# Patient Record
Sex: Female | Born: 1935 | Race: White | Hispanic: No | Smoking: Former smoker
Health system: Southern US, Community
[De-identification: ages and names within clinical notes are randomized; demographics above are authoritative.]

## PROBLEM LIST (undated history)

## (undated) DIAGNOSIS — K5790 Diverticulosis of intestine, part unspecified, without perforation or abscess without bleeding: Secondary | ICD-10-CM

## (undated) DIAGNOSIS — N952 Postmenopausal atrophic vaginitis: Secondary | ICD-10-CM

## (undated) DIAGNOSIS — K219 Gastro-esophageal reflux disease without esophagitis: Secondary | ICD-10-CM

## (undated) DIAGNOSIS — Z780 Asymptomatic menopausal state: Secondary | ICD-10-CM

## (undated) DIAGNOSIS — M549 Dorsalgia, unspecified: Secondary | ICD-10-CM

## (undated) DIAGNOSIS — M810 Age-related osteoporosis without current pathological fracture: Secondary | ICD-10-CM

## (undated) DIAGNOSIS — E079 Disorder of thyroid, unspecified: Secondary | ICD-10-CM

## (undated) DIAGNOSIS — Z9289 Personal history of other medical treatment: Secondary | ICD-10-CM

## (undated) DIAGNOSIS — I6529 Occlusion and stenosis of unspecified carotid artery: Secondary | ICD-10-CM

## (undated) DIAGNOSIS — J189 Pneumonia, unspecified organism: Secondary | ICD-10-CM

## (undated) DIAGNOSIS — R42 Dizziness and giddiness: Secondary | ICD-10-CM

## (undated) DIAGNOSIS — E039 Hypothyroidism, unspecified: Secondary | ICD-10-CM

## (undated) DIAGNOSIS — D649 Anemia, unspecified: Secondary | ICD-10-CM

## (undated) DIAGNOSIS — N390 Urinary tract infection, site not specified: Secondary | ICD-10-CM

## (undated) DIAGNOSIS — R32 Unspecified urinary incontinence: Secondary | ICD-10-CM

## (undated) HISTORY — DX: Age-related osteoporosis without current pathological fracture: M81.0

## (undated) HISTORY — DX: Unspecified urinary incontinence: R32

## (undated) HISTORY — PX: TONSILLECTOMY: SUR1361

## (undated) HISTORY — PX: EYE SURGERY: SHX253

## (undated) HISTORY — PX: CATARACT EXTRACTION W/ INTRAOCULAR LENS  IMPLANT, BILATERAL: SHX1307

## (undated) HISTORY — DX: Postmenopausal atrophic vaginitis: N95.2

## (undated) HISTORY — DX: Asymptomatic menopausal state: Z78.0

---
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
## 2001-02-05 ENCOUNTER — Other Ambulatory Visit: Admission: RE | Admit: 2001-02-05 | Discharge: 2001-02-05 | Payer: Self-pay | Admitting: Obstetrics and Gynecology

## 2001-09-30 ENCOUNTER — Ambulatory Visit (HOSPITAL_COMMUNITY): Admission: RE | Admit: 2001-09-30 | Discharge: 2001-09-30 | Payer: Self-pay | Admitting: Gastroenterology

## 2001-09-30 ENCOUNTER — Encounter (INDEPENDENT_AMBULATORY_CARE_PROVIDER_SITE_OTHER): Payer: Self-pay | Admitting: *Deleted

## 2001-09-30 HISTORY — PX: COLONOSCOPY: SHX174

## 2005-08-19 ENCOUNTER — Encounter: Payer: Self-pay | Admitting: Internal Medicine

## 2006-02-02 ENCOUNTER — Ambulatory Visit (HOSPITAL_COMMUNITY): Admission: RE | Admit: 2006-02-02 | Discharge: 2006-02-02 | Payer: Self-pay | Admitting: Ophthalmology

## 2008-02-03 ENCOUNTER — Ambulatory Visit: Payer: Self-pay | Admitting: Surgery

## 2008-02-29 ENCOUNTER — Ambulatory Visit: Payer: Self-pay | Admitting: Vascular Surgery

## 2009-02-27 ENCOUNTER — Ambulatory Visit: Payer: Self-pay | Admitting: Vascular Surgery

## 2009-08-28 ENCOUNTER — Encounter (HOSPITAL_BASED_OUTPATIENT_CLINIC_OR_DEPARTMENT_OTHER): Admission: RE | Admit: 2009-08-28 | Discharge: 2009-10-03 | Payer: Self-pay | Admitting: Internal Medicine

## 2010-03-12 ENCOUNTER — Ambulatory Visit: Payer: Self-pay | Admitting: Vascular Surgery

## 2010-05-07 ENCOUNTER — Ambulatory Visit: Payer: Self-pay | Admitting: Internal Medicine

## 2010-05-07 DIAGNOSIS — R195 Other fecal abnormalities: Secondary | ICD-10-CM | POA: Insufficient documentation

## 2010-05-07 DIAGNOSIS — D509 Iron deficiency anemia, unspecified: Secondary | ICD-10-CM | POA: Insufficient documentation

## 2010-05-07 LAB — CONVERTED CEMR LAB
Basophils Absolute: 0 10*3/uL (ref 0.0–0.1)
Basophils Relative: 0.1 % (ref 0.0–3.0)
Eosinophils Absolute: 0.2 10*3/uL (ref 0.0–0.7)
Eosinophils Relative: 3.4 % (ref 0.0–5.0)
HCT: 39.1 % (ref 36.0–46.0)
Lymphocytes Relative: 22.1 % (ref 12.0–46.0)
Lymphs Abs: 1.5 10*3/uL (ref 0.7–4.0)
Monocytes Absolute: 0.8 10*3/uL (ref 0.1–1.0)
Monocytes Relative: 11.8 % (ref 3.0–12.0)
Neutro Abs: 4.2 10*3/uL (ref 1.4–7.7)
Neutrophils Relative %: 62.6 % (ref 43.0–77.0)
Platelets: 279 10*3/uL (ref 150.0–400.0)
RBC: 4.12 M/uL (ref 3.87–5.11)

## 2010-06-25 ENCOUNTER — Ambulatory Visit: Payer: Self-pay | Admitting: Internal Medicine

## 2010-06-25 DIAGNOSIS — K5520 Angiodysplasia of colon without hemorrhage: Secondary | ICD-10-CM | POA: Insufficient documentation

## 2010-10-22 NOTE — Procedures (Signed)
Summary: Colonoscopy-Dr. Sherin Quarry   Colonoscopy  Procedure date:  09/30/2001  Findings:      Location:  [facility].  Angiodysplasia Results: Diverticulosis.         Procedures Next Due Date:    Colonoscopy: 09/2006  Colonoscopy  Procedure date:  09/30/2001  Findings:      Location:  [facility].  Angiodysplasia Results: Diverticulosis.         Procedures Next Due Date:    Colonoscopy: 09/2006 Patient Name: Teresa, Callahan MRN: [id number] Procedure Procedures: Colonoscopy CPT: 14782.  Personnel: Endoscopist: Genene Churn. Sherin Quarry, MD.  Referred By: Teena Irani. Arlyce Dice, MD.  Exam Location: Exam performed in Endoscopy Suite. Outpatient  Patient Consent: Procedure, Alternatives, Risks and Benefits discussed, consent obtained,  Indications  Increased Risk Screening: For family history of colorectal neoplasia, in  parent age at onset: 50.  History Allergies: Allergic to codeine, Premarin.  Comments: Other diagnoses: hypothyroidism, postmenopausal, asthma Current medications: Synthroid, HRT, Advair Pre-Exam Physical: Performed Sep 30, 2001. Cardio-pulmonary exam, Abdominal exam, Mental status exam WNL.  Exam Exam: Extent of exam reached: Cecum, extent intended: Cecum.  Colon retroflexion performed. Images taken. ASA Classification: II.  Monitoring: Pulse and BP monitoring, Oximetry used. Supplemental O2 given.  Colon Prep Used Golytely for colon prep. Prep results: excellent.  Fluoroscopy: Fluoroscopy was not used.  Sedation Meds: Demerol 50 mg. given IV. Versed 5 mg. given IV.  Findings ANGIODYSPLASIA (AVMs): 2 AVMs, maximum size 5 mm, non-bleeding in Cecum. ICD9: Angiodysplasia without Hemorrhage: 569.84. Comments: Image taken.  - DIVERTICULOSIS: Descending Colon to Sigmoid Colon. Not bleeding. ICD9: Diverticulosis, Colon: 562.10. Comments: No evidence of stricture, diverticulitis or active bleeding.    Comments: No tumors, polyps or  neoplastic lesions were seen. Assessment Abnormal examination, see findings above.  Diagnoses: 569.84: Angiodysplasia without Hemorrhage.  562.10: Diverticulosis, Colon.   Events  Unplanned Interventions: No intervention was required.  Unplanned Events: There were no complications. Plans Comments: Based on her family history and the fact that she has several AVMs in her cecum, I would recommend a repeat colonoscopy in 3-5 years, depending upon her overall health. Disposition: After procedure patient sent to recovery.   This report was created from the original endoscopy report, which was reviewed and signed by the above listed endoscopist.   cc:  Dara Hoyer, MD

## 2010-10-22 NOTE — Procedures (Signed)
Summary: Colonoscopy/[facility]  Colonoscopy/[facility]   Imported By: Sherian Rein 05/16/2010 07:54:41  _____________________________________________________________________  External Attachment:    Type:   Image     Comment:   External Document

## 2010-10-22 NOTE — Assessment & Plan Note (Signed)
Summary: Anemia iron def, hem pos stools (new patient)   History of Present Illness Visit Type: Initial Consult Primary GI MD: Yancey Flemings MD Primary Noam Karaffa: Buren Kos, MD Requesting Brayton Baumgartner: Buren Kos, MD Chief Complaint: Iron def anemia & hem positive stools History of Present Illness:   Soon to be 77 year old white female with asthma, hypothyroidism, carotid stenosis, chronic vertigo, GERD, and osteopenia. She is sent today regarding iron deficiency anemia and Hemoccult-positive stool. The patient was previously under the care of Dr. Tasia Catchings and tells me that she has had previous colonoscopies. She denies a history of polyps. Records to be requested. Recent evaluation with Dr. Clelia Croft for complaints of fatigue revealed anemia with a hemoglobin of 10.4. MCV 91. Iron saturation and ferritin low at 9.2% and 11.4 respectively. These were obtained may 2011. Hemoccult testing return positive. The patient has been on iron. Her stools have been dark on iron. She denies obvious bleeding. She does report decreased appetite and weight loss of about 15-17 pounds over the past year. She also reports change in bowel habits with more pasty appearing stools about 3 times per week. Occasional diarrhea for which she takes Imodium. These bowel habit changes have occurred over the past year. She does take baby aspirin 3 times per week and Aleve several times per week. She denies upper GI symptoms such as dysphagia. No abnormal pain.   GI Review of Systems    Reports acid reflux and  weight loss.      Denies abdominal pain, belching, bloating, chest pain, dysphagia with liquids, dysphagia with solids, heartburn, loss of appetite, nausea, vomiting, vomiting blood, and  weight gain.      Reports diarrhea and  heme positive stool.     Denies anal fissure, black tarry stools, change in bowel habit, constipation, diverticulosis, fecal incontinence, hemorrhoids, irritable bowel syndrome, jaundice, light color  stool, liver problems, rectal bleeding, and  rectal pain. Preventive Screening-Counseling & Management  Alcohol-Tobacco     Smoking Status: quit      Drug Use:  no.      Current Medications (verified): 1)  Synthroid 75 Mcg Tabs (Levothyroxine Sodium) .Marland Kitchen.. 1 By Mouth Once Daily 2)  Align  Caps (Probiotic Product) .Marland Kitchen.. 1 By Mouth Once Daily 3)  Qvar 40 Mcg/act Aers (Beclomethasone Dipropionate) .... Use As Directed Two Times A Day 4)  Ventolin Hfa 108 (90 Base) Mcg/act Aers (Albuterol Sulfate) .... Use As Directed Every 6 Hours As Needed 5)  Aspirin 81 Mg Tbec (Aspirin) .Marland Kitchen.. 1 Tablet By Mouth Three Times Week 6)  Caltrate 600+d Plus 600-400 Mg-Unit Tabs (Calcium Carbonate-Vit D-Min) .Marland Kitchen.. 1 By Mouth Once Daily 7)  Centrum  Tabs (Multiple Vitamins-Minerals) .Marland Kitchen.. 1 By Mouth Once Daily 8)  Prevacid 24hr 15 Mg Cpdr (Lansoprazole) .... Take 1 Capsule By Mouth Once Daily 9)  Viactiv Multi-Vitamin  Chew (Multiple Vitamins-Calcium) .Marland Kitchen.. 1 By Mouth Once Daily 10)  Vitamin C 500 Mg Tabs (Ascorbic Acid) .... Once Daily 11)  Feosol 45 Mg Tabs (Iron) .... Once Daily 12)  Aleve 220 Mg Tabs (Naproxen Sodium) .... As Needed  Allergies (verified): 1)  ! * Horse Serum  Past History:  Past Medical History: Asthma Hypothyroidism Ostropenia Carotid Stenosis Chronic Vertigo Anemia GERD  Past Surgical History: Hysterectomy  Family History: Family History of Stomach Cancer:Mother Melenoma: Father  Social History: Occupation: Retired Patient is a former smoker.  Alcohol Use - yes 2 Daily Caffeine Use 2 Illicit Drug Use - no Smoking Status:  quit  Drug Use:  no  Review of Systems       The patient complains of arthritis/joint pain, muscle pains/cramps, urination - excessive, and urine leakage.  The patient denies allergy/sinus, anemia, anxiety-new, back pain, blood in urine, breast changes/lumps, change in vision, confusion, cough, coughing up blood, depression-new, fainting, fatigue, fever,  headaches-new, hearing problems, heart murmur, heart rhythm changes, itching, menstrual pain, night sweats, nosebleeds, pregnancy symptoms, shortness of breath, skin rash, sleeping problems, sore throat, swelling of feet/legs, swollen lymph glands, thirst - excessive , urination - excessive , urination changes/pain, vision changes, and voice change.    Vital Signs:  Patient profile:   77 year old female Height:      61.5 inches Weight:      118.13 pounds BMI:     22.04 Pulse rate:   56 / minute Pulse rhythm:   regular BP sitting:   96 / 54  (left arm) Cuff size:   regular  Vitals Entered By: June McMurray CMA Duncan Dull) (May 07, 2010 9:36 AM)  Physical Exam  General:  Well developed, well nourished elderly female, no acute distress. Head:  Normocephalic and atraumatic. Eyes:  anicteric Nose:  No deformity, discharge,  or lesions. Mouth:  No deformity or lesions, Neck:  Supple; no masses or thyromegaly. Lungs:  Clear throughout to auscultation. Heart:  Regular rate and rhythm; no murmurs, rubs,  or bruits. Abdomen:  Soft, nontender and nondistended. No masses, hepatosplenomegaly or hernias noted. Normal bowel sounds. Rectal:  deferred Msk:  Symmetrical with no gross deformities. kyphotic posture Pulses:  Normal pulses noted. Extremities:  No edema or deformities noted. Neurologic:  Alert and  oriented x4;  grossly normal neurologically.   Impression & Recommendations:  Problem # 1:  ANEMIA, IRON DEFICIENCY (ICD-280.9) iron deficiency anemia with Hemoccult-positive stool. Rule out occult GI lesion such as erosions, ulcer, AVMs, or neoplasia. Weight loss somewhat concerning, though they be nonspecific in a 77 year old. We had a long discussion today regarding the possible causes for iron deficiency anemia and Hemoccult-positive stool. We also discussed the workup, including colonoscopy and upper endoscopy. The nature of the procedures as well as the risks, benefits, and alternatives  were reviewed. The discussion regarding these areas took greater than 30 minutes. She was quite apprehensive to have anything done, stating her age. I was sympathetic to this, but did emphasize that we could find easily treatable problems. At the end of the day, we agreed on the following:  Plan: #1. Followup CBC today #2. Obtain outside records from Dr. Sherin Quarry for review #3. GI followup with me in 2-4 weeks. She will consider repeat endoscopic evaluations in the interim #4. Continue iron  Problem # 2:  FECAL OCCULT BLOOD (ICD-792.1) see above discussion. Continue empirically started PPI daily  Other Orders: TLB-CBC Platelet - w/Differential (85025-[id number])  Patient Instructions: 1)  CBC ordered for you to go to basement floor and have drawn today. 2)  We will send ROI to Dr. Sherin Quarry to get old records. 3)  Please schedule a follow-up appointment in 2-4 weeks.  4)  The medication list was reviewed and reconciled.  All changed / newly prescribed medications were explained.  A complete medication list was provided to the patient / caregiver.  Appended Document: Anemia iron def, hem pos stools (new patient) COPY: Dr. Sandra Cockayne

## 2010-10-22 NOTE — Assessment & Plan Note (Signed)
Summary: Followup-iron deficiency anemia and Hemoccult-positive stool   History of Present Illness Visit Type: Follow-up Visit Primary GI MD: Yancey Flemings MD Primary Provider: Buren Kos, MD Requesting Provider: Buren Kos, MD Chief Complaint: anemia- 4 week F/U History of Present Illness:   Pleasant 77 year old white female with asthma, hypothyroidism, carotid stenosis, chronic vertigo, GERD, and osteopenia. She was evaluated in this office May 07, 2010 regarding iron deficiency anemia and Hemoccult positive stool. See that dictation for details. She had been initiated on iron therapy prior to the visit. Followup CBC that day revealed a normal hemoglobin of 13.3 with a normal MCV of 94.9. We were able to obtain outside records from Dr. Barnett Abu for review. The patient presents for followup at this time. She has continued on iron therapy. Absolutely no GI or other complaints, save urinary difficulties. Review of prior colonoscopy from 2003 and again in 2006 revealed no evidence of neoplasia. However, both reports clearly documented multiple vascular malformations of the colon in the cecum. The patient remains steadfast that she is not interested in procedural work, if at all possible.   GI Review of Systems      Denies abdominal pain, acid reflux, belching, bloating, chest pain, dysphagia with liquids, dysphagia with solids, heartburn, loss of appetite, nausea, vomiting, vomiting blood, weight loss, and  weight gain.      Reports heme positive stool.     Denies anal fissure, black tarry stools, change in bowel habit, constipation, diarrhea, diverticulosis, fecal incontinence, hemorrhoids, irritable bowel syndrome, jaundice, light color stool, liver problems, rectal bleeding, and  rectal pain.    Current Medications (verified): 1)  Synthroid 75 Mcg Tabs (Levothyroxine Sodium) .Marland Kitchen.. 1 By Mouth Once Daily 2)  Align  Caps (Probiotic Product) .Marland Kitchen.. 1 By Mouth Once Daily 3)  Qvar 40 Mcg/act Aers  (Beclomethasone Dipropionate) .... Use As Directed Two Times A Day 4)  Ventolin Hfa 108 (90 Base) Mcg/act Aers (Albuterol Sulfate) .... Use As Directed Every 6 Hours As Needed 5)  Aspirin 81 Mg Tbec (Aspirin) .Marland Kitchen.. 1 Tablet By Mouth Three Times Week Hold 6)  Caltrate 600+d Plus 600-400 Mg-Unit Tabs (Calcium Carbonate-Vit D-Min) .Marland Kitchen.. 1 By Mouth Once Daily 7)  Centrum  Tabs (Multiple Vitamins-Minerals) .Marland Kitchen.. 1 By Mouth Once Daily 8)  Prevacid 24hr 15 Mg Cpdr (Lansoprazole) .... Take 1 Capsule By Mouth Once Daily 9)  Viactiv Multi-Vitamin  Chew (Multiple Vitamins-Calcium) .Marland Kitchen.. 1 By Mouth Once Daily 10)  Vitamin C 500 Mg Tabs (Ascorbic Acid) .... Once Daily 11)  Feosol 45 Mg Tabs (Iron) .... Once Daily 12)  Aleve 220 Mg Tabs (Naproxen Sodium) .... As Needed  Allergies (verified): 1)  ! * Horse Serum  Past History:  Past Medical History: Reviewed history from 05/07/2010 and no changes required. Asthma Hypothyroidism Ostropenia Carotid Stenosis Chronic Vertigo Anemia GERD  Past Surgical History: Reviewed history from 05/07/2010 and no changes required. Hysterectomy  Family History: Reviewed history from 05/07/2010 and no changes required. Family History of Stomach Cancer:Mother Melenoma: Father  Social History: Reviewed history from 05/07/2010 and no changes required. Occupation: Retired Patient is a former smoker.  Alcohol Use - yes 2 Daily Caffeine Use 2 Illicit Drug Use - no  Review of Systems       The patient complains of anemia, arthritis/joint pain, muscle pains/cramps, urination - excessive, urination changes/pain, and urine leakage.  The patient denies allergy/sinus, anxiety-new, back pain, blood in urine, breast changes/lumps, change in vision, confusion, cough, coughing up blood, depression-new, fainting, fatigue, fever,  headaches-new, hearing problems, heart murmur, heart rhythm changes, itching, menstrual pain, night sweats, nosebleeds, pregnancy symptoms, shortness  of breath, skin rash, sleeping problems, sore throat, swelling of feet/legs, swollen lymph glands, thirst - excessive , urination - excessive , vision changes, and voice change.    Vital Signs:  Patient profile:   77 year old female Height:      61.5 inches Weight:      119.13 pounds BMI:     22.22 Pulse rate:   60 / minute Pulse rhythm:   regular BP sitting:   108 / 60  (left arm) Cuff size:   regular  Vitals Entered By: June McMurray CMA Duncan Dull) (June 25, 2010 11:23 AM)  Physical Exam  General:  Well developed, well nourished, no acute distress. Head:  Normocephalic and atraumatic. Eyes:  PERRLA, no icterus. Abdomen:  not reexamined Pulses:  Normal pulses noted. Neurologic:  Alert and  oriented x4. Skin:  Intact without significant lesions or rashes. Psych:  Alert and cooperative. Normal mood and affect.   Impression & Recommendations:  Problem # 1:  ANEMIA, IRON DEFICIENCY (ICD-51.46) 77 year old female who was seen recently for asymptomatic mild iron deficiency anemia and Hemoccult-positive stool. Good response to supplementation. Review of outside records reveals colonic AVMs. No neoplasia. AVMs the most likely reason for her iron deficiency anemia and Hemoccult-positive stool. At this point, we mutually agreed not to pursue the issue further given her good response time, lack of symptoms, age, and prior evaluations. She is pleased.  Plan: #1. Continue iron supplementation indefinitely #2. Return to the care of Dr. Clelia Croft. He can check blood counts periodically to assure sustained response to chronic iron #3. GI followup p.r.n.  Problem # 2:  FECAL OCCULT BLOOD (ICD-792.1) please see above discussion  Problem # 3:  ANGIODYSPLASIA-INTESTINE (TKZ-601.09) documented colonic AVMs on 2 prior colonoscopies, most recent 2006. Relevance to anemia and Hemoccult-positive stool as described to #1 above. Would only need to consider endoscopic ablative therapy if she fails to respond  and requiring significant transfusions or developed overt bleeding issues.  Patient Instructions: 1)  Please schedule a follow-up appointment as needed.  2)  Copy sent to : Buren Kos, MD 3)  The medication list was reviewed and reconciled.  All changed / newly prescribed medications were explained.  A complete medication list was provided to the patient / caregiver.

## 2011-02-04 NOTE — Assessment & Plan Note (Signed)
OFFICE VISIT   Teresa Callahan, Teresa Callahan  DOB:  1918/08/06                                       02/27/2009  NFAOZ#:[id number]   The patient returns for her annual check of her carotid arteries.  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  She is  essentially asymptomatic with the exception of very rare episodes of  vertigo which only have happened 2 or 3 times.  She has no hemiparesis,  aphasia, amaurosis fugax or diplopia and no syncopal episodes.  She also  has had no headaches.  She denies any chest pain, dyspnea on exertion or  claudication.  She is taking one aspirin about three times per week.   PHYSICAL EXAM:  Blood pressure 157/75, heart rate 80, respirations 12.  Her carotid pulses are 3+ bilaterally.  No bruits are heard.  Neurologic  exam is normal.  Chest clear to auscultation.  Lower extremity pulses  are 3+ at the femoral, popliteal and dorsalis pedis level.   Carotid duplex exam is unchanged with very mild left internal carotid  flow reduction.  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   We will follow her on an annual basis on the carotid protocol, check the  progression of disease on the left side and if she has any symptoms she  will notify me to see [place] at an earlier time.   Quita Skye Hart [place], M.D.  Electronically Signed   JDL/MEDQ  D:  02/27/2009  T:  02/28/2009  Job:  [id number]   cc:   Kari Baars, M.D.

## 2011-02-04 NOTE — Procedures (Signed)
CAROTID DUPLEX EXAM   INDICATION:  Carotid bruit.   HISTORY:  Diabetes:  No.  Cardiac:  No.  Hypertension:  Yes.  Smoking:  Quit over 25 years ago.  Previous Surgery:  No.  CV History:  Patient complains of left ear tinnitus.  According to the  patient, she had a previous duplex five years ago which showed a  significant amount of blockage on the right side.  Amaurosis Fugax No, Paresthesias No, Hemiparesis No                                       RIGHT             LEFT  Brachial systolic pressure:         128               126  Brachial Doppler waveforms:         Triphasic         Triphasic  Vertebral direction of flow:        Antegrade         Antegrade  DUPLEX VELOCITIES (cm/sec)  CCA peak systolic                   56                76  ECA peak systolic                   67                190  ICA peak systolic                   Occluded          97  ICA end diastolic                   Occluded          22  PLAQUE MORPHOLOGY:                  Mixed             Mixed  PLAQUE AMOUNT:                      Severe            Mild  PLAQUE LOCATION:                    Throughout ICA    Proximal ICA   IMPRESSION:  1. Cannot rule out right internal carotid artery occlusion versus      trickle flow; however, no flow was identified within the right      internal carotid artery.  2. 20-39% left internal carotid artery stenosis.   ___________________________________________  V. Charlena Cross, MD   MC/MEDQ  D:  02/03/2008  T:  02/03/2008  Job:  [id number]

## 2011-02-04 NOTE — Procedures (Signed)
CAROTID DUPLEX EXAM   INDICATION:  Carotid disease.   HISTORY:  Diabetes:  no  Cardiac:  no  Hypertension:  yes  Smoking:  previous  Previous Surgery:  no  CV History:  The patient states they hear their heartbeat in their ears  at night  Amaurosis Fugax No, Paresthesias No, Hemiparesis No                                       RIGHT             LEFT  Brachial systolic pressure:         140               128  Brachial Doppler waveforms:         normal            normal  Vertebral direction of flow:        Antegrade         Antegrade  DUPLEX VELOCITIES (cm/sec)  CCA peak systolic                   64 (resistive)    75  ECA peak systolic                   164               335  ICA peak systolic                   No flow detected  95  ICA end diastolic                                     23  PLAQUE MORPHOLOGY:                  mixed             mixed  PLAQUE AMOUNT:                      occlusive         mild/ moderate  PLAQUE LOCATION:                    ICA / ECA / CCA   ICA / ECA / CCA   IMPRESSION:  1. No significant flow patterns were detected in the right internal      carotid artery which is suggestive of a total occlusion.  2. No hemodynamically significant stenosis of the left internal      carotid artery.  3. Left external carotid artery stenosis noted.  4. No significant change in Doppler velocities when compared to the      previous exam on 02/27/2009.         ___________________________________________  Quita Skye. Hart [place], M.D.   CH/MEDQ  D:  03/13/2010  T:  03/13/2010  Job:  [id number]

## 2011-02-04 NOTE — Procedures (Signed)
CAROTID DUPLEX EXAM   INDICATION:  Follow up carotid artery disease.   HISTORY:  Diabetes:  No.  Cardiac:  No.  Hypertension:  Yes.  Smoking:  Previous.  Previous Surgery:  No.  CV History:  No, intermittent mild vertigo hears heartbeat in ears at  night.  Amaurosis Fugax No, Paresthesias No, Hemiparesis No.                                       RIGHT             LEFT  Brachial systolic pressure:         132               130  Brachial Doppler waveforms:         WNL               WNL  Vertebral direction of flow:        Antegrade         Antegrade  DUPLEX VELOCITIES (cm/sec)  CCA peak systolic                   68 (high resistance)                91  ECA peak systolic                   110               236  ICA peak systolic                   365, then 22      110  ICA end diastolic                   55, then 7        22  PLAQUE MORPHOLOGY:                  Mixed             Mixed  PLAQUE AMOUNT:                      Severe            Mild  PLAQUE LOCATION:                    ICA               ICA/ECA   IMPRESSION:  1. Right internal carotid artery shows evidence of string sign in      proximal/mid segment.  Unable to visualize flow in origin/proximal      and distal segments.  Cannot rule out occlusion.  2. Left internal carotid artery shows evidence of 20-39% stenosis.  3. Left external carotid artery stenosis.   ___________________________________________  Quita Skye Hart [place], M.D.   AS/MEDQ  D:  02/27/2009  T:  02/27/2009  Job:  [id number]

## 2011-02-04 NOTE — Consult Note (Signed)
VASCULAR SURGERY CONSULTATION   PEIGHTON, MEHRA  DOB:  Oct 26, 1917                                       02/29/2008  ZOXWR#:[id number]   The patient is a pleasant 77 year old female who is referred for  vascular consultation by Dr. Clelia Croft for carotid occlusive disease.  She  denies any history of stroke, hemispheric or non-hemispheric TIAs,  amaurosis fugax, blurred vision or syncope.  She does occasionally have  dizziness at night when arising to go to the bathroom, but this  resolves.  She was found to have some right carotid occlusive disease in  the past on ultrasound study several years ago.  She had a repeat study  May 14th of 2099 in our vascular lab ordered by Dr. Clelia Croft following the  dizziness, and this revealed total occlusion of her right internal  carotid artery and mild left internal carotid flow reduction.  She does  complain of some occasional left ear tinnitus.   PAST MEDICAL HISTORY:  Negative for diabetes, hypertension, coronary  artery disease or stroke.  She does have a history hyperlipidemia and  asthma.   PAST SURGICAL HISTORY:  1. Hysterectomy.  2. Bilateral cataract surgery.   FAMILY HISTORY:  Negative for coronary artery disease, diabetes and  stroke with a strong family history of cancer.   SOCIAL HISTORY:  She is widowed and has five children, is retired, and  lives alone.  She is independent, drives an automobile, does not use  tobacco.  Uses alcohol occasionally.   REVIEW OF SYSTEMS:  She does have urinary incontinence.  Denies any  chest pain, dyspnea on exertion, PND, orthopnea, weight loss, anorexia,  severe pulmonary problems, and negative for GI and ortho with the  exception of joint pain.   ALLERGIES:  None known.   MEDICATIONS:  She does take Aleve as well as other medications, but none  no aspirin.   PHYSICAL EXAM:  Blood pressure 120/76, heart rate is 87, respirations  are 14.  Generally, she is alert and oriented  female who appears younger  than her stated age.  She is alert and oriented x3.  Neck:  Supple.  3+  carotid pulse palpable on the left and 2+ on the right.  There is soft  bruit on the left.  Neurologic exam is normal.  No palpable adenopathy  in the neck.  Chest:  Clear to auscultation.  Cardiovascular exam  reveals a regular rhythm with no murmurs.  Abdomen:  Soft, nontender  with no palpable masses.  She has 3+ femoral, popliteal, and 2+ dorsalis  pedis pulses palpable bilaterally.   I reviewed the carotid duplex exam and agree that she does have total  occlusion of her right internal carotid artery with mild contralateral  internal carotid disease.  This will need to be followed closely on the  left since her right side is totally occluded.  No treatment is  necessary for the right side.  I have encouraged her take one aspirin  per day and we will see her in 1 year with followup carotid duplex exam  unless she develops any neurologic symptoms in the interim.   Quita Skye Hart [place], M.D.  Electronically Signed  JDL/MEDQ  D:  02/29/2008  T:  03/01/2008  Job:  [id number]   cc:   Kari Baars, M.D.

## 2011-02-07 NOTE — Op Note (Signed)
NAMEAMYRIA, KOMAR                ACCOUNT NO.:  [id number]   MEDICAL RECORD NO.:  [id number]          PATIENT TYPE:  AMB   LOCATION:  SDS                          FACILITY:  [facility]   PHYSICIAN:  Alford Highland. Rankin, M.D.   DATE OF BIRTH:  1918-02-25   DATE OF PROCEDURE:  02/02/2006  DATE OF DISCHARGE:                                 OPERATIVE REPORT   PREOPERATIVE DIAGNOSIS:  1.  Retained lens fragments - nucleus - nonmagnetic foreign body dislocated      into the posterior chamber - vitreous cavity left eye.  2.  Vitritis secondary to #1.  3.  Aphakia, left eye.   PROCEDURE:  1.  Posterior vitrectomy left eye.  2.  Removal of lens fragments - nonmagnetic foreign body via phaco      fragmentation of the vitreous cavity.  3.  Insertion of posterior chamber intraocular lens into the sulcus.   SURGEON:  Alford Highland. Rankin, M.D.   ANESTHESIA:  Local __________.   INDICATION FOR PROCEDURE:  The patient is an 77 year old woman with a dense  nuclear sclerotic cataract who developed rupture spontaneous of posterior  capsule, wound dislocation of intraocular lens fragments, rather large, in  the vitreous cavity.  Patient understands this is an attempt to remove those  vitreous cavity nonmagnetic foreign bodies as well as to clean out the  cortical material in the anterior chamber as well as the posterior chamber  and the remnants of a lens in the capsular bag.  The patient understands  this is an attempt to place an intraocular lens and understands the nature  of using a large incision, multiple sutures as well as the possibility of  using an anterior chamber lens implant in the left eye.  The patient  understands the risks of anesthesia including reoccurrence, death, loss of  vision to the eye including, but not limited, to hemorrhage, infection,  scarring, need for further surgery, no change in vision, loss of vision,  progression of disease despite intervention.  Informed signed consent was  obtained.  The patient taken to the operating room.  In the operating room,  appropriate monitoring was followed by mild sedation.  __________.   Dictation ended at this point.      Alford Highland Rankin, M.D.  Electronically Signed    GAR/MEDQ  D:  02/02/2006  T:  02/03/2006  Job:  [id number]   cc:   Allen Norris, M.D.  Fax: [phone]

## 2011-03-13 ENCOUNTER — Other Ambulatory Visit: Payer: Self-pay

## 2011-11-24 ENCOUNTER — Other Ambulatory Visit: Payer: Self-pay

## 2011-11-24 ENCOUNTER — Inpatient Hospital Stay (HOSPITAL_COMMUNITY)
Admission: EM | Admit: 2011-11-24 | Discharge: 2011-12-04 | DRG: 871 | Disposition: A | Discharge status: Skilled Nursing Facility | Payer: Medicare Other | Attending: Critical Care Medicine | Admitting: Critical Care Medicine

## 2011-11-24 ENCOUNTER — Inpatient Hospital Stay (HOSPITAL_COMMUNITY): Payer: Medicare Other

## 2011-11-24 ENCOUNTER — Encounter (HOSPITAL_COMMUNITY): Payer: Self-pay | Admitting: Emergency Medicine

## 2011-11-24 ENCOUNTER — Emergency Department (HOSPITAL_COMMUNITY): Payer: Medicare Other

## 2011-11-24 DIAGNOSIS — K219 Gastro-esophageal reflux disease without esophagitis: Secondary | ICD-10-CM | POA: Diagnosis present

## 2011-11-24 DIAGNOSIS — E039 Hypothyroidism, unspecified: Secondary | ICD-10-CM | POA: Diagnosis present

## 2011-11-24 DIAGNOSIS — I5033 Acute on chronic diastolic (congestive) heart failure: Secondary | ICD-10-CM

## 2011-11-24 DIAGNOSIS — E872 Acidosis, unspecified: Secondary | ICD-10-CM

## 2011-11-24 DIAGNOSIS — I2489 Other forms of acute ischemic heart disease: Secondary | ICD-10-CM

## 2011-11-24 DIAGNOSIS — I509 Heart failure, unspecified: Secondary | ICD-10-CM

## 2011-11-24 DIAGNOSIS — A419 Sepsis, unspecified organism: Secondary | ICD-10-CM

## 2011-11-24 DIAGNOSIS — N390 Urinary tract infection, site not specified: Secondary | ICD-10-CM

## 2011-11-24 DIAGNOSIS — R748 Abnormal levels of other serum enzymes: Secondary | ICD-10-CM

## 2011-11-24 DIAGNOSIS — R6520 Severe sepsis without septic shock: Secondary | ICD-10-CM | POA: Diagnosis present

## 2011-11-24 DIAGNOSIS — Z792 Long term (current) use of antibiotics: Secondary | ICD-10-CM

## 2011-11-24 DIAGNOSIS — R579 Shock, unspecified: Secondary | ICD-10-CM

## 2011-11-24 DIAGNOSIS — R195 Other fecal abnormalities: Secondary | ICD-10-CM

## 2011-11-24 DIAGNOSIS — R799 Abnormal finding of blood chemistry, unspecified: Secondary | ICD-10-CM | POA: Diagnosis present

## 2011-11-24 DIAGNOSIS — Z66 Do not resuscitate: Secondary | ICD-10-CM | POA: Diagnosis not present

## 2011-11-24 DIAGNOSIS — N179 Acute kidney failure, unspecified: Secondary | ICD-10-CM

## 2011-11-24 DIAGNOSIS — E860 Dehydration: Secondary | ICD-10-CM

## 2011-11-24 DIAGNOSIS — R918 Other nonspecific abnormal finding of lung field: Secondary | ICD-10-CM

## 2011-11-24 DIAGNOSIS — J9600 Acute respiratory failure, unspecified whether with hypoxia or hypercapnia: Secondary | ICD-10-CM

## 2011-11-24 DIAGNOSIS — K5520 Angiodysplasia of colon without hemorrhage: Secondary | ICD-10-CM

## 2011-11-24 DIAGNOSIS — J45909 Unspecified asthma, uncomplicated: Secondary | ICD-10-CM | POA: Diagnosis present

## 2011-11-24 DIAGNOSIS — I248 Other forms of acute ischemic heart disease: Secondary | ICD-10-CM

## 2011-11-24 DIAGNOSIS — R0902 Hypoxemia: Secondary | ICD-10-CM

## 2011-11-24 DIAGNOSIS — E871 Hypo-osmolality and hyponatremia: Secondary | ICD-10-CM

## 2011-11-24 DIAGNOSIS — D72829 Elevated white blood cell count, unspecified: Secondary | ICD-10-CM

## 2011-11-24 DIAGNOSIS — J189 Pneumonia, unspecified organism: Secondary | ICD-10-CM

## 2011-11-24 DIAGNOSIS — E46 Unspecified protein-calorie malnutrition: Secondary | ICD-10-CM | POA: Diagnosis present

## 2011-11-24 DIAGNOSIS — D509 Iron deficiency anemia, unspecified: Secondary | ICD-10-CM

## 2011-11-24 HISTORY — DX: Anemia, unspecified: D64.9

## 2011-11-24 HISTORY — DX: Dizziness and giddiness: R42

## 2011-11-24 HISTORY — DX: Diverticulosis of intestine, part unspecified, without perforation or abscess without bleeding: K57.90

## 2011-11-24 HISTORY — DX: Disorder of thyroid, unspecified: E07.9

## 2011-11-24 HISTORY — DX: Occlusion and stenosis of unspecified carotid artery: I65.29

## 2011-11-24 HISTORY — DX: Gastro-esophageal reflux disease without esophagitis: K21.9

## 2011-11-24 HISTORY — DX: Urinary tract infection, site not specified: N39.0

## 2011-11-24 LAB — PRO B NATRIURETIC PEPTIDE: Pro B Natriuretic peptide (BNP): 8742 pg/mL — ABNORMAL HIGH (ref 0–450)

## 2011-11-24 LAB — POCT I-STAT TROPONIN I: Troponin i, poc: 0.39 ng/mL (ref 0.00–0.08)

## 2011-11-24 LAB — CARDIAC PANEL(CRET KIN+CKTOT+MB+TROPI)
CK, MB: 5.3 ng/mL — ABNORMAL HIGH (ref 0.3–4.0)
Total CK: 30 U/L (ref 7–177)

## 2011-11-24 LAB — BLOOD GAS, ARTERIAL
Drawn by: 103701
O2 Content: 3 L/min
pCO2 arterial: 34.4 mmHg — ABNORMAL LOW (ref 35.0–45.0)
pH, Arterial: 7.396 (ref 7.350–7.400)

## 2011-11-24 LAB — COMPREHENSIVE METABOLIC PANEL
ALT: 72 U/L — ABNORMAL HIGH (ref 0–35)
Alkaline Phosphatase: 183 U/L — ABNORMAL HIGH (ref 39–117)
CO2: 21 mEq/L (ref 19–32)
Calcium: 10.2 mg/dL (ref 8.4–10.5)
Creatinine, Ser: 1.46 mg/dL — ABNORMAL HIGH (ref 0.50–1.10)
GFR calc non Af Amer: 30 mL/min — ABNORMAL LOW (ref >90)
Potassium: 4.1 mEq/L (ref 3.5–5.1)
Sodium: 129 mEq/L — ABNORMAL LOW (ref 135–145)
Total Protein: 6.5 g/dL (ref 6.0–8.3)

## 2011-11-24 LAB — DIFFERENTIAL
Eosinophils Absolute: 0 10*3/uL (ref 0.0–0.7)
Eosinophils Relative: 0 % (ref 0–5)
Lymphocytes Relative: 4 % — ABNORMAL LOW (ref 12–46)
Lymphs Abs: 0.9 10*3/uL (ref 0.7–4.0)
Monocytes Absolute: 1.1 10*3/uL — ABNORMAL HIGH (ref 0.1–1.0)
Neutrophils Relative %: 91 % — ABNORMAL HIGH (ref 43–77)

## 2011-11-24 LAB — URINALYSIS, ROUTINE W REFLEX MICROSCOPIC
Bilirubin Urine: NEGATIVE
Hgb urine dipstick: NEGATIVE
pH: 5 (ref 5.0–8.0)

## 2011-11-24 LAB — LACTIC ACID, PLASMA
Lactic Acid, Venous: 6.5 mmol/L — ABNORMAL HIGH (ref 0.5–2.2)
Lactic Acid, Venous: 4.1 mmol/L — ABNORMAL HIGH (ref 0.5–2.2)

## 2011-11-24 LAB — CBC
MCH: 32 pg (ref 26.0–34.0)
Platelets: 321 10*3/uL (ref 150–400)
RDW: 15.5 % (ref 11.5–15.5)

## 2011-11-24 LAB — CARBOXYHEMOGLOBIN
Carboxyhemoglobin: 1.1 % (ref 0.5–1.5)
Methemoglobin: 1.5 % (ref 0.0–1.5)
O2 Saturation: 68.7 %
Total hemoglobin: 13.4 g/dL (ref 12.5–16.0)
Total hemoglobin: 10 g/dL — ABNORMAL LOW (ref 12.5–16.0)

## 2011-11-24 MED ORDER — CEFTRIAXONE SODIUM 1 G IJ SOLR: 1.0000 g | Freq: Once | INTRAMUSCULAR | Status: DC

## 2011-11-24 MED ORDER — NOREPINEPHRINE BITARTRATE 1 MG/ML IJ SOLN
2.0000 ug/min | INTRAVENOUS | Status: DC
  Administered 2011-11-25: 5 ug/min via INTRAVENOUS
  Administered 2011-11-25: 50 ug/min via INTRAVENOUS
  Administered 2011-11-25: 40 ug/min via INTRAVENOUS
  Filled 2011-11-24 (×2): qty 4

## 2011-11-24 MED ORDER — ALBUTEROL SULFATE HFA 108 (90 BASE) MCG/ACT IN AERS
2.0000 | INHALATION_SPRAY | Freq: Four times a day (QID) | RESPIRATORY_TRACT | Status: DC | PRN
  Administered 2011-11-24: 2 via RESPIRATORY_TRACT
  Filled 2011-11-24: qty 6.7

## 2011-11-24 MED ORDER — LEVOFLOXACIN IN D5W 750 MG/150ML IV SOLN
750.0000 mg | INTRAVENOUS | Status: DC
  Administered 2011-11-24: 750 mg via INTRAVENOUS
  Filled 2011-11-24 (×2): qty 150

## 2011-11-24 MED ORDER — PANTOPRAZOLE SODIUM 40 MG IV SOLR
40.0000 mg | Freq: Every day | INTRAVENOUS | Status: DC
  Administered 2011-11-24 – 2011-11-27 (×4): 40 mg via INTRAVENOUS
  Filled 2011-11-24 (×5): qty 40

## 2011-11-24 MED ORDER — SODIUM CHLORIDE 0.9 % IV BOLUS (SEPSIS)
1000.0000 mL | Freq: Once | INTRAVENOUS | Status: AC
  Administered 2011-11-24: 1000 mL via INTRAVENOUS

## 2011-11-24 MED ORDER — ASPIRIN 81 MG PO CHEW
324.0000 mg | CHEWABLE_TABLET | ORAL | Status: AC
  Administered 2011-11-24: 324 mg via ORAL
  Filled 2011-11-24: qty 4

## 2011-11-24 MED ORDER — DEXTROSE 5 % IV SOLN
1.0000 g | INTRAVENOUS | Status: DC
  Administered 2011-11-25 – 2011-12-01 (×7): 1 g via INTRAVENOUS
  Filled 2011-11-24 (×8): qty 10

## 2011-11-24 MED ORDER — SODIUM CHLORIDE 0.9 % IV SOLN: 750.0000 mL | INTRAVENOUS | Status: DC | PRN

## 2011-11-24 MED ORDER — ASPIRIN 300 MG RE SUPP
300.0000 mg | RECTAL | Status: AC
  Filled 2011-11-24: qty 1

## 2011-11-24 MED ORDER — HEPARIN SODIUM (PORCINE) 5000 UNIT/ML IJ SOLN
5000.0000 [IU] | Freq: Three times a day (TID) | INTRAMUSCULAR | Status: DC
  Administered 2011-11-24 – 2011-12-04 (×29): 5000 [IU] via SUBCUTANEOUS
  Filled 2011-11-24 (×32): qty 1

## 2011-11-24 MED ORDER — DEXTROSE 5 % IV SOLN
1.0000 g | Freq: Once | INTRAVENOUS | Status: AC
  Administered 2011-11-24: 1 g via INTRAVENOUS
  Filled 2011-11-24: qty 10

## 2011-11-24 MED ORDER — DEXTROSE 5 % IV SOLN
30.0000 ug/min | INTRAVENOUS | Status: DC
  Administered 2011-11-24: 60 ug/min via INTRAVENOUS
  Administered 2011-11-24: 70 ug/min via INTRAVENOUS
  Administered 2011-11-24: 30 ug/min via INTRAVENOUS
  Filled 2011-11-24: qty 1

## 2011-11-24 MED ORDER — HYDROCORTISONE SOD SUCCINATE 100 MG IJ SOLR
50.0000 mg | Freq: Four times a day (QID) | INTRAMUSCULAR | Status: DC
  Administered 2011-11-24 – 2011-11-25 (×2): 50 mg via INTRAVENOUS
  Filled 2011-11-24 (×3): qty 1
  Filled 2011-11-24 (×2): qty 2
  Filled 2011-11-24: qty 1

## 2011-11-24 MED ORDER — SODIUM CHLORIDE 0.9 % IV SOLN: 250.0000 mL | INTRAVENOUS | Status: DC | PRN

## 2011-11-24 MED ORDER — LEVOFLOXACIN IN D5W 750 MG/150ML IV SOLN
750.0000 mg | INTRAVENOUS | Status: DC
  Filled 2011-11-24: qty 150

## 2011-11-24 MED ORDER — PHENYLEPHRINE HCL 10 MG/ML IJ SOLN
30.0000 ug/min | INTRAVENOUS | Status: DC
  Administered 2011-11-25: 70 ug/min via INTRAVENOUS
  Filled 2011-11-24: qty 2

## 2011-11-24 MED ORDER — MAGNESIUM SULFATE 50 % IJ SOLN
2.0000 g | Freq: Once | INTRAVENOUS | Status: AC
  Administered 2011-11-24: 2 g via INTRAVENOUS
  Filled 2011-11-24: qty 4

## 2011-11-24 MED ORDER — LEVOTHYROXINE SODIUM 75 MCG PO TABS
75.0000 ug | ORAL_TABLET | Freq: Every day | ORAL | Status: DC
  Administered 2011-11-24 – 2011-11-25 (×2): 75 ug via ORAL
  Filled 2011-11-24 (×2): qty 1

## 2011-11-24 MED ORDER — MOXIFLOXACIN HCL IN NACL 400 MG/250ML IV SOLN
400.0000 mg | Freq: Once | INTRAVENOUS | Status: AC
  Administered 2011-11-24: 400 mg via INTRAVENOUS
  Filled 2011-11-24: qty 250

## 2011-11-24 NOTE — ED Notes (Signed)
Received call regarding pt's troponin 0.48 Md. Babcock at bedside and notified and aware

## 2011-11-24 NOTE — Progress Notes (Signed)
eLink Physician-Brief Progress Note Patient Name: Teresa Callahan DOB: 1918-02-19 MRN: [id number]  Date of Service  11/24/2011   HPI/Events of Note  COOX of 69, on neo gtt BP of 108/40 MAP of 57, O2 sats of 79 but unsure how accurate these are.  Last ABG however was pO2 of 54 with sats on ABG of 84.   eICU Interventions  Plan: Check ABG Place on BiPAP in meantime and see if patient will tolerate Talk with patient/family re intubation   Intervention Category Major Interventions: Sepsis - evaluation and management  DETERDING,ELIZABETH 11/24/2011, 11:36 PM

## 2011-11-24 NOTE — Progress Notes (Signed)
eLink Physician-Brief Progress Note Patient Name: Teresa Callahan DOB: Oct 15, 1917 MRN: [id number]  Date of Service  11/24/2011   HPI/Events of Note  cvp 9 Map 64 Pct 3s cooox 50s  Lab 11/24/11 1130  HGB 11.7*   SIRS + with high WBC + abnromal UA .Lactate 6 -> 4  Lab 11/24/11 1130  PROCALCITON 3.28     Lab 11/24/11 1130  TROPONINI 0.48*    Lab 11/24/11 1221  PROBNP 8742.0*     eICU Interventions  Features c/w occult septic shock  PLAN Fluid bolus to get cvp >12, if map not > 65 after this start egdt   Intervention Category Major Interventions: Other:  [name] 11/24/2011, 6:12 PM

## 2011-11-24 NOTE — Procedures (Signed)
Central Venous Catheter Insertion Procedure Note Teresa Callahan [id number] 1918/09/21  Procedure: Insertion of Central Venous Catheter Indications: Assessment of intravascular volume, Drug and/or fluid administration and Frequent blood sampling  Procedure Details Consent: Risks of procedure as well as the alternatives and risks of each were explained to the (patient/caregiver).  Consent for procedure obtained. Time Out: Verified patient identification, verified procedure, site/side was marked, verified correct patient position, special equipment/implants available, medications/allergies/relevent history reviewed, required imaging and test results available.  Performed  Maximum sterile technique was used including antiseptics, cap, gloves, gown, hand hygiene, mask and sheet. Skin prep: Chlorhexidine; local anesthetic administered A antimicrobial bonded/coated triple lumen catheter was placed in the right internal jugular vein using the Seldinger technique.  Evaluation Blood flow good Complications: No apparent complications Patient did tolerate procedure well. Chest X-ray ordered to verify placement.  CXR: pending.  BABCOCK,PETE 11/24/2011, 5:12 PM   Levy Pupa, MD, PhD 11/24/2011, 5:18 PM [facility] [phone] or if no answer [phone]

## 2011-11-24 NOTE — ED Notes (Signed)
HQI:ON62<XB> Expected date:11/24/11<BR> Expected time:10:53 AM<BR> Means of arrival:Ambulance<BR> Comments:<BR> M41. 79 yo f. Sick, weak, UTI symptoms and being treated for same. 10 mins

## 2011-11-24 NOTE — H&P (Signed)
Name: Teresa Callahan MRN: [id number] DOB: 26-Dec-1917    LOS: 0  [place] [facility]  History of Present Illness:  This is a highly functional 19 YOWF, who is followed by Urology for chronic UTI. Reports last seen by Dr Sherron Monday on 2/26 for follow up re: urinary  Incontinence. At this time she was placed on nitrofurantoin for possible UTI. She reported spending the greater part of the days on 2/26 thru 3/2 in bed due to nausea, weakness and inability to take in fluids, as well as light headedness when she would try to stand. Per her family she rallied briefly on the am of 3/3, then that afternoon began to develop dry non-productive cough. This was associated with progressive Shortness of breath and temp of 100 the evening of 3/3. She Presented to the ER on 3/4 with resting dyspnea, O2 sats  leukocytosis, lactic acid of 6.5 and CXR with diffuse pulm infiltrates. She was placed on oxygen, given IVFs and empiric antibiotics. PCCM was asked to eval.    Lines / Drains:   Cultures: PCT 3/4>>> Lactate 3/4: 6.5>>> Sputum 3/4>>> BC X2 3/4>>> Urine strep 3/4>>> Urine legionella 3/4>>> Influenza PCR 3/4>>>  Antibiotics: levaquin 3/4 (Cover CAP and UTI)>>> Rocephin 3/4 (CAP)>>>   Tests / Events:    Past Medical History  Diagnosis Date  . UTI (lower urinary tract infection)   . Asthma   . Thyroid disease   . Diverticulosis   . Anemia   . Carotid stenosis   . Vertigo   . GERD (gastroesophageal reflux disease)    Past Surgical History  Procedure Date  . Abdominal hysterectomy   . Left eye surgery 02/02/2006  . Colonoscopy 09/30/2001   Prior to Admission medications   Medication Sig Start Date End Date Taking? Authorizing Provider  albuterol (PROVENTIL HFA;VENTOLIN HFA) 108 (90 BASE) MCG/ACT inhaler Inhale 2 puffs into the lungs every 6 (six) hours as needed. For shortness of breath   Yes Historical Provider, MD  beclomethasone (QVAR) 80 MCG/ACT inhaler Inhale 1 puff into  the lungs as needed.   Yes Historical Provider, MD  calcium citrate (CALCITRATE - DOSED IN MG ELEMENTAL CALCIUM) 950 MG tablet Take 1 tablet by mouth daily.   Yes Historical Provider, MD  lansoprazole (PREVACID) 15 MG capsule Take 15 mg by mouth daily.   Yes Historical Provider, MD  levothyroxine (SYNTHROID, LEVOTHROID) 75 MCG tablet Take 75 mcg by mouth daily.   Yes Historical Provider, MD  Multiple Vitamin (MULITIVITAMIN WITH MINERALS) TABS Take 1 tablet by mouth daily.   Yes Historical Provider, MD  naproxen sodium (ANAPROX) 220 MG tablet Take 220 mg by mouth 2 (two) times daily with a meal.   Yes Historical Provider, MD  nitrofurantoin, macrocrystal-monohydrate, (MACROBID) 100 MG capsule Take 100 mg by mouth 2 (two) times daily.   Yes Historical Provider, MD  phenazopyridine (PYRIDIUM) 100 MG tablet Take 100 mg by mouth every 8 (eight) hours as needed. As needed for burning with urination   Yes Historical Provider, MD  Probiotic Product (ALIGN) 4 MG CAPS Take 1 capsule by mouth daily.   Yes Historical Provider, MD  solifenacin (VESICARE) 5 MG tablet Take 10 mg by mouth daily.   Yes Historical Provider, MD   Allergies Allergies  Allergen Reactions  . Horse-Derived Products Other (See Comments)    Loss of appetite  . Sulfa Antibiotics Other (See Comments)    Loss of appetite     Family History History reviewed. No pertinent family  history.  Social History  reports that she has quit smoking. She has never used smokeless tobacco. She reports that she does not drink alcohol or use illicit drugs.  Review Of Systems  11 points review of systems is negative with an exception of listed in HPI.  Vital Signs: Temp:  [98.2 F (36.8 C)-98.5 F (36.9 C)] 98.5 F (36.9 C) (03/04 1222) Pulse Rate:  [79-90] 79  (03/04 1222) Resp:  [18-22] 22  (03/04 1222) BP: (83-86)/(46-47) 86/46 mmHg (03/04 1222) SpO2:  [92 %] 92 % (03/04 1222)        Physical Examination: General: 78 year old female,  currently in no acute distress, but markedly hypoxic, requiring High levels of FIO2 Neuro:  Awake and oriented w/out any focal deficits HEENT:  Neck veins flat, mm dry  Cardiovascular: rrr Lungs:  Crackles posterior bases Abdomen:  Non-tender, + bowel sounds Musculoskeletal:  Intact.   Ventilator settings:    Labs and Imaging:    Lab 11/24/11 1130  NA 129*  K 4.1  CL 90*  CO2 21  BUN 39*  CREATININE 1.46*  GLUCOSE 116*    Lab 11/24/11 1130  HGB 11.7*  HCT 33.4*  WBC 22.3*  PLT 321    Lab 11/24/11 1130  CKTOTAL 30  CKMB 5.3*  TROPONINI 0.48*     Lab 11/24/11 1221  PROBNP 8742.0*  PCXR: bilateral pulmonary infiltrates.   Assessment and Plan:  Circulatory shock: suspect primarily hypovolemia/dehydration, but initially met SIRS/Sepsis criteria on basis of metabolic acidosis and leukocytosis. Potential sources include: UTI and Lung. Does have evidence of demand ischemia (elevated CEs), but do not think this is causative factor: Seems to be responding to IVF challenge.   Lab 11/24/11 1130  PROCALCITON 3.28  WBC 22.3*  LATICACIDVEN 6.5*  Plan: -complete 2nd liter of NS (currently up), recheck stat lactate. If lactate not cleared by at least 10% OR remains hypotensive will initiate EGDT protocol -cont IVFs -check Cortisol -has already gotten antibiotics/ agree we should continue empiric coverage for now -check echo  Acute respiratory failure in the setting bilateral infiltrates: Diff dx: ALI in setting of UTI, CAP, pneumonitis (possible drug induced: Nitrofurantoin vs aspiration), influenza or possibly pulmonary edema Plan: -wean FIO2, sats > 92% -scheduled Nebs -pan culture -check ESR -empiric CAP coverage  Elevated Cardiac enzymes/demand ischemia Plan: -echo -trend CEs -will consider cards involvement -asa daily  Lactic acidosis: d/t shock Plan: -recheck, see above   Hyponatremia: this is most likely d/t hypovolemia  Lab 11/24/11 1130  NA 129*    plan: -Hydrate -f/u chemistry   Acute renal failure Recent Labs  [facility] 11/24/11 1130   CREATININE 1.46*  plan -hydrate -f/u chemistry  Chronic UTI:  Plan -pan culture -have asked Urology to see: specifically including any micro data they may have.   Hypothyroidism Plan -check TSH -cont synthroid   Best practices / Disposition: -->ICU status under PCCM -->full code -->Heparin for DVT Px -->Protonix for GI Px -->diet -->family updated at bedside   The patient is critically ill with multiple organ systems failure and requires high complexity decision making for assessment and support, frequent evaluation and titration of therapies, application of advanced monitoring technologies and extensive interpretation of multiple databases. Critical Care Time devoted to patient care services described in this note is 45 minutes.  BABCOCK,PETE 11/24/2011, 1:48 PM  Levy Pupa, MD, PhD 11/24/2011, 5:13 PM [facility] [phone] or if no answer [phone]

## 2011-11-24 NOTE — ED Notes (Signed)
Pulmonary care at bedside

## 2011-11-24 NOTE — ED Notes (Signed)
Last week was placed on another antibioitc for uti for having uti for the 7 months now, states that she has not been able to eat , some burning on urination

## 2011-11-24 NOTE — ED Provider Notes (Signed)
History     CSN: [id number]  Arrival date & time 11/24/11  1105   First MD Initiated Contact with Patient 11/24/11 1118      Chief Complaint  Patient presents with  . Fatigue   elderly female, brought in by EMS accompanied by her daughter. The daughter states that mom has been on antibiotics for urinary tract infection off and on for the past 7 months. She was seen by the urologist. One week ago and was put on nitrofurantoin. She initially was thought to have been improving however, began having diffuse malaise and fatigue. Yesterday. The daughter states she had low-grade temperature yesterday. She has had some urinary discomfort, as well as decreased appetite. She has no bodily pain. She has had a mild dry cough and minimal shortness of breath. No chest pain. No back pain. No syncope or dizziness.  (Consider location/radiation/quality/duration/timing/severity/associated sxs/prior treatment) HPI  Past Medical History  Diagnosis Date  . UTI (lower urinary tract infection)   . Asthma   . Thyroid disease     No past surgical history on file.  No family history on file.  History  Substance Use Topics  . Smoking status: Not on file  . Smokeless tobacco: Not on file  . Alcohol Use:     OB History    Grav Para Term Preterm Abortions TAB SAB Ect Mult Living                  Review of Systems  Constitutional: Positive for fatigue.  Respiratory: Negative for shortness of breath.   Genitourinary: Negative for flank pain.  Neurological: Negative for syncope.  All other systems reviewed and are negative.    Allergies  Horse-derived products and Sulfa antibiotics  Home Medications   Current Outpatient Rx  Name Route Sig Dispense Refill  . ALBUTEROL SULFATE HFA 108 (90 BASE) MCG/ACT IN AERS Inhalation Inhale 2 puffs into the lungs every 6 (six) hours as needed. For shortness of breath    . BECLOMETHASONE DIPROPIONATE 80 MCG/ACT IN AERS Inhalation Inhale 1 puff into the lungs  as needed.    Marland Kitchen CALCIUM CITRATE 950 MG PO TABS Oral Take 1 tablet by mouth daily.    Marland Kitchen LANSOPRAZOLE 15 MG PO CPDR Oral Take 15 mg by mouth daily.    Marland Kitchen LEVOTHYROXINE SODIUM 75 MCG PO TABS Oral Take 75 mcg by mouth daily.    . ADULT MULTIVITAMIN W/MINERALS CH Oral Take 1 tablet by mouth daily.    Marland Kitchen NAPROXEN SODIUM 220 MG PO TABS Oral Take 220 mg by mouth 2 (two) times daily with a meal.    . NITROFURANTOIN MONOHYD MACRO 100 MG PO CAPS Oral Take 100 mg by mouth 2 (two) times daily.    Marland Kitchen PHENAZOPYRIDINE HCL 100 MG PO TABS Oral Take 100 mg by mouth every 8 (eight) hours as needed. As needed for burning with urination    . ALIGN 4 MG PO CAPS Oral Take 1 capsule by mouth daily.    Marland Kitchen SOLIFENACIN SUCCINATE 5 MG PO TABS Oral Take 10 mg by mouth daily.      BP 83/47  Pulse 90  Temp 98.2 F (36.8 C)  Resp 18  Physical Exam  Nursing note and vitals reviewed. Constitutional: She is oriented to person, place, and time. She appears well-developed and well-nourished. No distress.  HENT:  Head: Normocephalic.       Mm dry   Eyes: Pupils are equal, round, and reactive to light.  Neck:  Neck supple. No JVD present.  Cardiovascular: Exam reveals no gallop and no friction rub.   No murmur heard. Pulmonary/Chest: Breath sounds normal. No respiratory distress. She has no wheezes. She has no rales. She exhibits no tenderness.  Abdominal: Soft. She exhibits no distension. There is no tenderness. There is no rebound.  Musculoskeletal: Normal range of motion.  Lymphadenopathy:    She has no cervical adenopathy.  Neurological: She is alert and oriented to person, place, and time. She displays normal reflexes. No cranial nerve deficit. She exhibits normal muscle tone. Coordination normal.  Skin: Skin is warm and dry.  Psychiatric: She has a normal mood and affect.    ED Course  Procedures (including critical care time)   Labs Reviewed  URINALYSIS, ROUTINE W REFLEX MICROSCOPIC  CBC  DIFFERENTIAL    COMPREHENSIVE METABOLIC PANEL  LACTIC ACID, PLASMA  PROCALCITONIN  CULTURE, BLOOD (ROUTINE X 2)  CULTURE, BLOOD (ROUTINE X 2)  URINE CULTURE   No results found.   No diagnosis found.    MDM  Pt is seen and examined;  Initial history and physical completed.  Will follow.     Date: 11/24/2011  Rate: 84  Rhythm: normal sinus rhythm  QRS Axis: normal  Intervals: normal  ST/T Wave abnormalities: nonspecific ST/T changes  Conduction Disutrbances:right bundle branch block  Narrative Interpretation:   Old EKG Reviewed: changes noted No acute ischemic changes.   Elevated troponin of 0.39 noted from point of care testing. EKG shows a chronic right bundle branch block, but no signs of any acute ischemia. No criteria for STEMI. Patient's white blood cell count was elevated at 22,000. The remainder of the lab studies are pending. We'll obtain a cardiac panel and continue to follow closely. Patient has no bodily pain. At this time.    12:22 PM  Lactic acid, noted to be elevated at 6.5. As such empiric antibiotics have been ordered for UTI and probable pneumonia. Pulmonary critical care service has been paged. Urine was positive for bacteria and leukocytes. Troponin 0.39. White blood cell count elevated at 22.3.   Chest x-ray shows diffuse bilateral airspace disease, may be due to edema or pneumonia. Bilateral pleural effusions noted as well.    Will hold off IV fluids at this time. Initially given for blood pressure in the 70s. We'll reassess.      Eashan Schipani A. Patrica Duel, MD 11/24/11 1226

## 2011-11-24 NOTE — Progress Notes (Signed)
eLink Physician-Brief Progress Note Patient Name: Teresa Callahan DOB: [birth year] MRN: [id number]  Date of Service  11/24/2011   HPI/Events of Note   CVP 6 after 1L fluid bolus (this is total 2.5L since er arrival) MAP 51 with sbp 88  On camera Mild tachypnea but no accc muscule use and looks comfortable HR 78  eICU Interventions  Start egdt sepsis protocol  - cvp goal > 10  - map goal > 65  - once above achieved aim for coox goal (coox currently low)   Intervention Category Major Interventions: Sepsis - evaluation and management;Shock - evaluation and management  Mairim Bade 11/24/2011, 8:43 PM

## 2011-11-24 NOTE — Progress Notes (Signed)
ANTIBIOTIC CONSULT NOTE - INITIAL  Pharmacy Consult for Levaquin/Ceftriaxone Indication: SIRS (UTI vs PNA)   Allergies  Allergen Reactions  . Horse-Derived Products Other (See Comments)    Loss of appetite  . Sulfa Antibiotics Other (See Comments)    Loss of appetite     Patient Measurements:    Vital Signs: Temp: 98.5 F (36.9 C) (03/04 1222) Temp src: Oral (03/04 1222) BP: 86/46 mmHg (03/04 1222) Pulse Rate: 79  (03/04 1222) Intake/Output from previous day:   Intake/Output from this shift: Total I/O In: 1000 [I.V.:1000] Out: -   Labs:  Basename 11/24/11 1130  WBC 22.3*  HGB 11.7*  PLT 321  LABCREA --  CREATININE 1.46*   The CrCl is unknown because both a height and weight (above a minimum accepted value) are required for this calculation. No results found for this basename: VANCOTROUGH:2,VANCOPEAK:2,VANCORANDOM:2,GENTTROUGH:2,GENTPEAK:2,GENTRANDOM:2,TOBRATROUGH:2,TOBRAPEAK:2,TOBRARND:2,AMIKACINPEAK:2,AMIKACINTROU:2,AMIKACIN:2, in the last 72 hours   Microbiology: No results found for this or any previous visit (from the past 720 hour(s)).  Medical History: Past Medical History  Diagnosis Date  . UTI (lower urinary tract infection)   . Asthma   . Thyroid disease   . Diverticulosis   . Anemia   . Carotid stenosis   . Vertigo   . GERD (gastroesophageal reflux disease)    Assessment:  74 YOF with h/o chronic UTI was prescribed nitrofurantion for possible UTI 2/26.  Admitted 3/4 with dyspnea, leukocytosis, elevated lactic acid and CXR with diffused pulm infiltrates.   Patient received Ceftriaxone 1gm and Avelox 400 mg x 1 at noon today.  CCM on board, consulted pharmacy for dosing of Levaquin and Ceftriaxone  Pending sputum, urine and blood culture x 2  ARF with Scr of 1.46, patient reported wt of 105 lbs (47.7 kg)  CrCl ~ 20 ml/min  Goal of Therapy:  Appropriate renal dosing of Levaquin  Plan:   Ceftriaxone 1gm IV q24h, first dose tomorrow at  1200  Levaquin 750 mg IV q48 hours.  Will start first dose tonight.  Geoffry Paradise Thi 11/24/2011,3:51 PM

## 2011-11-25 DIAGNOSIS — J9600 Acute respiratory failure, unspecified whether with hypoxia or hypercapnia: Secondary | ICD-10-CM

## 2011-11-25 DIAGNOSIS — I509 Heart failure, unspecified: Secondary | ICD-10-CM

## 2011-11-25 LAB — BASIC METABOLIC PANEL
BUN: 27 mg/dL — ABNORMAL HIGH (ref 6–23)
CO2: 19 mEq/L (ref 19–32)
Chloride: 97 mEq/L (ref 96–112)
Creatinine, Ser: 0.94 mg/dL (ref 0.50–1.10)
Glucose, Bld: 168 mg/dL — ABNORMAL HIGH (ref 70–99)
Potassium: 3.4 mEq/L — ABNORMAL LOW (ref 3.5–5.1)

## 2011-11-25 LAB — INFLUENZA PANEL BY PCR (TYPE A & B)
H1N1 flu by pcr: NOT DETECTED
Influenza A By PCR: NEGATIVE
Influenza B By PCR: NEGATIVE

## 2011-11-25 LAB — BLOOD GAS, ARTERIAL
Acid-base deficit: 5.8 mmol/L — ABNORMAL HIGH (ref 0.0–2.0)
Bicarbonate: 18.3 mEq/L — ABNORMAL LOW (ref 20.0–24.0)
Bicarbonate: 20.2 mEq/L (ref 20.0–24.0)
Delivery systems: POSITIVE
Delivery systems: POSITIVE
Drawn by: 129801
Expiratory PAP: 5
Expiratory PAP: 5
FIO2: 1 %
FIO2: 1 %
Inspiratory PAP: 10
O2 Saturation: 98.9 %
O2 Saturation: 95.9 %
Patient temperature: 98.6
Patient temperature: 98.6
TCO2: 14.7 mmol/L (ref 0–100)
TCO2: 16.9 mmol/L (ref 0–100)
pCO2 arterial: 36.1 mmHg (ref 35.0–45.0)
pCO2 arterial: 33.8 mmHg — ABNORMAL LOW (ref 35.0–45.0)
pH, Arterial: 7.254 — ABNORMAL LOW (ref 7.350–7.400)
pH, Arterial: 7.393 (ref 7.350–7.400)
pO2, Arterial: 74.2 mmHg — ABNORMAL LOW (ref 80.0–100.0)

## 2011-11-25 LAB — CARDIAC PANEL(CRET KIN+CKTOT+MB+TROPI)
CK, MB: 7.9 ng/mL (ref 0.3–4.0)
CK, MB: 5.4 ng/mL — ABNORMAL HIGH (ref 0.3–4.0)
Total CK: 51 U/L (ref 7–177)
Total CK: 37 U/L (ref 7–177)
Troponin I: 0.58 ng/mL (ref <0.30)

## 2011-11-25 LAB — URINALYSIS, ROUTINE W REFLEX MICROSCOPIC
Ketones, ur: NEGATIVE mg/dL
Leukocytes, UA: NEGATIVE
Nitrite: NEGATIVE
Protein, ur: NEGATIVE mg/dL
pH: 5.5 (ref 5.0–8.0)

## 2011-11-25 LAB — CBC
HCT: 31.4 % — ABNORMAL LOW (ref 36.0–46.0)
Hemoglobin: 10.9 g/dL — ABNORMAL LOW (ref 12.0–15.0)
MCH: 31.3 pg (ref 26.0–34.0)
MCHC: 34.7 g/dL (ref 30.0–36.0)
MCV: 90.2 fL (ref 78.0–100.0)

## 2011-11-25 LAB — TSH: TSH: 2.513 u[IU]/mL (ref 0.350–4.500)

## 2011-11-25 LAB — STREP PNEUMONIAE URINARY ANTIGEN: Strep Pneumo Urinary Antigen: NEGATIVE

## 2011-11-25 LAB — URINE CULTURE
Culture  Setup Time: 201303050121
Culture: NO GROWTH

## 2011-11-25 LAB — PROCALCITONIN: Procalcitonin: 2.08 ng/mL

## 2011-11-25 MED ORDER — SODIUM BICARBONATE 8.4 % IV SOLN
100.0000 meq | Freq: Once | INTRAVENOUS | Status: AC
  Administered 2011-11-25: 100 meq via INTRAVENOUS
  Filled 2011-11-25: qty 100

## 2011-11-25 MED ORDER — CHLORHEXIDINE GLUCONATE 0.12 % MT SOLN
15.0000 mL | Freq: Two times a day (BID) | OROMUCOSAL | Status: DC
  Administered 2011-11-25 – 2011-12-04 (×19): 15 mL via OROMUCOSAL
  Filled 2011-11-25 (×21): qty 15

## 2011-11-25 MED ORDER — LEVOTHYROXINE SODIUM 100 MCG IV SOLR
37.5000 ug | Freq: Every day | INTRAVENOUS | Status: DC
  Filled 2011-11-25: qty 1.9

## 2011-11-25 MED ORDER — METHYLPREDNISOLONE SODIUM SUCC 125 MG IJ SOLR
80.0000 mg | Freq: Two times a day (BID) | INTRAMUSCULAR | Status: DC
  Administered 2011-11-25 – 2011-12-01 (×13): 80 mg via INTRAVENOUS
  Filled 2011-11-25 (×11): qty 1.28
  Filled 2011-11-25: qty 2
  Filled 2011-11-25 (×2): qty 1.28

## 2011-11-25 MED ORDER — NOREPINEPHRINE BITARTRATE 1 MG/ML IJ SOLN
2.0000 ug/min | INTRAVENOUS | Status: DC
  Administered 2011-11-25: 50 ug/min via INTRAVENOUS
  Administered 2011-11-25: 30 ug/min via INTRAVENOUS
  Administered 2011-11-25: 20 ug/min via INTRAVENOUS
  Filled 2011-11-25: qty 16

## 2011-11-25 MED ORDER — SODIUM BICARBONATE 8.4 % IV SOLN
INTRAVENOUS | Status: AC
  Administered 2011-11-25: 100 meq via INTRAVENOUS
  Filled 2011-11-25: qty 100

## 2011-11-25 MED ORDER — VASOPRESSIN 20 UNIT/ML IJ SOLN
0.0300 [IU]/min | INTRAVENOUS | Status: DC
  Administered 2011-11-25: 0.03 [IU]/min via INTRAVENOUS
  Filled 2011-11-25: qty 2.5

## 2011-11-25 MED ORDER — BIOTENE DRY MOUTH MT LIQD
15.0000 mL | Freq: Two times a day (BID) | OROMUCOSAL | Status: DC
  Administered 2011-11-25 – 2011-12-02 (×14): 15 mL via OROMUCOSAL

## 2011-11-25 MED ORDER — DEXTROSE-NACL 5-0.9 % IV SOLN
INTRAVENOUS | Status: DC
  Administered 2011-11-25: 1000 mL via INTRAVENOUS
  Administered 2011-11-26: 08:00:00 via INTRAVENOUS
  Administered 2011-11-27: 50 mL/h via INTRAVENOUS
  Administered 2011-11-28: 03:00:00 via INTRAVENOUS
  Administered 2011-11-28: 20 mL via INTRAVENOUS
  Administered 2011-11-29: 20 mL/h via INTRAVENOUS

## 2011-11-25 MED ORDER — LEVOTHYROXINE SODIUM 100 MCG IV SOLR
37.5000 ug | Freq: Every day | INTRAVENOUS | Status: DC
  Administered 2011-11-26 – 2011-11-28 (×3): 38 ug via INTRAVENOUS
  Filled 2011-11-25 (×3): qty 1.9

## 2011-11-25 NOTE — Progress Notes (Signed)
Name: Teresa Callahan MRN: [id number] DOB: 12-27-1917    LOS: 1  [place] [facility]/Critical [facility]  History of Present Illness:  This is a highly functional 53 YOWF, who is followed by Urology for chronic UTI. Reports last seen by Dr Sherron Monday on 2/26 for follow up re: urinary  Incontinence. At this time she was placed on nitrofurantoin for possible UTI. She reported spending the greater part of the days on 2/26 thru 3/2 in bed due to nausea, weakness and inability to take in fluids, as well as light headedness when she would try to stand. Per her family she rallied briefly on the am of 3/3, then that afternoon began to develop dry non-productive cough. This was associated with progressive Shortness of breath and temp of 100 the evening of 3/3. She Presented to the ER on 3/4 with resting dyspnea, O2 sats  leukocytosis, lactic acid of 6.5 and CXR with diffuse pulm infiltrates. She was placed on oxygen, given IVFs and empiric antibiotics. PCCM was asked to eval.    Lines / Drains:   Cultures: PCT 3/4: 3.28 Lactate 3/4: 6.5>>>4.1 (after fluid resuscitation in ER) Sputum 3/4>>> BC X2 3/4>>> Urine strep 3/4>>>neg Urine legionella 3/4>>>neg Influenza PCR 3/4>>> 3/5 PCT : 2.08  Antibiotics: levaquin 3/4 (Cover CAP and UTI)>>> Rocephin 3/4 (CAP)>>>   Tests / Events Cortisol 3/4: 24.9 ESR: 3/4: 65 EGDT 3/4>>>: Echo 3/5>>>  Subjective/interval Now off pressors, up in chair on 100% NRB but WOB seems acceptable.   Vital Signs: BP 101/43  Pulse 90  Temp(Src) 97.7 F (36.5 C) (Oral)  Resp 32  Ht 5\' 2"  (1.575 m)  Wt 57.1 kg (125 lb 14.1 oz)  BMI 23.02 kg/m2  SpO2 90%  CVP:  [1 mmHg-13 mmHg] 6 mmHg  I/O last 3 completed shifts: In: 4885.3 [I.V.:3725.3; IV Piggyback:1160] Out: 1000 [Urine:1000]  Physical Examination: General: 78 year old female, now on NRB, WOB a little better Neuro:  Awake and oriented w/out any focal deficits HEENT:  Neck veins flat, mm dry  Cardiovascular:  rrr Lungs:  Crackles posterior bilaterally  Abdomen:  Non-tender, + bowel sounds Musculoskeletal:  Intact.   Ventilator settings: Vent Mode:  [-]  FiO2 (%):  [50 %-100 %] 65 %  Labs and Imaging:    Lab 11/25/11 0400 11/24/11 1130  NA 130* 129*  K 3.4* 4.1  CL 97 90*  CO2 19 21  BUN 27* 39*  CREATININE 0.94 1.46*  GLUCOSE 168* 116*    Lab 11/25/11 0400 11/24/11 1130  HGB 10.9* 11.7*  HCT 31.4* 33.4*  WBC 32.3* 22.3*  PLT 446* 321    Lab 11/25/11 1215 11/25/11 0130 11/24/11 1130  CKTOTAL 37 51 30  CKMB 5.4* 7.9* 5.3*  TROPONINI 0.43* 0.58* 0.48*     Lab 11/25/11 0400 11/24/11 1221  PROBNP 10302.0* 8742.0*  PCXR: bilateral pulmonary infiltrates. Increased R> L airspace disease late afternoon 3/4  Assessment and Plan:  Circulatory shock (now resolved): Did meet septic shock criteria, but still not convinced that this is truly infectious in nature. Favor primarily hypovolemia as the etiology of her hypotension. Also think that MAP goals of 65 are likely above the pt's norm given her h/o typical SBP in 90s. Her end-organ function suggests that she has been adequately resuscitated.   Lab 11/25/11 1215 11/25/11 0400 11/24/11 1540 11/24/11 1130  PROCALCITON -- 2.08 -- 3.28  WBC -- 32.3* -- 22.3*  LATICACIDVEN 2.6* -- 4.1* 6.5*  Plan: - Will aim for systolic sbp >95 -  cont IVFs/ but aim for euvolemia/even fluid status -cont abx (see dashboard) -f/u echo  Acute respiratory failure in the setting bilateral infiltrates: Her CXR was worse, as was her work of breathing. Think that this is most likely a pneumonitis (either post-viral or nitrofurantoin induced) or ALI. Now probably complicated by iatrogenic volume overload. A little better 3/4 in afternoon.  Plan: -wean FIO2, sats > 92%/ cont NIPPV PRN -scheduled Nebs -f/u pan culture -empiric CAP coverage -steroid trial - consider lasix if hemodynamically stable overnight  Elevated Cardiac enzymes/demand ischemia: prelim  ECHO w/ good LV fxn.  Plan: -trend CEs -asa daily  Lactic acidosis (resolved): d/t shock. Had over 10% clearance in ER (excellent prognostic sign) Plan: -recheck, see above   Hyponatremia: this is most likely d/t hypovolemia  Lab 11/25/11 0400 11/24/11 1130  NA 130* 129*  plan: -Hydrate -f/u chemistry  Hypokalemia  Lab 11/25/11 0400 11/24/11 1130  K 3.4* 4.1  lan Replaced and recheck in am   Acute renal failure: resolved.  Recent Labs  Basename 11/25/11 0400 11/24/11 1130   CREATININE 0.94 1.46*  plan -hydrate -f/u chemistry  Chronic UTI:  Plan -pan culture -have asked Urology to see: specifically including any micro data they may have.   Hypothyroidism Lab Results  Component Value Date   TSH 2.513 11/24/2011  Plan -cont synthroid   Best practices / Disposition: -->ICU status under PCCM -->full code -->Heparin for DVT Px -->Protonix for GI Px -->diet -->family updated at bedside   BABCOCK,PETE 11/25/2011, 2:55 PM   Critical care time 30 minutes. Re-evaluation of treatment plan, titration of therapy and discussion with family.   Levy Pupa, MD, PhD 11/25/2011, 3:19 PM [place] [facility] [phone] or if no answer [phone]

## 2011-11-25 NOTE — Progress Notes (Signed)
eLink Physician-Brief Progress Note Patient Name: Teresa Callahan DOB: February 23, 1918 MRN: [id number]  Date of Service  11/25/2011   HPI/Events of Note  ABG shows metabolic acidosis on BiPAP with pH 7.25/36/100/15.  Tolerating BiPAP.  BP via aline improved at 116/43 (67)   eICU Interventions  Plan: 2 amps of bicarb IVP Continue on BiPAP Recheck ABG at 5am   Intervention Category Major Interventions: Acid-Base disturbance - evaluation and management  [name] 11/25/2011, 1:39 AM

## 2011-11-25 NOTE — Progress Notes (Signed)
  Echocardiogram 2D Echocardiogram has been performed.  Teresa Callahan [facility] 11/25/2011, 8:24 AM

## 2011-11-25 NOTE — Progress Notes (Signed)
eLink Physician-Brief Progress Note Patient Name: Teresa Callahan DOB: June 25, 1918 MRN: [id number]  Date of Service  11/25/2011   HPI/Events of Note  Continued hypotension in the setting of shock.  Transitioned to NE gtt from Neo now with BP of 93/34 (49).  On steroids.     eICU Interventions  Plan: Start vasopressin   Intervention Category Major Interventions: Sepsis - evaluation and management  Sanae Willetts 11/25/2011, 5:32 AM

## 2011-11-25 NOTE — Progress Notes (Signed)
elink MD said ok not to start dobutamine gtt with co-ox of 69. CVP goal reached and SBP improved.

## 2011-11-25 NOTE — Progress Notes (Signed)
CARE MANAGEMENT NOTE 11/25/2011  Patient:  NAVI, EWTON   Account Number:  [id number]  Date Initiated:  11/25/2011  Documentation initiated by:  Emsley Custer  Subjective/Objective Assessment:   pt with dyspena resp. distress requiing bipap and poss intubation for p02 of 81%     Action/Plan:   live alone at home   Anticipated DC Date:  11/28/2011   Anticipated DC Plan:  HOME/SELF CARE  In-house referral  NA      DC Planning Services  NA      [facility] Choice  NA   Choice offered to / List presented to:  NA   DME arranged  NA      DME agency  NA     HH arranged  NA      HH agency  NA   Status of service:  In process, will continue to follow Medicare Important Message given?  YES (If response is "NO", the following Medicare IM given date fields will be blank) Date Medicare IM given:  11/24/2011 Date Additional Medicare IM given:    Discharge Disposition:    Per UR Regulation:  Reviewed for med. necessity/level of care/duration of stay  Comments:  [id number]/Jazir Newey,RN,BSn,CCM

## 2011-11-25 NOTE — Procedures (Signed)
Arterial Catheter Insertion Procedure Note Teresa Callahan [id number] 1918/09/09           Procedure: Insertion of Arterial Catheter  Indications: Blood pressure monitoring  Procedure Details Consent: Risks of procedure as well as the alternatives and risks of each were explained to the (patient/caregiver).  Consent for procedure obtained. Time Out: Verified patient identification, verified procedure, site/side was marked, verified correct patient position, special equipment/implants available, medications/allergies/relevent history reviewed, required imaging and test results available.  Performed  Maximum sterile technique was used including antiseptics, cap, gloves, gown, hand hygiene, mask and sheet. Skin prep: Chlorhexidine; local anesthetic administered 20 gauge catheter was inserted into right radial artery using the Seldinger technique.  Evaluation Blood flow good; BP tracing good. Complications: No apparent complications.   Teresa Callahan 11/25/2011

## 2011-11-25 NOTE — Progress Notes (Signed)
Name: TECIA CINNAMON MRN: [id number] DOB: [birth year]    LOS: 1   Pulmonary/Critical Care  History of Present Illness:  This is a highly functional 78 YOWF, who is followed by Urology for chronic UTI. Reports last seen by Dr Sherron Monday on 2/26 for follow up re: urinary  Incontinence. At this time she was placed on nitrofurantoin for possible UTI. She reported spending the greater part of the days on 2/26 thru 3/2 in bed due to nausea, weakness and inability to take in fluids, as well as light headedness when she would try to stand. Per her family she rallied briefly on the am of 3/3, then that afternoon began to develop dry non-productive cough. This was associated with progressive Shortness of breath and temp of 100 the evening of 3/3. She Presented to the ER on 3/4 with resting dyspnea, O2 sats  leukocytosis, lactic acid of 6.5 and CXR with diffuse pulm infiltrates. She was placed on oxygen, given IVFs and empiric antibiotics. PCCM was asked to eval.    Lines / Drains:   Cultures: PCT 3/4: 3.28 Lactate 3/4: 6.5>>>4.1 (after fluid resuscitation in ER) Sputum 3/4>>> BC X2 3/4>>> Urine strep 3/4>>>neg Urine legionella 3/4>>> Influenza PCR 3/4>>> 3/5 PCT : 2.08  Antibiotics: levaquin 3/4 (Cover CAP and UTI)>>> Rocephin 3/4 (CAP)>>>   Tests / Events Cortisol 3/4: 24.9 ESR: 3/4: 65 EGDT 3/4>>>: Echo 3/5>>>  Subjective/interval  Placed on EGDT protocol last PM. Still pressor dependant.   Vital Signs: BP 138/47  Pulse 85  Temp(Src) 97.4 F (36.3 C) (Axillary)  Resp 28  Ht 5\' 2"  (1.575 m)  Wt 57.1 kg (125 lb 14.1 oz)  BMI 23.02 kg/m2  SpO2 81%  CVP:  [2 mmHg-13 mmHg] 2 mmHg . norepinephrine (LEVOPHED) Adult infusion 10 mcg/min (11/25/11 0714)  . vasopressin (PITRESSIN) infusion - *FOR SHOCK* 0.03 Units/min (11/25/11 0605)   I/O last 3 completed shifts: In: 4885.3 [I.V.:3725.3; IV Piggyback:1160] Out: 1000 [Urine:1000]  Physical Examination: General: 78 year old  female, now on NIPPV, has accessory muscle use w/ exertion Neuro:  Awake and oriented w/out any focal deficits HEENT:  Neck veins flat, mm dry  Cardiovascular: rrr Lungs:  Crackles posterior bilaterally  Abdomen:  Non-tender, + bowel sounds Musculoskeletal:  Intact.   Ventilator settings: Vent Mode:  [-]  FiO2 (%):  [80 %-100 %] 80 %  Labs and Imaging:    Lab 11/25/11 0400 11/24/11 1130  NA 130* 129*  K 3.4* 4.1  CL 97 90*  CO2 19 21  BUN 27* 39*  CREATININE 0.94 1.46*  GLUCOSE 168* 116*    Lab 11/25/11 0400 11/24/11 1130  HGB 10.9* 11.7*  HCT 31.4* 33.4*  WBC 32.3* 22.3*  PLT 446* 321    Lab 11/25/11 0130 11/24/11 1130  CKTOTAL 51 30  CKMB 7.9* 5.3*  TROPONINI 0.58* 0.48*     Lab 11/25/11 0400 11/24/11 1221  PROBNP 10302.0* 8742.0*  PCXR: bilateral pulmonary infiltrates. Increased R> L airspace disease late afternoon 3/4  Assessment and Plan:  Circulatory shock: Meets septic shock criteria, but still not convinced that this is truly infectious in nature. Favor primarily hypovolemia as the etiology of her hypotension. Also think that MAP goals of 65 are likely above the pt's norm given her h/o typical SBP in 90s. Her end-organ function suggests that she has been adequately resuscitated.   Lab 11/25/11 0400 11/24/11 1540 11/24/11 1130  PROCALCITON 2.08 -- 3.28  WBC 32.3* -- 22.3*  LATICACIDVEN -- 4.1* 6.5*  Plan: -change hemodynamic goals. Will aim for systolic sbp >95 -can stop cortisol/ her cortisol was nml -cont IVFs/ but aim for euvolemia/even fluid status -check Cortisol -cont abx (see dashboard) -f/u echo  Acute respiratory failure in the setting bilateral infiltrates: Her CXR is worse, as is her work of breathing. Think that this is most likely a pneumonitis (either post-viral or nitrofurantoin induced) or ALI. Now probably complicated by iatrogenic volume overload.  Plan: -wean FIO2, sats > 92%/ cont NIPPV PRN -scheduled Nebs -pan  culture -empiric CAP coverage -steroid trial - high risk for intubation  Elevated Cardiac enzymes/demand ischemia: prelim ECHO w/ good LV fxn.  Plan: -trend CEs -asa daily  Lactic acidosis: d/t shock. Had over 10% clearance in ER (excellent prognostic sign) Plan: -recheck, see above   Hyponatremia: this is most likely d/t hypovolemia  Lab 11/25/11 0400 11/24/11 1130  NA 130* 129*  plan: -Hydrate -f/u chemistry  Hypokalemia  Lab 11/25/11 0400 11/24/11 1130  K 3.4* 4.1  lan Replace and recheck  Acute renal failure: resolved.  Recent Labs  Basename 11/25/11 0400 11/24/11 1130   CREATININE 0.94 1.46*  plan -hydrate -f/u chemistry  Chronic UTI:  Plan -pan culture -have asked Urology to see: specifically including any micro data they may have.   Hypothyroidism Lab Results  Component Value Date   TSH 2.513 11/24/2011  Plan -cont synthroid   Best practices / Disposition: -->ICU status under PCCM -->full code -->Heparin for DVT Px -->Protonix for GI Px -->diet -->family updated at bedside   BABCOCK,PETE 11/25/2011, 8:16 AM  Reviewed above, examined pt, and agree with assessment/plan.  Main concern is infection vs inflammatory process causing pulmonary infiltrates.  She is still requiring increased FiO2 and not able to tolerate coming off BPAP.  Will continue current Abx and start solumedrol.  Wean off pressors as tolerated to keep SBP > 90.  Try to keep in even fluid balance.  Discussed with pt and family about possibility of short term intubation.  Explained this may be more comfortable, allow for enteral nutrition, and additional testing with CT chest.  Will decide about need for intubation depending on her progress.  Critical care time 50 minutes.  Coralyn Helling, MD 11/25/2011, 8:16 AM Pager:  [phone]

## 2011-11-25 NOTE — Progress Notes (Signed)
eLink Physician-Brief Progress Note Patient Name: Teresa Callahan DOB: 08/22/1918 MRN: [id number]  Date of Service  11/25/2011   HPI/Events of Note  Patient placed on BiPAP with sats of 99% and RR of 29.  Tolerating therapy.  Aline inserted shows BP 107/40 (62) on 5 mcg NE and 70 mcg of NEO.     eICU Interventions  Plan: Await ABG results Titate NEO to off if able and use NE for pressor support Insert foley catheter to monitor UOP   Intervention Category Major Interventions: Shock - evaluation and management Intermediate Interventions: Respiratory distress - evaluation and management  Anela Bensman 11/25/2011, 1:29 AM

## 2011-11-26 ENCOUNTER — Inpatient Hospital Stay (HOSPITAL_COMMUNITY): Payer: Medicare Other

## 2011-11-26 LAB — CBC
HCT: 26 % — ABNORMAL LOW (ref 36.0–46.0)
MCHC: 35.8 g/dL (ref 30.0–36.0)
Platelets: 369 10*3/uL (ref 150–400)
RDW: 15.5 % (ref 11.5–15.5)

## 2011-11-26 LAB — COMPREHENSIVE METABOLIC PANEL
AST: 42 U/L — ABNORMAL HIGH (ref 0–37)
Albumin: 2.1 g/dL — ABNORMAL LOW (ref 3.5–5.2)
Alkaline Phosphatase: 152 U/L — ABNORMAL HIGH (ref 39–117)
BUN: 17 mg/dL (ref 6–23)
Creatinine, Ser: 0.61 mg/dL (ref 0.50–1.10)
Potassium: 3.7 mEq/L (ref 3.5–5.1)
Total Protein: 5.6 g/dL — ABNORMAL LOW (ref 6.0–8.3)

## 2011-11-26 LAB — URINE CULTURE
Colony Count: NO GROWTH
Culture  Setup Time: 201303050902

## 2011-11-26 LAB — PROCALCITONIN: Procalcitonin: 1.4 ng/mL

## 2011-11-26 MED ORDER — FUROSEMIDE 10 MG/ML IJ SOLN
40.0000 mg | Freq: Once | INTRAMUSCULAR | Status: AC
  Administered 2011-11-26: 40 mg via INTRAVENOUS
  Filled 2011-11-26: qty 4

## 2011-11-26 MED ORDER — DEXTROSE 5 % IV SOLN
500.0000 mg | INTRAVENOUS | Status: DC
  Administered 2011-11-26 – 2011-11-29 (×4): 500 mg via INTRAVENOUS
  Filled 2011-11-26 (×5): qty 500

## 2011-11-26 MED ORDER — POTASSIUM CHLORIDE 20 MEQ/15ML (10%) PO LIQD
40.0000 meq | Freq: Once | ORAL | Status: AC
  Administered 2011-11-26: 40 meq via ORAL
  Filled 2011-11-26: qty 30

## 2011-11-26 NOTE — Progress Notes (Signed)
Name: Teresa Callahan MRN: [id number] DOB: 29-Mar-1918    LOS: 2  Teresa Callahan/Critical Care  History of Present Illness:  This is a highly functional 43 YOWF, who is followed by Urology for chronic UTI. Reports last seen by Dr Sherron Monday on 2/26 for follow up re: urinary  Incontinence. At this time she was placed on nitrofurantoin for possible UTI. She reported spending the greater part of the days on 2/26 thru 3/2 in bed due to nausea, weakness and inability to take in fluids, as well as light headedness when she would try to stand. Per her family she rallied briefly on the am of 3/3, then that afternoon began to develop dry non-productive cough. This was associated with progressive Shortness of breath and temp of 100 the evening of 3/3. She Presented to the ER on 3/4 with resting dyspnea, O2 sats  leukocytosis, lactic acid of 6.5 and CXR with diffuse pulm infiltrates. She was placed on oxygen, given IVFs and empiric antibiotics. PCCM was asked to eval.    Lines / Drains: Right IJ CVL 3/4>>>  Cultures: PCT 3/4: 3.28 Lactate 3/4: 6.5>>>4.1 (after fluid resuscitation in ER) Sputum 3/4>>> BC X2 3/4>>> Urine strep 3/4>>>neg Urine legionella 3/4>>>neg Influenza PCR 3/4>>>neg 3/5 PCT : 2.08  Antibiotics: levaquin 3/4 (Cover CAP and UTI)>>> Rocephin 3/4 (CAP)>>>   Tests / Events Cortisol 3/4: 24.9 ESR: 3/4: 65 EGDT 3/4>>>:3/5 Echo 3/5>>>Systolic function was normal. The estimated ejection fraction was in the range of 55% to 60%. Although no diagnostic regional wall motion abnormality was identified, this possibility cannot be completely excluded on the basis of this study. Features are consistent with a pseudonormal left ventricular filling pattern, with concomitant abnormal relaxation and increased filling pressure (grade 2 diastolic dysfunction). Doppler parameters are consistent with elevated mean left atrial filling pressure.    Subjective/interval  No distress. Still on  BIPAP but looking better.   Vital Signs: BP 109/48  Pulse 80  Temp(Src) 97.9 F (36.6 C) (Oral)  Resp 30  Ht 5\' 2"  (1.575 m)  Wt 58 kg (127 lb 13.9 oz)  BMI 23.39 kg/m2  SpO2 97% 100%  CVP:  [1 mmHg-12 mmHg] 8 mmHg  I/O last 3 completed shifts: In: 4381 [P.O.:240; I.V.:3931; IV Piggyback:210] Out: 3000 [Urine:3000]  Physical Examination: General: 78 year old female, now on NIPPV, WOB a little better Neuro:  Awake and oriented w/out any focal deficits HEENT:  Neck veins flat, mm dry  Cardiovascular: rrr Lungs:  Crackles posterior bilaterally  Abdomen:  Non-tender, + bowel sounds Musculoskeletal:  Intact.   Ventilator settings: Vent Mode:  [-]  FiO2 (%):  [60 %-100 %] 100 %  Labs and Imaging:    Lab 11/26/11 0430 11/25/11 0400 11/24/11 1130  NA 136 130* 129*  K 3.7 3.4* 4.1  CL 104 97 90*  CO2 25 19 21   BUN 17 27* 39*  CREATININE 0.61 0.94 1.46*  GLUCOSE 139* 168* 116*    Lab 11/26/11 0430 11/25/11 0400 11/24/11 1130  HGB 9.3* 10.9* 11.7*  HCT 26.0* 31.4* 33.4*  WBC 13.8* 32.3* 22.3*  PLT 369 446* 321    Lab 11/25/11 1215 11/25/11 0130 11/24/11 1130  CKTOTAL 37 51 30  CKMB 5.4* 7.9* 5.3*  TROPONINI 0.43* 0.58* 0.48*     Lab 11/25/11 0400 11/24/11 1221  PROBNP 10302.0* 8742.0*  PCXR: bilateral Callahan infiltrates. Increased R> L airspace disease, can't exclude right effusion.   Assessment and Plan:  Circulatory shock (now resolved): Did meet septic shock  criteria, but still not convinced that this is truly infectious in nature. Favor primarily hypovolemia as the etiology of her hypotension. Also think that MAP goals of 65 are likely above the pt's norm given her h/o typical SBP in 90s. Her end-organ function suggests that she has been adequately resuscitated.   Lab 11/26/11 0430 11/25/11 1215 11/25/11 0400 11/24/11 1540 11/24/11 1130  PROCALCITON 1.40 -- 2.08 -- 3.28  WBC 13.8* -- 32.3* -- 22.3*  LATICACIDVEN -- 2.6* -- 4.1* 6.5*  Plan: - Will  aim for systolic sbp >90 -cont IVFs/ but aim for euvolemia/even fluid status -cont abx (see dashboard)   Acute respiratory failure in the setting bilateral infiltrates: Her CXR was worse, as was her work of breathing. Think that this is most likely a pneumonitis (either post-viral or nitrofurantoin induced) or ALI. Now probably complicated by iatrogenic volume overload. No sig improvement in Oxygen requirements.  Plan: -wean FIO2, sats > 90% cont NIPPV PRN -scheduled Nebs -f/u pan culture -empiric CAP coverage -steroid trial -diurese today  Elevated Cardiac enzymes/demand ischemia: prelim ECHO w/ good LV fxn, and gd 2 diastolic dysfunction Plan: -trend CEs -asa daily  Lactic acidosis (resolved): d/t shock. Had over 10% clearance in ER (excellent prognostic sign) Plan: -recheck, see above   Hyponatremia: this is most likely d/t hypovolemia  Lab 11/26/11 0430 11/25/11 0400 11/24/11 1130  NA 136 130* 129*  plan: -f/u chemistry  Hypokalemia (resolved)  Lab 11/26/11 0430 11/25/11 0400 11/24/11 1130  K 3.7 3.4* 4.1     Acute renal failure: resolved.  Recent Labs  Basename 11/26/11 0430 11/25/11 0400 11/24/11 1130   CREATININE 0.61 0.94 1.46*  plan -f/u chemistry  Chronic UTI (cultures negative)  Hypothyroidism Lab Results  Component Value Date   TSH 2.513 11/24/2011  Plan -cont synthroid   Best practices / Disposition: -->ICU status under PCCM -->full code -->Heparin for DVT Px -->Protonix for GI Px -->diet -->family updated at bedside   BABCOCK,PETE 11/26/2011, 8:13 AM   Reviewed above, examined pt and agree with assessment/plan.  She has improvement in hemodynamics, and may have slight volume overload>>will give dose of lasix today.  Main concern is that she is stuck on BPAP, and not sure her oxygen requirements would improve any time soon.  Also concerned about inability to provide nutrition while on BPAP.  Will need to address this over the next day or  two.  Critical care time 35 minutes.  Coralyn Helling, MD 11/26/2011, 9:32 AM Pager:  [phone]

## 2011-11-26 NOTE — Significant Event (Signed)
Goals of Care  Spoke at length per the Patient and her daughter's request [facility]- PO[facility]) re: limitation of care. Specifically Ms Teresa Callahan does not want prolonged care. She would not want to be supported on mechanical ventilation for an excessive amount of time.  Specifically her wishes are as follows: 1) NO CPR, NO defibrillation, NO pacemakers 2) would except short term mechanical ventilation and vasoactive gtt support if the condition was felt to be reversible.  She would ask that at 72 hour mark if her condition had not improved, or she had worsened she would then desire to transition to comfort care and be allowed to die a natural death.   Based on this we will label her a limited code Daughter and patient are in full agreement with this.   Anders Simmonds ACNP-BC [facility] Pulmonary/Critical Care Pager # [phone] OR # [phone] if no answer

## 2011-11-26 NOTE — Significant Event (Signed)
Spoke with Dr Laverle Patter who called from [facility]. He had pulled Teresa Callahan Chart. Her Last Urine Culture obtained as of 2/20 showed E-coli. This was Resistant to quinolones, however was sensitive to essentially all cephalosporins.  Plan: -stop levaquin, change to azithromycin (to keep CAP coverage) -continue rocephin for CAP and UTI coverage.   Teresa Callahan ACNP-BC [facility] Pulmonary/Critical Care Pager # [phone] OR # [phone] if no answer

## 2011-11-27 ENCOUNTER — Inpatient Hospital Stay (HOSPITAL_COMMUNITY): Payer: Medicare Other

## 2011-11-27 DIAGNOSIS — J9600 Acute respiratory failure, unspecified whether with hypoxia or hypercapnia: Secondary | ICD-10-CM

## 2011-11-27 LAB — BASIC METABOLIC PANEL
Calcium: 9.2 mg/dL (ref 8.4–10.5)
Creatinine, Ser: 0.59 mg/dL (ref 0.50–1.10)
GFR calc Af Amer: >90 mL/min (ref >90)

## 2011-11-27 MED ORDER — DOCUSATE SODIUM 100 MG PO CAPS
100.0000 mg | ORAL_CAPSULE | Freq: Two times a day (BID) | ORAL | Status: DC
  Administered 2011-11-27 – 2011-12-03 (×13): 100 mg via ORAL
  Filled 2011-11-27 (×15): qty 1

## 2011-11-27 MED ORDER — POTASSIUM CHLORIDE 20 MEQ/15ML (10%) PO LIQD
20.0000 meq | Freq: Two times a day (BID) | ORAL | Status: DC
  Administered 2011-11-27 – 2011-12-01 (×10): 20 meq
  Filled 2011-11-27 (×13): qty 15

## 2011-11-27 MED ORDER — MAGNESIUM HYDROXIDE 400 MG/5ML PO SUSP: 30.0000 mL | Freq: Every day | ORAL | Status: DC | PRN

## 2011-11-27 MED ORDER — FUROSEMIDE 10 MG/ML IJ SOLN
40.0000 mg | Freq: Two times a day (BID) | INTRAMUSCULAR | Status: DC
  Administered 2011-11-27 – 2011-12-02 (×11): 40 mg via INTRAVENOUS
  Filled 2011-11-27 (×12): qty 4

## 2011-11-27 NOTE — Progress Notes (Signed)
ANTIBIOTIC CONSULT NOTE - FOLLOW UP  Pharmacy Consult for Ceftriaxone, adjust abx for renal function Indication: UTI vs CAP   Allergies  Allergen Reactions  . Horse-Derived Products Other (See Comments)    Loss of appetite  . Sulfa Antibiotics Other (See Comments)    Loss of appetite     Patient Measurements: Height: 5\' 2"  (157.5 cm) Weight: 127 lb 3.3 oz (57.7 kg) IBW/kg (Calculated) : 50.1   Vital Signs: Temp: 97.5 F (36.4 C) (03/07 0800) Temp src: Oral (03/07 0800) BP: 109/48 mmHg (03/07 0813) Pulse Rate: 83  (03/07 0813) Intake/Output from previous day: 03/06 0701 - 03/07 0700 In: 1454 [I.V.:1150; IV Piggyback:304] Out: 2190 [Urine:2190] Intake/Output from this shift:    Labs:  Basename 11/26/11 0430 11/25/11 0400 11/24/11 1130  WBC 13.8* 32.3* 22.3*  HGB 9.3* 10.9* 11.7*  PLT 369 446* 321  LABCREA -- -- --  CREATININE 0.61 0.94 1.46*   Estimated Creatinine Clearance: 36.2 ml/min (by C-G formula based on Cr of 0.61). No results found for this basename: VANCOTROUGH:2,VANCOPEAK:2,VANCORANDOM:2,GENTTROUGH:2,GENTPEAK:2,GENTRANDOM:2,TOBRATROUGH:2,TOBRAPEAK:2,TOBRARND:2,AMIKACINPEAK:2,AMIKACINTROU:2,AMIKACIN:2, in the last 72 hours   Assessment:  55 YOF on h/o of chronic UTI on Macrobid PTA.  Now on D4 of Rocephin and D2 azithro for r/o PNA and UTI.  Levofloxacin was discontinued 3/6 after outpatient MD reported previous quinolones resistant E.coli UTI.   Culture neg thus far. PCT and WBC decreasing.   Current antibiotics do not need renal adjustment   Plan:   No change in antibiotics  Pharmacy will f/u peripherally  Geoffry Paradise Thi 11/27/2011,10:03 AM

## 2011-11-27 NOTE — Significant Event (Signed)
I spoke with pt's daughter-in-law, Courtnie Brenes (cell # [phone]).  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    I explained that the presumptive dx at this time is either community acquired pneumonia vs acute pneumonitis related to nitrofurantoin use.  Explained that we are treating with antibiotics and systemic steroids.  Explained that her antibiotic regimen should also be appropriate coverage for her chronic UTI's based on most recent outpt culture results.  Also explained that we are now trying diuresis since her blood pressure and renal function have improved.  Advised that it is too early to determine what type of disposition she may need RE home heatlh, SNF, etc.  Also explained that she may need long-term supplemental oxygen use, but it is too early to determine this.  Coralyn Helling, MD 11/27/2011, 11:00 AM Pager:  [phone]

## 2011-11-27 NOTE — Progress Notes (Addendum)
Found patient in room unresponsive, gray skin color, eyes rolled in back of head and non-rebreather hanging around neck (off mouth) in bedside chair. 02 saturation 24% on monitor (alarms sounding). Kyla Balzarine, NT, Trilby Leaver, RN, A. Morrie Sheldon, RN and E. Dahlia Client, RN in room and NRB mask reapplied. Moved patient back to bed. HOB 45 degrees. Oxygen saturation improving and LOC increasing. Patient A&OX4 at this time. No S/S of injury. Education done with patient on importance of keeping mask on face properly. Charge Nurse aware.   RN never notified of decreased O2 saturations.

## 2011-11-27 NOTE — Progress Notes (Signed)
Name: Teresa Callahan MRN: [id number] DOB: [birth year]    LOS: 3  [facility]  History of Present Illness:  This is a highly functional 50 YOWF, who is followed by Urology for chronic UTI. Reports last seen by Dr Sherron Monday on 2/26 for follow up re: urinary  Incontinence. At this time she was placed on nitrofurantoin for possible UTI. She reported spending the greater part of the days on 2/26 thru 3/2 in bed due to nausea, weakness and inability to take in fluids, as well as light headedness when she would try to stand. Per her family she rallied briefly on the am of 3/3, then that afternoon began to develop dry non-productive cough. This was associated with progressive Shortness of breath and temp of 100 the evening of 3/3. She Presented to the ER on 3/4 with resting dyspnea, O2 sats  leukocytosis, lactic acid of 6.5 and CXR with diffuse pulm infiltrates. She was placed on oxygen, given IVFs and empiric antibiotics. PCCM was asked to eval.    Lines / Drains: Right IJ CVL 3/4>>>  Cultures: PCT 3/4: 3.28 Lactate 3/4: 6.5>>>4.1 (after fluid resuscitation in ER) Sputum 3/4>>> BC X2 3/4>>> Urine strep 3/4>>>neg Urine legionella 3/4>>>neg Influenza PCR 3/4>>>neg 3/5 PCT : 2.08  Antibiotics: levaquin 3/4 (Cover CAP and UTI)>>> Rocephin 3/4 (CAP)>>>   Tests / Events Cortisol 3/4: 24.9 ESR: 3/4: 65 EGDT 3/4>>>:3/5 Echo 3/5>>>normal LV systolic fx, EF 55 to 60%, grade 2 diastolic dysfx  Subjective/interval Still on high FIO2, did require NIPPV last night.   Vital Signs: BP 109/48  Pulse 83  Temp(Src) 97.5 F (36.4 C) (Oral)  Resp 21  Ht 5\' 2"  (1.575 m)  Wt 127 lb 3.3 oz (57.7 kg)  BMI 23.27 kg/m2  SpO2 92% 100%     I/O last 3 completed shifts: In: 2054 [I.V.:1750; IV Piggyback:304] Out: 3615 [Urine:3615]  Physical Examination: General: 78 year old female, still requiring high FIO2.  Neuro:  Awake and oriented w/out any focal deficits HEENT:  Neck veins  flat, mm dry  Cardiovascular: rrr Lungs:  Crackles posterior bilaterally >> decreased compared to 3/6 Abdomen:  Non-tender, + bowel sounds Musculoskeletal:  Intact.   Ventilator settings: Vent Mode:  [-]  FiO2 (%):  [80 %-100 %] 100 %  Labs and Imaging:    Lab 11/26/11 0430 11/25/11 0400 11/24/11 1130  NA 136 130* 129*  K 3.7 3.4* 4.1  CL 104 97 90*  CO2 25 19 21   BUN 17 27* 39*  CREATININE 0.61 0.94 1.46*  GLUCOSE 139* 168* 116*    Lab 11/26/11 0430 11/25/11 0400 11/24/11 1130  HGB 9.3* 10.9* 11.7*  HCT 26.0* 31.4* 33.4*  WBC 13.8* 32.3* 22.3*  PLT 369 446* 321    Lab 11/25/11 1215 11/25/11 0130 11/24/11 1130  CKTOTAL 37 51 30  CKMB 5.4* 7.9* 5.3*  TROPONINI 0.43* 0.58* 0.48*     Lab 11/25/11 0400 11/24/11 1221  PROBNP 10302.0* 8742.0*   Dg Chest Port 1 View  11/26/2011  *RADIOLOGY REPORT*  Clinical Data:  Follow up airspace disease  PORTABLE CHEST - 1 VIEW  Comparison: Portable exam 0420 hours compared to 11/24/2011  Findings: Right jugular central venous catheter, tip projecting over SVC. Borderline enlargement of cardiac silhouette. Right hilar calcified adenopathy. Severe diffuse bilateral airspace infiltrates, increased on the left. More focal consolidation versus atelectasis in left lower lobe suspected. Probable layered right pleural effusion.  IMPRESSION: Diffuse bilateral airspace infiltrates, increased. Probable left pleural effusion and more  focal atelectasis or consolidation of left lower lobe.  Original Report Authenticated By: Lollie Marrow, M.D.     Assessment and Plan:  Acute respiratory failure in the setting bilateral infiltrates: working dx is pneumonitis (either post-viral or nitrofurantoin induced) or ALI. Now probably complicated by iatrogenic volume overload. No sig improvement in Oxygen requirements.  Plan: -wean FIO2, sats > 90% cont NIPPV PRN -scheduled Nebs -f/u pan culture -empiric CAP coverage -cont steroid trial -diurese again  today  Elevated Cardiac enzymes/demand ischemia: prelim ECHO w/ good LV fxn, and gd 2 diastolic dysfunction Plan: -asa daily   Acute renal failure: resolved.  Recent Labs  Basename 11/26/11 0430 11/25/11 0400 11/24/11 1130   CREATININE 0.61 0.94 1.46*    Hypothyroidism Lab Results  Component Value Date   TSH 2.513 11/24/2011  Plan -cont synthroid   Best practices / Disposition: -->ICU status under PCCM -->Limited code. Short term intubation, pressors no CPR, defib or pacemaker.  -->Heparin for DVT Px -->Protonix for GI Px -->diet: clears -->family updated at bedside   Palmira Stickle 11/27/2011, 9:11 AM  Reviewed above, examined pt, and agree with assessment/plan.  She has small improvement compared to 3/6.  She is now able to tolerate being off BPAP during the day (but still needs NRB and [place]).  Will likely still need intermittent use of BPAP, especially at night.  Will continue Abx, solumderol, and diuresis.  Pt would be okay for short term intubation (looking less likely that she will need this), but is otherwise DNR.   Coralyn Helling, MD 11/27/2011, 9:11 AM Pager:  [phone]

## 2011-11-28 ENCOUNTER — Inpatient Hospital Stay (HOSPITAL_COMMUNITY): Payer: Medicare Other

## 2011-11-28 LAB — CBC
MCHC: 34.1 g/dL (ref 30.0–36.0)
Platelets: 409 10*3/uL — ABNORMAL HIGH (ref 150–400)
RDW: 16.1 % — ABNORMAL HIGH (ref 11.5–15.5)
WBC: 10.2 10*3/uL (ref 4.0–10.5)

## 2011-11-28 LAB — BASIC METABOLIC PANEL
Chloride: 103 mEq/L (ref 96–112)
Creatinine, Ser: 0.6 mg/dL (ref 0.50–1.10)
GFR calc Af Amer: 90 mL/min — ABNORMAL LOW (ref >90)
GFR calc non Af Amer: 77 mL/min — ABNORMAL LOW (ref >90)
Potassium: 3.9 mEq/L (ref 3.5–5.1)

## 2011-11-28 MED ORDER — LEVOTHYROXINE SODIUM 88 MCG PO TABS: 70.0000 ug | ORAL_TABLET | Freq: Every day | ORAL | Status: DC

## 2011-11-28 MED ORDER — FUROSEMIDE 10 MG/ML IJ SOLN: 40.0000 mg | Freq: Two times a day (BID) | INTRAMUSCULAR | Status: DC

## 2011-11-28 MED ORDER — PANTOPRAZOLE SODIUM 40 MG PO TBEC
40.0000 mg | DELAYED_RELEASE_TABLET | Freq: Every day | ORAL | Status: DC
  Administered 2011-11-28 – 2011-12-01 (×4): 40 mg via ORAL
  Filled 2011-11-28 (×4): qty 1

## 2011-11-28 MED ORDER — LEVOTHYROXINE SODIUM 75 MCG PO TABS
75.0000 ug | ORAL_TABLET | Freq: Every day | ORAL | Status: DC
  Administered 2011-11-29 – 2011-12-04 (×6): 75 ug via ORAL
  Filled 2011-11-28 (×6): qty 1

## 2011-11-28 NOTE — Progress Notes (Signed)
Received case from Anders Simmonds NP - patient needs high flow oximizer for home 02. Norberta Keens RN with [facility] called for arrangements- oximizer to be delivered to the patient's room today. Abelino Derrick RN, BSN, [employer]

## 2011-11-28 NOTE — Progress Notes (Signed)
Name: Teresa Callahan MRN: [id number] DOB: Dec 07, 1917    LOS: 4  [facility]  History of Present Illness:  This is a highly functional 11 YOWF, who is followed by Urology for chronic UTI. Reports last seen by Dr Sherron Monday on 2/26 for follow up re: urinary  Incontinence. At this time she was placed on nitrofurantoin for possible UTI. She reported spending the greater part of the days on 2/26 thru 3/2 in bed due to nausea, weakness and inability to take in fluids, as well as light headedness when she would try to stand. Per her family she rallied briefly on the am of 3/3, then that afternoon began to develop dry non-productive cough. This was associated with progressive Shortness of breath and temp of 100 the evening of 3/3. She Presented to the ER on 3/4 with resting dyspnea, O2 sats  leukocytosis, lactic acid of 6.5 and CXR with diffuse pulm infiltrates. She was placed on oxygen, given IVFs and empiric antibiotics. PCCM was asked to eval.    Lines / Drains: Right IJ CVL 3/4>>>  Cultures: PCT 3/4: 3.28 Lactate 3/4: 6.5>>>4.1 (after fluid resuscitation in ER) BC X2 3/4>>> Urine strep 3/4>>>neg Urine legionella 3/4>>>neg Influenza PCR 3/4>>>neg 3/5 PCT : 2.08  Antibiotics: levaquin 3/4 (Cover CAP and UTI)>>>3/6 Rocephin 3/4 (CAP)>>>  azithro 3/6>>>  Tests / Events Cortisol 3/4: 24.9 ESR: 3/4: 65 EGDT 3/4>>>:3/5 Echo 3/5>>>normal LV systolic fx, EF 55 to 60%, grade 2 diastolic dysfx  Subjective/interval Now seems to be tolerating lower FIO2.    Vital Signs: BP 112/45  Pulse 79  Temp(Src) 97.3 F (36.3 C) (Oral)  Resp 18  Ht 5\' 2"  (1.575 m)  Wt 58.2 kg (128 lb 4.9 oz)  BMI 23.47 kg/m2  SpO2 98% 50% Venturi= 90%   I/O last 3 completed shifts: In: 2748 [P.O.:680; I.V.:1750; IV Piggyback:318] Out: 4170 [Urine:4170]  Physical Examination: General: 78 year old female, no distress.  Neuro:  Awake and oriented w/out any focal deficits HEENT:  Neck veins  flat, mm dry  Cardiovascular: rrr Lungs:  Crackles posterior bilaterally Abdomen:  Non-tender, + bowel sounds Musculoskeletal:  Intact.   Ventilator settings: Vent Mode:  [-]  FiO2 (%):  [100 %] 100 %  Labs and Imaging:    Lab 11/28/11 0540 11/27/11 0958 11/26/11 0430  NA 137 136 136  K 3.9 4.0 3.7  CL 103 103 104  CO2 29 24 25   BUN 18 17 17   CREATININE 0.60 0.59 0.61  GLUCOSE 122* 142* 139*    Lab 11/28/11 0540 11/26/11 0430 11/25/11 0400  HGB 9.4* 9.3* 10.9*  HCT 27.6* 26.0* 31.4*  WBC 10.2 13.8* 32.3*  PLT 409* 369 446*    Lab 11/25/11 1215 11/25/11 0130 11/24/11 1130  CKTOTAL 37 51 30  CKMB 5.4* 7.9* 5.3*  TROPONINI 0.43* 0.58* 0.48*     Lab 11/25/11 0400 11/24/11 1221  PROBNP 10302.0* 8742.0*   PCXR: no sig change in bilateral R>L airspace disease.    Assessment and Plan:  Acute respiratory failure in the setting bilateral infiltrates: working dx is pneumonitis (either post-viral or nitrofurantoin induced) or ALI. Now probably complicated by iatrogenic volume overload. No sig improvement in Oxygen requirements.  Plan: -wean FIO2, sats > 90% cont NIPPV PRN -scheduled Nebs -empiric CAP coverage -continue steroid trial  Elevated Cardiac enzymes/demand ischemia: prelim ECHO w/ good LV fxn, and gd 2 diastolic dysfunction Plan: -asa daily  Hypothyroidism Lab Results  Component Value Date   TSH 2.513 11/24/2011  Plan -continue synthroid  Protein-calorie Malnutrition Plan -advance diet as tolerated>>change to full liquid 3/8  Best practices / Disposition: -->ICU status under PCCM -->Limited code. Short term intubation, pressors no CPR, defib or pacemaker.  -->Heparin for DVT Px -->Protonix for GI Px -->diet: clears -->family updated at bedside   BABCOCK,PETE 11/28/2011, 9:20 AM   Reviewed above, examined pt and agree with assessment/plan.  Marginal improvement with diuresis.  Still needing BPAP at night, but able to tolerated time off BPAP  during day.  Advance diet as tolerated.  Continue Abx and solumedrol.  Will f/u CXR, BNP, and ESR.  Coralyn Helling, MD 11/28/2011, 10:15 AM Pager:  [phone]

## 2011-11-28 NOTE — Progress Notes (Signed)
CSW reviewed chart and attempted to reach out to family. None present, on way to the hospital. CSW will meet with family to assess needs when they are here at hospital. CSW will follow for needs as appropriate.  Mitzi Hansen [place], [place] 12:27 PM 11/28/2011 [phone]

## 2011-11-29 ENCOUNTER — Inpatient Hospital Stay (HOSPITAL_COMMUNITY): Payer: Medicare Other

## 2011-11-29 DIAGNOSIS — J8410 Pulmonary fibrosis, unspecified: Secondary | ICD-10-CM

## 2011-11-29 DIAGNOSIS — J9600 Acute respiratory failure, unspecified whether with hypoxia or hypercapnia: Secondary | ICD-10-CM

## 2011-11-29 LAB — SEDIMENTATION RATE: Sed Rate: 35 mm/hr — ABNORMAL HIGH (ref 0–22)

## 2011-11-29 LAB — BASIC METABOLIC PANEL
BUN: 18 mg/dL (ref 6–23)
Chloride: 100 mEq/L (ref 96–112)
GFR calc Af Amer: >90 mL/min (ref >90)
GFR calc non Af Amer: 80 mL/min — ABNORMAL LOW (ref >90)
Potassium: 4.1 mEq/L (ref 3.5–5.1)
Sodium: 138 mEq/L (ref 135–145)

## 2011-11-29 LAB — CBC
HCT: 29.6 % — ABNORMAL LOW (ref 36.0–46.0)
RDW: 15.9 % — ABNORMAL HIGH (ref 11.5–15.5)
WBC: 11.4 10*3/uL — ABNORMAL HIGH (ref 4.0–10.5)

## 2011-11-29 LAB — POTASSIUM: Potassium: 4.2 mEq/L (ref 3.5–5.1)

## 2011-11-29 LAB — PRO B NATRIURETIC PEPTIDE: Pro B Natriuretic peptide (BNP): 5864 pg/mL — ABNORMAL HIGH (ref 0–450)

## 2011-11-29 MED ORDER — VITAMINS A & D EX OINT
TOPICAL_OINTMENT | CUTANEOUS | Status: AC
  Administered 2011-11-29: 10:00:00
  Filled 2011-11-29: qty 5

## 2011-11-29 NOTE — Progress Notes (Signed)
Name: Teresa Callahan MRN: [id number] DOB: [birth year]    LOS: 5  [facility]  History of Present Illness:  This is a highly functional 78 YOWF, who is followed by Urology for chronic UTI. Reports last seen by Dr Sherron Monday on 2/26 for follow up re: urinary  Incontinence. At this time she was placed on nitrofurantoin for possible UTI. She reported spending the greater part of the days on 2/26 thru 3/2 in bed due to nausea, weakness and inability to take in fluids, as well as light headedness when she would try to stand. Per her family she rallied briefly on the am of 3/3, then that afternoon began to develop dry non-productive cough. This was associated with progressive Shortness of breath and temp of 100 the evening of 3/3. She Presented to the ER on 3/4 with resting dyspnea, O2 sats  leukocytosis, lactic acid of 6.5 and CXR with diffuse pulm infiltrates. She was placed on oxygen, given IVFs and empiric antibiotics. PCCM was asked to eval.    Lines / Drains: Right IJ CVL 3/4>>>  Cultures: PCT 3/4: 3.28 Lactate 3/4: 6.5>>>4.1 (after fluid resuscitation in ER) BC X2 3/4>>> Urine strep 3/4>>>neg Urine legionella 3/4>>>neg Influenza PCR 3/4>>>neg 3/5 PCT : 2.08  Antibiotics: levaquin 3/4 (Cover CAP and UTI)>>>3/6 Rocephin 3/4 (CAP)>>>  azithro 3/6>>>  Tests / Events Cortisol 3/4: 24.9 ESR: 3/4: 65 EGDT 3/4>>>:3/5 Echo 3/5>>>normal LV systolic fx, EF 55 to 60%, grade 2 diastolic dysfx  Subjective/interval Negative > 3L last 24h on lasix 40mg  iv bid Did not use BiPAP last night   Vital Signs: BP 124/52  Pulse 76  Temp(Src) 97.6 F (36.4 C) (Oral)  Resp 18  Ht 5\' 2"  (1.575 m)  Wt 59.7 kg (131 lb 9.8 oz)  BMI 24.07 kg/m2  SpO2 97% 50% Venturi= 90%   I/O last 3 completed shifts: In: 1880.5 [P.O.:480; I.V.:1082.5; IV Piggyback:318] Out: 5320 [Urine:5320]  Physical Examination: General: 78 year old female, no distress.  Neuro:  Awake and oriented w/out any  focal deficits HEENT:  Neck veins flat, mm dry  Cardiovascular: rrr Lungs:  Crackles posterior bilaterally Abdomen:  Non-tender, + bowel sounds Musculoskeletal:  Intact.   Ventilator settings: Vent Mode:  [-]  FiO2 (%):  [100 %] 100 %  Labs and Imaging:    Lab 11/29/11 0430 11/28/11 0540 11/27/11 0958  NA 138 137 136  K 4.1 3.9 4.0  CL 100 103 103  CO2 32 29 24  BUN 18 18 17   CREATININE 0.55 0.60 0.59  GLUCOSE 112* 122* 142*    Lab 11/29/11 0430 11/28/11 0540 11/26/11 0430  HGB 10.0* 9.4* 9.3*  HCT 29.6* 27.6* 26.0*  WBC 11.4* 10.2 13.8*  PLT 478* 409* 369    Lab 11/25/11 1215 11/25/11 0130 11/24/11 1130  CKTOTAL 37 51 30  CKMB 5.4* 7.9* 5.3*  TROPONINI 0.43* 0.58* 0.48*     Lab 11/29/11 0430 11/25/11 0400 11/24/11 1221  PROBNP 5864.0* 10302.0* 8742.0*   PCXR 3/9: No significant change in B infiltrates over last 72 hours   Assessment and Plan:  Acute respiratory failure in the setting bilateral infiltrates: working dx is pneumonitis (either post-viral or nitrofurantoin induced) or ALI. Now probably complicated by iatrogenic volume overload. No sig improvement in Oxygen requirements.  Plan: -wean FIO2, sats > 90% cont NIPPV PRN -scheduled Nebs -empiric CAP coverage (day 6 abx 3/9) -continue steroid trial, currently 80mg  solumedrol q12h  Elevated Cardiac enzymes/demand ischemia: prelim ECHO w/ good LV fxn,  and gd 2 diastolic dysfunction Plan: -asa daily  Hypothyroidism Lab Results  Component Value Date   TSH 2.513 11/24/2011  Plan -continue synthroid  Protein-calorie Malnutrition Plan -advance diet as tolerated>>change to full liquid 3/8  Best practices / Disposition: -->ICU status under PCCM -->Limited code. Short term intubation, pressors no CPR, defib or pacemaker.  -->Heparin for DVT Px -->Protonix for GI Px -->diet: clears -->family updated at bedside   Levy Pupa, MD, PhD 11/29/2011, 10:49 AM [facility] [phone] or if no answer [phone]

## 2011-11-30 ENCOUNTER — Inpatient Hospital Stay (HOSPITAL_COMMUNITY): Payer: Medicare Other

## 2011-11-30 LAB — CBC
MCHC: 34.3 g/dL (ref 30.0–36.0)
Platelets: 522 10*3/uL — ABNORMAL HIGH (ref 150–400)
RDW: 15.9 % — ABNORMAL HIGH (ref 11.5–15.5)
WBC: 10 10*3/uL (ref 4.0–10.5)

## 2011-11-30 LAB — CULTURE, BLOOD (ROUTINE X 2)

## 2011-11-30 LAB — BASIC METABOLIC PANEL
CO2: 36 mEq/L — ABNORMAL HIGH (ref 19–32)
Calcium: 9.1 mg/dL (ref 8.4–10.5)
Creatinine, Ser: 0.52 mg/dL (ref 0.50–1.10)
GFR calc Af Amer: >90 mL/min (ref >90)
GFR calc non Af Amer: 81 mL/min — ABNORMAL LOW (ref >90)
Sodium: 136 mEq/L (ref 135–145)

## 2011-11-30 LAB — MAGNESIUM: Magnesium: 1.7 mg/dL (ref 1.5–2.5)

## 2011-11-30 MED ORDER — AZITHROMYCIN 500 MG PO TABS
500.0000 mg | ORAL_TABLET | ORAL | Status: DC
  Administered 2011-11-30 – 2011-12-01 (×2): 500 mg via ORAL
  Filled 2011-11-30 (×3): qty 1

## 2011-11-30 MED ORDER — ACETAMINOPHEN 500 MG PO TABS: 1000.0000 mg | ORAL_TABLET | Freq: Once | ORAL | Status: DC

## 2011-11-30 MED ORDER — ASPIRIN 81 MG PO CHEW
81.0000 mg | CHEWABLE_TABLET | Freq: Every day | ORAL | Status: DC
  Administered 2011-11-30 – 2011-12-04 (×5): 81 mg via ORAL
  Filled 2011-11-30 (×5): qty 1

## 2011-11-30 NOTE — Progress Notes (Signed)
Name: Teresa Callahan MRN: [id number] DOB: Jul 02, 1918    LOS: 6  [facility]  History of Present Illness:  This is a highly functional 73 YOWF, who is followed by Urology for chronic UTI. Reports last seen by Dr Sherron Monday on 2/26 for follow up re: urinary  Incontinence. At this time she was placed on nitrofurantoin for possible UTI. She reported spending the greater part of the days on 2/26 thru 3/2 in bed due to nausea, weakness and inability to take in fluids, as well as light headedness when she would try to stand. Per her family she rallied briefly on the am of 3/3, then that afternoon began to develop dry non-productive cough. This was associated with progressive Shortness of breath and temp of 100 the evening of 3/3. She Presented to the ER on 3/4 with resting dyspnea, O2 sats  leukocytosis, lactic acid of 6.5 and CXR with diffuse pulm infiltrates. She was placed on oxygen, given IVFs and empiric antibiotics. PCCM was asked to eval.    Lines / Drains: Right IJ CVL 3/4>>>  Cultures: PCT 3/4: 3.28 Lactate 3/4: 6.5>>>4.1 (after fluid resuscitation in ER) BC X2 3/4>>> Urine strep 3/4>>>neg Urine legionella 3/4>>>neg Influenza PCR 3/4>>>neg 3/5 PCT : 2.08  Antibiotics: levaquin 3/4 (Cover CAP and UTI)>>>3/6 Rocephin 3/4 (CAP)>>>  azithro 3/6>>>  Tests / Events Cortisol 3/4: 24.9 ESR: 3/4: 65 EGDT 3/4>>>:3/5 Echo 3/5>>>normal LV systolic fx, EF 55 to 60%, grade 2 diastolic dysfx  Subjective/interval Weaned to 40% + [place] at 6L/min Negative another 2L last 24h (5L over 48h) Did not use BiPAP last 2 nights   Vital Signs: BP 102/50  Pulse 71  Temp(Src) 97.8 F (36.6 C) (Oral)  Resp 15  Ht 5\' 2"  (1.575 m)  Wt 54.5 kg (120 lb 2.4 oz)  BMI 21.98 kg/m2  SpO2 92% 50% Venturi= 90%   I/O last 3 completed shifts: In: 2658 [P.O.:1680; I.V.:670; IV Piggyback:308] Out: 5855 [Urine:5855]  Physical Examination: General: 78 year old female, no distress.  Neuro:   Awake and oriented w/out any focal deficits HEENT:  Neck veins flat, mm dry  Cardiovascular: rrr Lungs:  Crackles posterior bilaterally Abdomen:  Non-tender, + bowel sounds Musculoskeletal:  Intact.   Ventilator settings: Vent Mode:  [-]  FiO2 (%):  [35 %-50 %] 35 %  Labs and Imaging:    Lab 11/30/11 0535 11/29/11 1730 11/29/11 0430 11/28/11 0540  NA 136 -- 138 137  K 4.7 4.2 4.1 --  CL 96 -- 100 103  CO2 36* -- 32 29  BUN 16 -- 18 18  CREATININE 0.52 -- 0.55 0.60  GLUCOSE 122* -- 112* 122*    Lab 11/30/11 0535 11/29/11 0430 11/28/11 0540  HGB 10.6* 10.0* 9.4*  HCT 30.9* 29.6* 27.6*  WBC 10.0 11.4* 10.2  PLT 522* 478* 409*    Lab 11/25/11 1215 11/25/11 0130 11/24/11 1130  CKTOTAL 37 51 30  CKMB 5.4* 7.9* 5.3*  TROPONINI 0.43* 0.58* 0.48*     Lab 11/29/11 0430 11/25/11 0400 11/24/11 1221  PROBNP 5864.0* 10302.0* 8742.0*   PCXR 3/9: No significant change in B infiltrates over last 72 hours   Assessment and Plan:  Acute respiratory failure in the setting bilateral infiltrates: working dx is pneumonitis (either post-viral or nitrofurantoin induced) or ALI. Now probably complicated by iatrogenic volume overload. Slow improvement in Oxygen requirements. CXR slightly better with diuretics Plan: -wean FIO2, sats > 90% cont NIPPV PRN -scheduled Nebs -empiric CAP coverage (day 7 abx  3/10) -continue steroid trial, currently 80mg  solumedrol q12h -diuresis; tolerating with some evolving metabolic alkalosis  Elevated Cardiac enzymes/demand ischemia: prelim ECHO w/ good LV fxn, and gd 2 diastolic dysfunction Plan: -asa daily  Hypothyroidism Lab Results  Component Value Date   TSH 2.513 11/24/2011  Plan -continue synthroid  Protein-calorie Malnutrition Plan -advance diet as tolerated>>change to full liquid 3/8  Best practices / Disposition: -->ICU status under PCCM -->Limited code. Short term intubation, pressors no CPR, defib or pacemaker.  -->Heparin for DVT  Px -->Protonix for GI Px -->diet: clears -->family updated at bedside   Levy Pupa, MD, PhD 11/30/2011, 9:53 AM [facility] [phone] or if no answer [phone]

## 2011-11-30 NOTE — Progress Notes (Signed)
PHARMACIST - PHYSICIAN COMMUNICATION DR: CCM CONCERNING: Antibiotic IV to Oral Route Change Policy  RECOMMENDATION: This patient is receiving Azithromycin by the intravenous route.  Based on criteria approved by the Pharmacy and Therapeutics Committee, the antibiotic(s) is/are being converted to the equivalent oral dose form(s).   DESCRIPTION: These criteria include:  Patient being treated for a respiratory tract infection, urinary tract infection, or cellulitis  The patient is not neutropenic and does not exhibit a GI malabsorption state  The patient is eating (either orally or via tube) and/or has been taking other orally administered medications for a least 24 hours  The patient is improving clinically and has a Tmax < 100.5  If you have questions about this conversion, please contact the Pharmacy Department  []   [phone] )  [facility] []   [phone] )  [facility]  []   [phone] )  [facility] [x]   [phone] )  [facility]   Thank you, Loralee Pacas, PharmD, BCPS 11/30/2011, 11:13 AM

## 2011-11-30 NOTE — Progress Notes (Signed)
eLink Physician-Brief Progress Note Patient Name: Teresa Callahan DOB: 12-Nov-1917 MRN: [id number]  Date of Service  11/30/2011   HPI/Events of Note   1. RN sys headache and patinet asking for tylenol  Lab 11/30/11 0535 11/29/11 1730 11/29/11 0430 11/28/11 0540 11/27/11 0958 11/26/11 0430 11/24/11 1955  NA 136 -- 138 137 136 136 --  K 4.7 4.2 -- -- -- -- --  CL 96 -- 100 103 103 104 --  CO2 36* -- 32 29 24 25  --  GLUCOSE 122* -- 112* 122* 142* 139* --  BUN 16 -- 18 18 17 17  --  CREATININE 0.52 -- 0.55 0.60 0.59 0.61 --  CALCIUM 9.1 -- 9.0 8.7 9.2 8.9 --  MG 1.7 -- -- -- -- -- 1.3*  PHOS -- -- -- -- -- -- 3.4   2. RN says notes say daily tylenol but patient not on it. No bleeding issues  eICU Interventions  1. 1gm tylenol x 1 2. Start daily 81mg  aspiriin   Intervention Category Intermediate Interventions: Pain - evaluation and management;Other:;Communication with other healthcare providers and/or family;Medication change / dose adjustment  [name] 11/30/2011, 6:06 PM

## 2011-12-01 ENCOUNTER — Inpatient Hospital Stay (HOSPITAL_COMMUNITY): Payer: Medicare Other

## 2011-12-01 DIAGNOSIS — J9600 Acute respiratory failure, unspecified whether with hypoxia or hypercapnia: Secondary | ICD-10-CM

## 2011-12-01 LAB — CBC
Hemoglobin: 10.2 g/dL — ABNORMAL LOW (ref 12.0–15.0)
MCHC: 33.4 g/dL (ref 30.0–36.0)
RDW: 16.1 % — ABNORMAL HIGH (ref 11.5–15.5)

## 2011-12-01 LAB — BASIC METABOLIC PANEL
BUN: 15 mg/dL (ref 6–23)
Creatinine, Ser: 0.53 mg/dL (ref 0.50–1.10)
GFR calc Af Amer: >90 mL/min (ref >90)
GFR calc non Af Amer: 81 mL/min — ABNORMAL LOW (ref >90)
Potassium: 4.5 mEq/L (ref 3.5–5.1)

## 2011-12-01 MED ORDER — VITAMINS A & D EX OINT
TOPICAL_OINTMENT | CUTANEOUS | Status: AC
  Administered 2011-12-01: 23:00:00
  Filled 2011-12-01: qty 5

## 2011-12-01 MED ORDER — METHYLPREDNISOLONE SODIUM SUCC 125 MG IJ SOLR: 60.0000 mg | Freq: Two times a day (BID) | INTRAMUSCULAR | Status: DC

## 2011-12-01 MED ORDER — PANTOPRAZOLE SODIUM 40 MG PO TBEC
40.0000 mg | DELAYED_RELEASE_TABLET | Freq: Two times a day (BID) | ORAL | Status: DC
  Administered 2011-12-01 – 2011-12-04 (×6): 40 mg via ORAL
  Filled 2011-12-01 (×8): qty 1

## 2011-12-01 MED ORDER — METHYLPREDNISOLONE SODIUM SUCC 40 MG IJ SOLR
40.0000 mg | Freq: Two times a day (BID) | INTRAMUSCULAR | Status: DC
  Administered 2011-12-01: 40 mg via INTRAVENOUS
  Filled 2011-12-01 (×3): qty 1

## 2011-12-01 NOTE — Progress Notes (Signed)
Name: Teresa Callahan MRN: [id number] DOB: [birth year]    LOS: 7  [place] Pulmonary/Critical Care  Brief patient profile:  57 YOWF, who is followed by Urology for chronic UTI. Reports last seen by Dr Sherron Monday on 2/26 for follow up re: urinary  Incontinence. At this time she was placed on nitrofurantoin for possible UTI. She reported spending the greater part of the days on 2/26 thru 3/2 in bed due to nausea, weakness and inability to take in fluids, as well as light headedness when she would try to stand. Per her family she rallied briefly on the am of 3/3, then that afternoon began to develop dry non-productive cough. This was associated with progressive Shortness of breath and temp of 100 the evening of 3/3. She Presented to the ER on 3/4 with resting dyspnea, O2 sats  leukocytosis, lactic acid of 6.5 and CXR with diffuse pulm infiltrates. She was placed on oxygen, given IVFs and empiric antibiotics. PCCM was asked to eval.    Lines / Drains: Right IJ CVL 3/4>>>  Cultures: PCT 3/4:  3.28 Lactate 3/4: 6.5>>>4.1 (after fluid resuscitation in ER) BC X2 3/4>>>neg Urine strep 3/4>>>neg Urine legionella 3/4>>>neg Influenza PCR 3/4>>>neg 3/5 PCT : 2.08 3/5 Urine > neg  Antibiotics: levaquin 3/4 (Cover CAP and UTI)>>>3/6 Rocephin 3/4 (CAP)>>>  azithro 3/6>>>  Tests / Events Cortisol 3/4: 24.9 ESR: 3/4: 65 EGDT 3/4>>> 3/5 Echo 3/5>>>normal LV systolic fx, EF 55 to 60%, grade 2 diastolic dysfx  Subjective/interval NAD   Vital Signs: BP 107/42  Pulse 71  Temp(Src) 98 F (36.7 C) (Oral)  Resp 16  Ht 5\' 2"  (1.575 m)  Wt 130 lb 8.2 oz (59.2 kg)  BMI 23.87 kg/m2  SpO2 92%  @ 6lpm    Intake/Output Summary (Last 24 hours) at 12/01/11 1344 Last data filed at 12/01/11 1300  Gross per 24 hour  Intake   1459 ml  Output   4805 ml  Net  -3346 ml    Physical Examination: General: very elderly frail  female, no distress.  Neuro:  Awake and oriented w/out any focal deficits HEENT:   Neck veins flat, mm dry  Cardiovascular: rrr Lungs:  Crackles posterior bilaterally Abdomen:  Non-tender, + bowel sounds Musculoskeletal:  Intact.   Ventilator settings:    Labs and Imaging:    Lab 12/01/11 0430 11/30/11 0535 11/29/11 1730 11/29/11 0430  NA 135 136 -- 138  K 4.5 4.7 4.2 --  CL 94* 96 -- 100  CO2 37* 36* -- 32  BUN 15 16 -- 18  CREATININE 0.53 0.52 -- 0.55  GLUCOSE 121* 122* -- 112*    Lab 12/01/11 0430 11/30/11 0535 11/29/11 0430  HGB 10.2* 10.6* 10.0*  HCT 30.5* 30.9* 29.6*  WBC 10.7* 10.0 11.4*  PLT 586* 522* 478*    Lab 11/25/11 1215 11/25/11 0130 11/24/11 1130  CKTOTAL 37 51 30  CKMB 5.4* 7.9* 5.3*  TROPONINI 0.43* 0.58* 0.48*     Lab 11/29/11 0430 11/25/11 0400 11/24/11 1221  PROBNP 5864.0* 10302.0* 8742.0*   Dg Chest Port 1 View  12/01/2011  *RADIOLOGY REPORT*  Clinical Data: ET tube.  Respiratory distress.  PORTABLE CHEST - 1 VIEW  Comparison: 11/30/2011  Findings: Right central line is unchanged.  Continued diffuse bilateral airspace disease and probable small effusions.  Suspect underlying COPD.  IMPRESSION: No significant change.  Original Report Authenticated By: Cyndie Chime, M.D.   Dg Chest Port 1 View  11/30/2011  *RADIOLOGY REPORT*  Clinical Data:  Evaluate endotracheal tube placement.  PORTABLE CHEST - 1 VIEW  Comparison: Chest x-ray 11/29/2011.  Findings: No endotracheal tube is noted.  Right internal jugular central venous catheter with tip terminating in the distal superior vena cava is noted.  The lung volumes are low.  There are persistent bibasilar opacities in large part related to underlying bibasilar subsegmental atelectasis with associated small bilateral (right greater than left) pleural effusions.  The remainder of the lungs there is a diffuse pattern of interstitial prominence and patchy airspace disease (right greater than left).  Underlying pulmonary vasculature is largely obscured.  Heart size is within normal limits.   Mediastinal contours are unremarkable. Atherosclerosis in the thoracic aorta.  IMPRESSION: 1.  Support apparatus, as above.  No endotracheal tube noted. 2.  Slight improvement in multi focal interstitial and airspace disease throughout the lungs bilaterally (right greater than left). Findings may again reflect moderate - severe pulmonary edema and/or multilobar pneumonia.  Clinical correlation is recommended. 3.  Persistent small bilateral (right greater than left) pleural effusions with bibasilar atelectasis. 4.  Atherosclerosis.  Original Report Authenticated By: Florencia Reasons, M.D.      Assessment and Plan:  Acute respiratory failure in the setting bilateral infiltrates: working dx is pneumonitis (either post-viral or nitrofurantoin induced) or ALI. Now probably complicated by iatrogenic volume overload. Slow improvement in Oxygen requirements. CXR slightly better with diuretics Plan: -wean FIO2  -scheduled Nebs -empiric CAP coverage per dashboard -continue steroid trial reduced to solumedrol 40  q12h 3/11 -diuresis; tolerating with some evolving metabolic alkalosis  Elevated Cardiac enzymes/demand ischemia: prelim ECHO w/ good LV fxn, and gd 2 diastolic dysfunction Plan: -asa daily  Hypothyroidism Lab Results  Component Value Date   TSH 2.513 11/24/2011  Plan -continue synthroid  Protein-calorie Malnutrition Plan -advance diet as tolerated>>change to full liquid 3/8  Best practices / Disposition: -->ICU status under PCCM -->Limited code. Short term intubation, pressors no CPR, defib or pacemaker.  -->Heparin for DVT Px -->Protonix for GI Px -->diet: clears    Brett Canales Minor ACNP [facility] PCCM Pager [phone] till 3 pm If no answer page [phone] 12/01/2011, 11:02 AM  Pt independently  seen and examined and available cxr's reviewed and I agree with above findings/ imp/ plan   Sandrea Hughs, MD Pulmonary and Critical Care Medicine [facility] Cell [phone]

## 2011-12-01 NOTE — Progress Notes (Signed)
CSW met with Pt to check in and assess needs. Pt reports to live alone and was doing well with her ADLs prior to admission. Pt's son lives in [place] and daughter is out of town. CSW will continue to reach out to family to check in. They come in the afternoon after CSW has left.  Teresa Callahan, [place] 12/01/2011 10:53 AM [phone]

## 2011-12-02 ENCOUNTER — Inpatient Hospital Stay (HOSPITAL_COMMUNITY): Payer: Medicare Other

## 2011-12-02 LAB — BASIC METABOLIC PANEL
CO2: 35 mEq/L — ABNORMAL HIGH (ref 19–32)
Chloride: 95 mEq/L — ABNORMAL LOW (ref 96–112)
Sodium: 134 mEq/L — ABNORMAL LOW (ref 135–145)

## 2011-12-02 LAB — CBC
Platelets: 634 10*3/uL — ABNORMAL HIGH (ref 150–400)
RBC: 3.45 MIL/uL — ABNORMAL LOW (ref 3.87–5.11)
WBC: 9.9 10*3/uL (ref 4.0–10.5)

## 2011-12-02 LAB — MAGNESIUM: Magnesium: 1.8 mg/dL (ref 1.5–2.5)

## 2011-12-02 MED ORDER — FUROSEMIDE 40 MG PO TABS
40.0000 mg | ORAL_TABLET | Freq: Two times a day (BID) | ORAL | Status: DC
  Filled 2011-12-02 (×2): qty 1

## 2011-12-02 MED ORDER — SODIUM CHLORIDE 0.9 % IJ SOLN
10.0000 mL | INTRAMUSCULAR | Status: DC | PRN
  Administered 2011-12-02: 10 mL

## 2011-12-02 MED ORDER — POTASSIUM CHLORIDE 20 MEQ/15ML (10%) PO LIQD
20.0000 meq | Freq: Two times a day (BID) | ORAL | Status: DC
  Administered 2011-12-02 – 2011-12-03 (×3): 20 meq via ORAL
  Filled 2011-12-02 (×4): qty 15

## 2011-12-02 MED ORDER — PREDNISONE 20 MG PO TABS
40.0000 mg | ORAL_TABLET | Freq: Every day | ORAL | Status: DC
  Administered 2011-12-02 – 2011-12-04 (×3): 40 mg via ORAL
  Filled 2011-12-02 (×3): qty 2

## 2011-12-02 MED ORDER — FUROSEMIDE 40 MG PO TABS
40.0000 mg | ORAL_TABLET | Freq: Two times a day (BID) | ORAL | Status: DC
  Administered 2011-12-02: 40 mg via ORAL
  Filled 2011-12-02 (×3): qty 1

## 2011-12-02 NOTE — Evaluation (Signed)
Occupational Therapy Evaluation Patient Details Name: Teresa Callahan MRN: [id number] DOB: July 18, 1918 Today's Date: 12/02/2011  Problem List:  Patient Active Problem List  Diagnoses  . ANEMIA, IRON DEFICIENCY  . ANGIODYSPLASIA-INTESTINE  . FECAL OCCULT BLOOD  . Acute respiratory failure  . Pulmonary infiltrates  . Dehydration  . Hyponatremia  . Acute renal failure  . Lactic acidosis  . Leukocytosis  . Chronic UTI  . Demand ischemia  . Shock circulatory    Past Medical History:  Past Medical History  Diagnosis Date  . UTI (lower urinary tract infection)   . Asthma   . Thyroid disease   . Diverticulosis   . Anemia   . Carotid stenosis   . Vertigo   . GERD (gastroesophageal reflux disease)    Past Surgical History:  Past Surgical History  Procedure Date  . Abdominal hysterectomy   . Left eye surgery 02/02/2006  . Colonoscopy 09/30/2001    OT Assessment/Plan/Recommendation OT Assessment Clinical Impression Statement: pt will benefit from skilled OT services to increase her independence for next venue of care. OT Recommendation/Assessment: Patient will need skilled OT in the acute care venue OT Problem List: Decreased strength;Decreased knowledge of use of DME or AE;Decreased activity tolerance OT Therapy Diagnosis : Generalized weakness OT Plan OT Frequency: Min 1X/week OT Treatment/Interventions: Self-care/ADL training;Therapeutic activities;DME and/or AE instruction;Patient/family education OT Recommendation Follow Up Recommendations: Skilled nursing facility Equipment Recommended: Defer to next venue Individuals Consulted Consulted and Agree with Results and Recommendations: Patient OT Goals Acute Rehab OT Goals OT Goal Formulation: With patient Time For Goal Achievement: 2 weeks ADL Goals Pt Will Perform Grooming: with supervision;Standing at sink ADL Goal: Grooming - Progress: Goal set today Pt Will Perform Upper Body Bathing: with supervision;Other  (comment);Sitting, edge of bed;Sitting, chair (own setup) ADL Goal: Upper Body Bathing - Progress: Goal set today Pt Will Perform Lower Body Bathing: with supervision;Sit to stand from chair;Sit to stand from bed ADL Goal: Lower Body Bathing - Progress: Goal set today Pt Will Perform Upper Body Dressing: with supervision;Other (comment) (own setup) ADL Goal: Upper Body Dressing - Progress: Goal set today Pt Will Perform Lower Body Dressing: with supervision;Sit to stand from chair;Sit to stand from bed ADL Goal: Lower Body Dressing - Progress: Goal set today Pt Will Transfer to Toilet: with supervision;Ambulation;with DME;3-in-1 ADL Goal: Toilet Transfer - Progress: Goal set today Pt Will Perform Toileting - Clothing Manipulation: with supervision;Standing ADL Goal: Toileting - Clothing Manipulation - Progress: Goal set today Pt Will Perform Toileting - Hygiene: with supervision;Sit to stand from 3-in-1/toilet ADL Goal: Toileting - Hygiene - Progress: Goal set today  OT Evaluation Precautions/Restrictions  Precautions Precautions: Fall Restrictions Weight Bearing Restrictions: No Prior Functioning Home Living Lives With: Alone Type of Home: House Home Layout: Two level;Other (Comment) (chair lift) Home Access: Stairs to enter Entrance Stairs-Number of Steps: "a couple' Bathroom Shower/Tub: Walk-in Stage manager: Handicapped height Home Adaptive Equipment: None Prior Function Level of Independence: Independent with basic ADLs;Independent with gait;Independent with transfers;Independent with homemaking with ambulation Driving: Yes ADL ADL Eating/Feeding: Simulated;Independent Where Assessed - Eating/Feeding: Chair Grooming: Simulated;Wash/dry hands;Set up Where Assessed - Grooming: Sitting, chair Upper Body Bathing: Simulated;Chest;Right arm;Left arm;Abdomen;Supervision/safety;Set up Where Assessed - Upper Body Bathing: Sitting, bed;Unsupported Lower Body Bathing:  Simulated;Minimal assistance Where Assessed - Lower Body Bathing: Sit to stand from bed Upper Body Dressing: Simulated;Supervision/safety;Set up Where Assessed - Upper Body Dressing: Sitting, bed;Unsupported Lower Body Dressing: Minimal assistance Where Assessed - Lower Body Dressing: Sit to stand  from bed Toilet Transfer: Performed;Minimal assistance Toilet Transfer Method: Stand pivot Toilet Transfer Equipment: Bedside commode Toileting - Clothing Manipulation: Simulated;Minimal assistance Where Assessed - Glass blower/designer Manipulation: Standing Toileting - Hygiene: Simulated;Minimal assistance Where Assessed - Toileting Hygiene: Standing Tub/Shower Transfer: Not assessed Tub/Shower Transfer Method: Not assessed Equipment Used: Other (comment) (BSC) Vision/Perception  Vision - History Baseline Vision: Wears glasses for distance only Cognition Cognition Arousal/Alertness: Awake/alert Overall Cognitive Status: Appears within functional limits for tasks assessed Sensation/Coordination Sensation Light Touch: Appears Intact Extremity Assessment RUE Assessment RUE Assessment: Within Functional Limits LUE Assessment LUE Assessment: Within Functional Limits Mobility  Transfers Sit to Stand: 4: Min assist;With upper extremity assist;From bed;From chair/3-in-1 Sit to Stand Details (indicate cue type and reason): min verbal cues Stand to Sit: 4: Min assist;Without upper extremity assist;To chair/3-in-1 Stand to Sit Details: min verbal cues Exercises   End of Session OT - End of Session Equipment Utilized During Treatment: Gait belt Activity Tolerance: Other (comment) (decrease in O2 sats) Patient left: in chair;with call bell in reach General Behavior During Session: [facility] for tasks performed Cognition: [facility] for tasks performed   Lennox Laity [phone] 12/02/2011, 11:14 AM

## 2011-12-02 NOTE — Progress Notes (Signed)
Name: Teresa Callahan MRN: [id number] DOB: 01/29/1918    LOS: 8   Pulmonary/Critical Care  Brief patient profile:  89 YOWF diffuse alveolitis ? Macrodantin induced, exacerbated by volume overload.   Lines / Drains: Right IJ CVL 3/4>>>3/12  Cultures: PCT 3/4:  3.28 Lactate 3/4: 6.5>>>4.1 (after fluid resuscitation in ER) BC X2 3/4>>>neg Urine strep 3/4>>>neg Urine legionella 3/4>>>neg Influenza PCR 3/4>>>neg 3/5 PCT : 2.08 3/5 Urine > neg  Antibiotics: levaquin 3/4 (Cover CAP and UTI)>>>3/6 Rocephin 3/4 (CAP)>>> 3/12 azithro 3/6>>>3/12  Tests / Events Cortisol 3/4: 24.9 ESR: 3/4: 65 EGDT 3/4>>> 3/5 Echo 3/5>>>normal LV systolic fx, EF 55 to 60%, grade 2 diastolic dysfx  Subjective/interval NAD, unable to walk due to instability/dizziness.    Vital Signs: BP 105/55  Pulse 72  Temp(Src) 98 F (36.7 C) (Oral)  Resp 18  Ht 5\' 2"  (1.575 m)  Wt 115 lb 15.4 oz (52.6 kg)  BMI 21.21 kg/m2  SpO2 85%  @ 2lpm    Intake/Output Summary (Last 24 hours) at 12/02/11 1142 Last data filed at 12/02/11 1054  Gross per 24 hour  Intake    620 ml  Output   2445 ml  Net  -1825 ml    Physical Examination: General: very elderly frail  female, no distress.  Neuro:  Awake and oriented w/out any focal deficits HEENT:  Neck veins flat, mm dry  Cardiovascular: rrr Lungs:  Crackles posterior bilaterally Abdomen:  Non-tender, + bowel sounds Musculoskeletal:  Intact.       Labs and Imaging:    Lab 12/02/11 0430 12/01/11 0430 11/30/11 0535  NA 134* 135 136  K 4.3 4.5 4.7  CL 95* 94* 96  CO2 35* 37* 36*  BUN 17 15 16   CREATININE 0.48* 0.53 0.52  GLUCOSE 129* 121* 122*    Lab 12/02/11 0430 12/01/11 0430 11/30/11 0535  HGB 10.5* 10.2* 10.6*  HCT 31.2* 30.5* 30.9*  WBC 9.9 10.7* 10.0  PLT 634* 586* 522*    Lab 11/25/11 1215  CKTOTAL 37  CKMB 5.4*  TROPONINI 0.43*     Lab 11/29/11 0430  PROBNP 5864.0*   Dg Chest Port 1 View  12/02/2011  *RADIOLOGY REPORT*   Clinical Data: Edema  PORTABLE CHEST - 1 VIEW  Comparison: Chest radiograph 12/01/2011  Findings: Normal mediastinum and cardiac silhouette.  Right central venous line is unchanged.  There is chronic interstitial lung disease with superimposed air space disease and interstitial edema. There is bilateral small effusions.  No pneumothorax.  IMPRESSION:  1.  No significant change. 2.  Interstitial lung disease with superimposed interstitial edema and air space disease.  Original Report Authenticated By: Genevive Bi, M.D.   Dg Chest Port 1 View  12/01/2011  *RADIOLOGY REPORT*  Clinical Data: ET tube.  Respiratory distress.  PORTABLE CHEST - 1 VIEW  Comparison: 11/30/2011  Findings: Right central line is unchanged.  Continued diffuse bilateral airspace disease and probable small effusions.  Suspect underlying COPD.  IMPRESSION: No significant change.  Original Report Authenticated By: Cyndie Chime, M.D.      Assessment and Plan:  Acute respiratory failure in the setting bilateral infiltrates: working dx is pneumonitis (either post-viral or nitrofurantoin induced) or ALI. Now probably complicated by iatrogenic volume overload. Slow improvement in Oxygen requirements. CXR slightly better with diuretics Plan: -wean FIO2  -scheduled Nebs -stop all ABX 12/02/11 -continue steroid trial change to po pred 40/d 12/02/11 -diuresis, change to po lasix  Elevated Cardiac enzymes/demand ischemia: prelim ECHO w/  good LV fxn, and gd 2 diastolic dysfunction Plan: -asa daily  Hypothyroidism Lab Results  Component Value Date   TSH 2.513 11/24/2011  Plan -continue synthroid  Protein-calorie Malnutrition Plan -diet advanced  Disposition: -3/12 ask SW/CM to arrange for rehab either inpt or at snf -dc cvl 3/12 -dc abx 3/12 -3/12 rehab consult placed   Best practices / Disposition: -->floor status under PCCM -->Limited code. Short term intubation, pressors no CPR, defib or pacemaker.  -->Heparin for  DVT Px -->Protonix for GI Px -->diet: low salt   Brett Canales Minor ACNP [facility] PCCM Pager [phone] till 3 pm If no answer page [phone] 12/02/2011, 11:42 AM  I have seen and examined this patient with the nurse practionner and agree with the above assessment and plan.  I have made notations to the exceptions.  Shan Levans Beeper  [phone]  Cell  [phone]  If no response or cell goes to voicemail, call beeper [phone]

## 2011-12-02 NOTE — Progress Notes (Signed)
CSW spoke with daughter re. Need for SNF. Daughter agrees to bed search in [place] for SNF. CSW will begin bed search. Family interested in [facility]. CSW will provide SNF list. Daughter has HCPOA and will assist with bed choice. CSW will continue to follow and assist.  Vennie Homans, Theresia Majors 12/02/2011 1:14 PM #[phone]

## 2011-12-02 NOTE — Evaluation (Signed)
Physical Therapy Evaluation Patient Details Name: Teresa Callahan MRN: [id number] DOB: 1918-05-15 Today's Date: 12/02/2011  Problem List:  Patient Active Problem List  Diagnoses  . ANEMIA, IRON DEFICIENCY  . ANGIODYSPLASIA-INTESTINE  . FECAL OCCULT BLOOD  . Acute respiratory failure  . Pulmonary infiltrates  . Dehydration  . Hyponatremia  . Acute renal failure  . Lactic acidosis  . Leukocytosis  . Chronic UTI  . Demand ischemia  . Shock circulatory    Past Medical History:  Past Medical History  Diagnosis Date  . UTI (lower urinary tract infection)   . Asthma   . Thyroid disease   . Diverticulosis   . Anemia   . Carotid stenosis   . Vertigo   . GERD (gastroesophageal reflux disease)    Past Surgical History:  Past Surgical History  Procedure Date  . Abdominal hysterectomy   . Left eye surgery 02/02/2006  . Colonoscopy 09/30/2001    PT Assessment/Plan/Recommendation PT Assessment Clinical Impression Statement: Pt presents with diagnosis of actue resp failure. Pt was I with all acitivity prior to admission. Pt now requires Min A for safety with mobility due to weakness and impaired balance. Pt will benefit from skilled PT in acute setting to improve strength, activity tolerance and to maximize independence and safety in preparation for D/C.  PT Recommendation/Assessment: Patient will need skilled PT in the acute care venue PT Problem List: Decreased strength;Decreased activity tolerance;Decreased mobility;Decreased balance;Decreased knowledge of use of DME Barriers to Discharge: Decreased caregiver support PT Therapy Diagnosis : Difficulty walking;Generalized weakness PT Plan PT Frequency: Min 3X/week PT Treatment/Interventions: DME instruction;Gait training;Functional mobility training;Therapeutic activities;Therapeutic exercise;Balance training;Patient/family education PT Recommendation Recommendations for Other Services: OT consult (already on board) Follow Up  Recommendations: Skilled nursing facility;Supervision/Assistance - 24 hour (if D/C home with HHPT) Equipment Recommended:  (to be determined) PT Goals  Acute Rehab PT Goals PT Goal Formulation: With patient Time For Goal Achievement: 7 days Pt will go Supine/Side to Sit: with modified independence PT Goal: Supine/Side to Sit - Progress: Goal set today Pt will go Sit to Supine/Side: with modified independence PT Goal: Sit to Supine/Side - Progress: Goal set today Pt will go Sit to Stand: with modified independence PT Goal: Sit to Stand - Progress: Goal set today Pt will Ambulate: >150 feet;with modified independence PT Goal: Ambulate - Progress: Goal set today  PT Evaluation Precautions/Restrictions  Precautions Precautions: Fall Prior Functioning  Home Living Lives With: Alone Type of Home: House Home Layout: Two level;Other (Comment) (chair lift) Home Access: Stairs to enter Entrance Stairs-Number of Steps: "a couple' Bathroom Shower/Tub: Walk-in Stage manager: Handicapped height Home Adaptive Equipment: None Prior Function Level of Independence: Independent with basic ADLs;Independent with gait;Independent with transfers;Independent with homemaking with ambulation Driving: Yes Cognition Cognition Arousal/Alertness: Awake/alert Overall Cognitive Status: Appears within functional limits for tasks assessed Sensation/Coordination Sensation Light Touch: Appears Intact Coordination Gross Motor Movements are Fluid and Coordinated: Yes Extremity Assessment RUE Assessment RUE Assessment: Within Functional Limits LUE Assessment LUE Assessment: Within Functional Limits RLE Assessment RLE Assessment: Within Functional Limits LLE Assessment LLE Assessment: Within Functional Limits Mobility (including Balance) Bed Mobility Bed Mobility: Yes Supine to Sit: 5: Supervision Transfers Transfers: Yes Sit to Stand: 4: Min assist Sit to Stand Details (indicate cue type  and reason): Asssit to stabilize. VCs safety. Stand to Sit: 4: Min assist Stand to Sit Details: Assist to stabilize. VCs safety.  Ambulation/Gait Ambulation/Gait: Yes Ambulation/Gait Assistance: 4: Min assist Ambulation/Gait Assistance Details (indicate cue type and  reason): Pt very unsteady. O2 sats 84-85% on 2 L O2 during activity.  Ambulation Distance (Feet): 25 Feet Assistive device: 1 person hand held assist Gait Pattern: Step-through pattern;Decreased step length - left;Decreased step length - right;Decreased stride length  Posture/Postural Control Posture/Postural Control: No significant limitations Balance Balance Assessed: No (unsteady) Exercise    End of Session PT - End of Session Equipment Utilized During Treatment: Gait belt Activity Tolerance: Patient tolerated treatment well Patient left: in chair;with call bell in reach General Behavior During Session: [facility] for tasks performed Cognition: [facility] for tasks performed  Rebeca Alert [facility] 12/02/2011, 12:50 PM [phone]

## 2011-12-02 NOTE — Consult Note (Signed)
Physical Medicine and Rehabilitation Consult Reason for Consult: Decontioning Referring Phsyician:  Dr. Craige Cotta    HPI: Teresa Callahan is Teresa 78 y.o. female with chronic UTI and last seen by Dr Sherron Monday on 2/26 for follow up re: urinary Incontinence. At this time she was placed on nitrofurantoin for possible UTI. She reported spending the greater part of the days on 2/26 thru 3/2 in bed due to nausea, weakness and inability to take in fluids, as well as light headedness when she would try to stand. Per her family she rallied briefly on the am of 3/3, then that afternoon began to develop dry non-productive cough. This was associated with progressive Shortness of breath and temp of 100 the evening of 3/3. She Presented to the ER on 3/4 with resting dyspnea, O2 sats leukocytosis, lactic acid of 6.5 and CXR with diffuse pulm infiltrates. She was placed on oxygen, given IVFs and empiric antibiotics. Placed on BIPAP and steroid trial to help with acute respiratory failure due to pneumonitis complicated by iatrogenic volume overload. Elevated cardiac enzymes due to demand ischemia. Respiratory status slowly improving and OT evaluation done today.  Pt deconditioned and noted to have hypoxia with activity.  MD recommending CIR.  Review of Systems  Eyes: Negative for blurred vision.  Respiratory: Positive for shortness of breath. Negative for cough.   Cardiovascular: Negative for chest pain and palpitations.  Genitourinary: Positive for frequency.  Neurological: Positive for weakness.       Balance problems- goes to therapy 3/wk for the past few years.  All other systems reviewed and are negative.   Past Medical History  Diagnosis Date  . UTI (lower urinary tract infection)   . Asthma   . Thyroid disease   . Diverticulosis   . Anemia   . Carotid stenosis   . Vertigo   . GERD (gastroesophageal reflux disease)    Past Surgical History  Procedure Date  . Abdominal hysterectomy   . Left eye surgery  02/02/2006  . Colonoscopy 09/30/2001   History reviewed. No pertinent family history.  Social History:  Lives alone.  Independent for ADL and mobility prior to admission. She reports that she has quit smoking. She has never used smokeless tobacco. She reports that she does not drink alcohol or use illicit drugs.   Allergies  Allergen Reactions  . Horse-Derived Products Other (See Comments)    Loss of appetite  . Sulfa Antibiotics Other (See Comments)    Loss of appetite     Prior to Admission medications   Medication Sig Start Date End Date Taking? Authorizing Provider  albuterol (PROVENTIL HFA;VENTOLIN HFA) 108 (90 BASE) MCG/ACT inhaler Inhale 2 puffs into the lungs every 6 (six) hours as needed. For shortness of breath   Yes Historical Provider, MD  beclomethasone (QVAR) 80 MCG/ACT inhaler Inhale 1 puff into the lungs as needed.   Yes Historical Provider, MD  calcium citrate (CALCITRATE - DOSED IN MG ELEMENTAL CALCIUM) 950 MG tablet Take 1 tablet by mouth daily.   Yes Historical Provider, MD  lansoprazole (PREVACID) 15 MG capsule Take 15 mg by mouth daily.   Yes Historical Provider, MD  levothyroxine (SYNTHROID, LEVOTHROID) 75 MCG tablet Take 75 mcg by mouth daily.   Yes Historical Provider, MD  Multiple Vitamin (MULITIVITAMIN WITH MINERALS) TABS Take 1 tablet by mouth daily.   Yes Historical Provider, MD  naproxen sodium (ANAPROX) 220 MG tablet Take 220 mg by mouth 2 (two) times daily with a meal.   Yes  Historical Provider, MD  nitrofurantoin, macrocrystal-monohydrate, (MACROBID) 100 MG capsule Take 100 mg by mouth 2 (two) times daily.   Yes Historical Provider, MD  phenazopyridine (PYRIDIUM) 100 MG tablet Take 100 mg by mouth every 8 (eight) hours as needed. As needed for burning with urination   Yes Historical Provider, MD  Probiotic Product (ALIGN) 4 MG CAPS Take 1 capsule by mouth daily.   Yes Historical Provider, MD  solifenacin (VESICARE) 5 MG tablet Take 10 mg by mouth daily.   Yes  Historical Provider, MD   Scheduled Medications:    . acetaminophen  1,000 mg Oral Once  . antiseptic oral rinse  15 mL Mouth Rinse q12n4p  . aspirin  81 mg Oral Daily  . chlorhexidine  15 mL Mouth Rinse BID  . docusate sodium  100 mg Oral BID  . furosemide  40 mg Oral BID  . heparin  5,000 Units Subcutaneous Q8H  . levothyroxine  75 mcg Oral QAC breakfast  . pantoprazole  40 mg Oral BID AC  . potassium chloride  20 mEq Oral Q12H  . predniSONE  40 mg Oral Q breakfast  . vitamin A & D      . DISCONTD: azithromycin  500 mg Oral Q24H  . DISCONTD: cefTRIAXone (ROCEPHIN)  IV  1 g Intravenous Q24H  . DISCONTD: furosemide  40 mg Intravenous BID  . DISCONTD: furosemide  40 mg Oral BID  . DISCONTD: methylPREDNISolone (SOLU-MEDROL) injection  60 mg Intravenous Q12H  . DISCONTD: methylPREDNISolone (SOLU-MEDROL) injection  40 mg Intravenous Q12H  . DISCONTD: pantoprazole  40 mg Oral Q1200  . DISCONTD: potassium chloride  20 mEq Per Tube Q12H   PRN MED's: albuterol, magnesium hydroxide, DISCONTD: sodium chloride, DISCONTD: sodium chloride Home: Home Living Lives With: Alone Type of Home: House Home Layout: Two level;Other (Comment) (chair lift) Home Access: Stairs to enter Entrance Stairs-Number of Steps: "a couple' Bathroom Shower/Tub: Walk-in Stage manager: Handicapped height Home Adaptive Equipment: None  Functional History: Prior Function Level of Independence: Independent with basic ADLs;Independent with gait;Independent with transfers;Independent with homemaking with ambulation Driving: Yes Functional Status:  Mobility:   Transfers Sit to Stand: 4: Min assist;With upper extremity assist;From bed;From chair/3-in-1 Sit to Stand Details (indicate cue type and reason): min verbal cues Stand to Sit: 4: Min assist;Without upper extremity assist;To chair/3-in-1 Stand to Sit Details: min verbal cues      ADL: ADL Eating/Feeding: Simulated;Independent Where Assessed -  Eating/Feeding: Chair Grooming: Simulated;Wash/dry hands;Set up Where Assessed - Grooming: Sitting, chair Upper Body Bathing: Simulated;Chest;Right arm;Left arm;Abdomen;Supervision/safety;Set up Where Assessed - Upper Body Bathing: Sitting, bed;Unsupported Lower Body Bathing: Simulated;Minimal assistance Where Assessed - Lower Body Bathing: Sit to stand from bed Upper Body Dressing: Simulated;Supervision/safety;Set up Where Assessed - Upper Body Dressing: Sitting, bed;Unsupported Lower Body Dressing: Minimal assistance Where Assessed - Lower Body Dressing: Sit to stand from bed Toilet Transfer: Performed;Minimal assistance Toilet Transfer Method: Stand pivot Toilet Transfer Equipment: Bedside commode Toileting - Clothing Manipulation: Simulated;Minimal assistance Where Assessed - Glass blower/designer Manipulation: Standing Toileting - Hygiene: Simulated;Minimal assistance Where Assessed - Toileting Hygiene: Standing Tub/Shower Transfer: Not assessed Tub/Shower Transfer Method: Not assessed Equipment Used: Other (comment) (BSC)  Cognition: Cognition Arousal/Alertness: Awake/alert Cognition Arousal/Alertness: Awake/alert Overall Cognitive Status: Appears within functional limits for tasks assessed  Blood pressure 105/55, pulse 72, temperature 98 F (36.7 C), temperature source Oral, resp. rate 18, height 5\' 2"  (1.575 m), weight 52.6 kg (115 lb 15.4 oz), SpO2 85.00%. Physical Exam  Constitutional: She is  oriented to person, place, and time and well-developed, well-nourished, and in no distress.  HENT:  Head: Normocephalic and atraumatic.  Mouth/Throat: Abnormal dentition.  Eyes: EOM are normal. Pupils are equal, round, and reactive to light.  Neck: Normal range of motion. Neck supple.  Cardiovascular: Normal rate and regular rhythm.   Pulmonary/Chest: Effort normal.  Abdominal: Soft. Bowel sounds are normal.  Neurological: She is alert and oriented to person, place, and time.        Cognitively appropriate.  Moves all 4's equally (4/5), perhaps a bit more weak proximally.  No gross sensory deficits.  Very alert  Skin: Skin is warm and dry.    Results for orders placed during the hospital encounter of 11/24/11 (from the past 24 hour(s))  CBC     Status: Abnormal   Collection Time   12/02/11  4:30 AM      Component Value Range   WBC 9.9  4.0 - 10.5 (K/uL)   RBC 3.45 (*) 3.87 - 5.11 (MIL/uL)   Hemoglobin 10.5 (*) 12.0 - 15.0 (g/dL)   HCT 16.1 (*) 09.6 - 46.0 (%)   MCV 90.4  78.0 - 100.0 (fL)   MCH 30.4  26.0 - 34.0 (pg)   MCHC 33.7  30.0 - 36.0 (g/dL)   RDW 04.5 (*) 40.9 - 15.5 (%)   Platelets 634 (*) 150 - 400 (K/uL)  BASIC METABOLIC PANEL     Status: Abnormal   Collection Time   12/02/11  4:30 AM      Component Value Range   Sodium 134 (*) 135 - 145 (mEq/L)   Potassium 4.3  3.5 - 5.1 (mEq/L)   Chloride 95 (*) 96 - 112 (mEq/L)   CO2 35 (*) 19 - 32 (mEq/L)   Glucose, Bld 129 (*) 70 - 99 (mg/dL)   BUN 17  6 - 23 (mg/dL)   Creatinine, Ser 8.11 (*) 0.50 - 1.10 (mg/dL)   Calcium 9.0  8.4 - 91.4 (mg/dL)   GFR calc non Af Amer 83 (*) >90 (mL/min)   GFR calc Af Amer >90  >90 (mL/min)  PHOSPHORUS     Status: Normal   Collection Time   12/02/11  4:30 AM      Component Value Range   Phosphorus 3.1  2.3 - 4.6 (mg/dL)  MAGNESIUM     Status: Normal   Collection Time   12/02/11  4:30 AM      Component Value Range   Magnesium 1.8  1.5 - 2.5 (mg/dL)  BLOOD GAS, ARTERIAL     Status: Abnormal (Preliminary result)   Collection Time   12/02/11  5:46 AM      Component Value Range   O2 Content 4.0     Delivery systems NASAL CANNULA     pH, Arterial 7.467 (*) 7.350 - 7.400    pCO2 arterial 47.2 (*) 35.0 - 45.0 (mmHg)   pO2, Arterial 77.5 (*) 80.0 - 100.0 (mmHg)   Bicarbonate 33.6 (*) 20.0 - 24.0 (mEq/L)   TCO2 30.7  0 - 100 (mmol/L)   Acid-Base Excess 9.1 (*) 0.0 - 2.0 (mmol/L)   O2 Saturation 99.0     Patient temperature 98.6     Collection site BRACHIAL ARTERY      Drawn by [id number]     Sample type ARTERIAL DRAW     Allens test (pass/fail) PENDING  PASS    Dg Chest Port 1 View  12/02/2011  *RADIOLOGY REPORT*  Clinical Data: Edema  PORTABLE CHEST -  1 VIEW  Comparison: Chest radiograph 12/01/2011  Findings: Normal mediastinum and cardiac silhouette.  Right central venous line is unchanged.  There is chronic interstitial lung disease with superimposed air space disease and interstitial edema. There is bilateral small effusions.  No pneumothorax.  IMPRESSION:  1.  No significant change. 2.  Interstitial lung disease with superimposed interstitial edema and air space disease.  Original Report Authenticated By: Genevive Bi, M.D.   Dg Chest Port 1 View  12/01/2011  *RADIOLOGY REPORT*  Clinical Data: ET tube.  Respiratory distress.  PORTABLE CHEST - 1 VIEW  Comparison: 11/30/2011  Findings: Right central line is unchanged.  Continued diffuse bilateral airspace disease and probable small effusions.  Suspect underlying COPD.  IMPRESSION: No significant change.  Original Report Authenticated By: Cyndie Chime, M.D.    Assessment/Plan: Diagnosis: deconditioning, respiratory failure 1. Does the need for close, 24 hr/day medical supervision in concert with the patient's rehab needs make it unreasonable for this patient to be served in a less intensive setting? No 2. Co-Morbidities requiring supervision/potential complications: see above 3. Due to bladder management and bowel management, does the patient require 24 hr/day rehab nursing? No 4. Does the patient require coordinated care of a physician, rehab nurse, PT and OT to address physical and functional deficits in the context of the above medical diagnosis(es)? No Addressing deficits in the following areas: balance, locomotion, strength, transferring, dressing and grooming 5. Can the patient actively participate in [facility] intensive therapy program of at least 3 hrs of therapy per day at least 5 days per week? No 6. The  potential for patient to make measurable gains while on inpatient rehab is fair 7. Anticipated functional outcomes upon discharge from inpatients are Not app 8. Estimated rehab length of stay to reach the above functional goals is: not app 9. Does the patient have adequate social supports to accommodate these discharge functional goals? NO 10. Anticipated D/C setting: Home eventually 11. Anticipated post D/C treatments: HH therapy 12. Overall Rehab/Functional Prognosis: good  RECOMMENDATIONS: This patient's condition is appropriate for continued rehabilitative care in the following setting: SNF Patient has agreed to participate in recommended program. Yes Note that insurance prior authorization may be required for reimbursement for recommended care.  Comment: Pt lacks social supports to return home after a potentialy brief CIR stay.  SNF is most appropriate   Teresa Broad, MD 12/02/2011

## 2011-12-03 ENCOUNTER — Inpatient Hospital Stay (HOSPITAL_COMMUNITY): Payer: Medicare Other

## 2011-12-03 DIAGNOSIS — J9600 Acute respiratory failure, unspecified whether with hypoxia or hypercapnia: Secondary | ICD-10-CM

## 2011-12-03 DIAGNOSIS — R5381 Other malaise: Secondary | ICD-10-CM

## 2011-12-03 LAB — BLOOD GAS, ARTERIAL
Drawn by: 307971
O2 Content: 4 L/min
pCO2 arterial: 47.2 mmHg — ABNORMAL HIGH (ref 35.0–45.0)
pO2, Arterial: 77.5 mmHg — ABNORMAL LOW (ref 80.0–100.0)

## 2011-12-03 LAB — CBC
Hemoglobin: 10.2 g/dL — ABNORMAL LOW (ref 12.0–15.0)
MCH: 30.5 pg (ref 26.0–34.0)
MCV: 90.7 fL (ref 78.0–100.0)
Platelets: 619 10*3/uL — ABNORMAL HIGH (ref 150–400)
RBC: 3.34 MIL/uL — ABNORMAL LOW (ref 3.87–5.11)
WBC: 9.5 10*3/uL (ref 4.0–10.5)

## 2011-12-03 LAB — BASIC METABOLIC PANEL
CO2: 35 mEq/L — ABNORMAL HIGH (ref 19–32)
GFR calc non Af Amer: 79 mL/min — ABNORMAL LOW (ref >90)
Glucose, Bld: 71 mg/dL (ref 70–99)
Potassium: 3.9 mEq/L (ref 3.5–5.1)
Sodium: 134 mEq/L — ABNORMAL LOW (ref 135–145)

## 2011-12-03 NOTE — Progress Notes (Signed)
LM for Pt's daughter, Holly Bodily at [phone], stating that [facility] will be by to Ax Pt for their facility this afternoon.  CSW to continue to follow.  Providence Crosby, LCSWA Clinical Social Work [phone]

## 2011-12-03 NOTE — Clinical Documentation Improvement (Signed)
CHF DOCUMENTATION CLARIFICATION QUERY  THIS DOCUMENT IS NOT A PERMANENT PART OF THE MEDICAL RECORD  TO RESPOND TO THE THIS QUERY, FOLLOW THE INSTRUCTIONS BELOW:  1. If needed, update documentation for the patient's encounter via the notes activity.  2. Access this query again and click edit on the In [employer].  3. After updating, or not, click F2 to complete all highlighted (required) fields concerning your review. Select "additional documentation in the medical record" OR "no additional documentation provided".  4. Click Sign note button.  5. The deficiency will fall out of your In Basket *Please let us know if you are not able to complete this workflow by phone or e-mail (listed below).  Please update your documentation within the medical record to reflect your response to this query.                                                                                    12/03/11  Dear Dr.Joani Cosma, [facility],  In a better effort to capture your patient's severity of illness, reflect appropriate length of stay and utilization of resources, a review of the patient medical record has revealed the following indicators the diagnosis of Heart Failure.    Based on your clinical judgment, please clarify and document in a progress note and/or discharge summary the clinical condition associated with the following supporting information:  In responding to this query please exercise your independent judgment.  The fact that a query is asked, does not imply that any particular answer is desired or expected.  Pt admitted with UTI, Acute Resp Failure, Sepsis,   According to pn pt with Elevated Cardiac enzymes/demand ischemia.   Please clarify if  Elevated Cardiac enzymes/demand ischemia can be further specified as one of the diagnosis listed below and document in pn or d/c summary.    Possible Clinical Conditions?  NSTEMI Acute Systolic Congestive Heart Failure Acute Diastolic Congestive  Heart Failure Acute Systolic & Diastolic Congestive Heart Failure Acute on Chronic Systolic Congestive Heart Failure Acute on Chronic Diastolic Congestive Heart Failure Acute on Chronic Systolic & Diastolic  Congestive Heart Failure Other Condition________________________________________ Cannot Clinically Determine  Supporting Information: PN 11/25/11 Elevated Cardiac enzymes/demand ischemia:    Risk Factors:  UTI, Acute Resp Failure, Sepsis,   Signs & Symptoms:   Diagnostics Component     Latest Ref Rng 11/29/2011  Pro B Natriuretic peptide (BNP)     0 - 450 pg/mL 5864.0 (H)   Echo ejection fraction was in the range of 55% to 60%   Treatment: Plan: -trend CEs -asa daily furosemide (LASIX) tablet 40 mg   Reviewed:  no additional documentation provided  Thank You,  Enis Slipper  RN, BSN, CCDS Clinical Documentation Specialist [facility] Dept Pager: [phone] / E-mail: [email or web address]  Health Information Management [place]

## 2011-12-03 NOTE — Progress Notes (Signed)
Name: Teresa Callahan MRN: [id number] DOB: 1918-07-27    LOS: 9  [facility] Progress Note  Brief patient profile:  89 YOWF diffuse alveolitis ? Macrodantin induced, exacerbated by volume overload.   Lines / Drains: Right IJ CVL 3/4>>>3/12  Cultures: PCT 3/4:  3.28 Lactate 3/4: 6.5>>>4.1 (after fluid resuscitation in ER) BC X2 3/4>>>neg Urine strep 3/4>>>neg Urine legionella 3/4>>>neg Influenza PCR 3/4>>>neg 3/5 PCT : 2.08 3/5 Urine > neg  Antibiotics: levaquin 3/4 (Cover CAP and UTI)>>>3/6 Rocephin 3/4 (CAP)>>> 3/12 azithro 3/6>>>3/12  Tests / Events Cortisol 3/4: 24.9 ESR: 3/4: 65 EGDT 3/4>>> 3/5 Echo 3/5>>>normal LV systolic fx, EF 55 to 60%, grade 2 diastolic dysfx  Subjective/interval NAD, feels much better today. Prob at dry weight   Vital Signs: BP 135/52  Pulse 72  Temp(Src) 97.4 F (36.3 C) (Oral)  Resp 18  Ht 5\' 2"  (1.575 m)  Wt 113 lb 12.1 oz (51.6 kg)  BMI 20.81 kg/m2  SpO2 91%  @ 2lpm    Intake/Output Summary (Last 24 hours) at 12/03/11 1227 Last data filed at 12/03/11 0900  Gross per 24 hour  Intake    480 ml  Output   1250 ml  Net   -770 ml    Physical Examination: General: very elderly frail  female, no distress.  Neuro:  Awake and oriented w/out any focal deficits HEENT:  Neck veins flat, mm dry  Cardiovascular: rrr Lungs:Decreased  crackles posterior bilaterally Abdomen:  Non-tender, + bowel sounds Musculoskeletal:  Intact.       Labs and Imaging:    Lab 12/03/11 0445 12/02/11 0430 12/01/11 0430  NA 134* 134* 135  K 3.9 4.3 4.5  CL 96 95* 94*  CO2 35* 35* 37*  BUN 16 17 15   CREATININE 0.57 0.48* 0.53  GLUCOSE 71 129* 121*    Lab 12/03/11 0445 12/02/11 0430 12/01/11 0430  HGB 10.2* 10.5* 10.2*  HCT 30.3* 31.2* 30.5*  WBC 9.5 9.9 10.7*  PLT 619* 634* 586*      Lab 11/29/11 0430  PROBNP 5864.0*   Dg Chest Port 1 View  12/03/2011  *RADIOLOGY REPORT*  Clinical Data: Follow up infiltrates   PORTABLE CHEST - 1 VIEW  Comparison: 12/02/2011  Findings: Cardiomediastinal silhouette is stable.  Stable central interstitial prominence bilateral probable edema or pneumonitis superimposed on chronic interstitial lung disease.  Small left pleural effusion with left basilar atelectasis or infiltrate. Degenerative changes bilateral shoulders again noted right greater than left.  IMPRESSION: Stable central interstitial prominence bilateral probable edema or pneumonitis superimposed on chronic interstitial lung disease. Small left pleural effusion with left basilar atelectasis or infiltrate.  Original Report Authenticated By: Natasha Mead, M.D.   Dg Chest Port 1 View  12/02/2011  *RADIOLOGY REPORT*  Clinical Data: Edema  PORTABLE CHEST - 1 VIEW  Comparison: Chest radiograph 12/01/2011  Findings: Normal mediastinum and cardiac silhouette.  Right central venous line is unchanged.  There is chronic interstitial lung disease with superimposed air space disease and interstitial edema. There is bilateral small effusions.  No pneumothorax.  IMPRESSION:  1.  No significant change. 2.  Interstitial lung disease with superimposed interstitial edema and air space disease.  Original Report Authenticated By: Genevive Bi, M.D.      Assessment and Plan:  Acute respiratory failure in the setting bilateral infiltrates: working dx is pneumonitis (either post-viral or nitrofurantoin induced) or ALI. Now probably complicated by iatrogenic volume overload. Slow improvement in Oxygen requirements.CXR better with diuresis Plan: -wean FIO2  -  scheduled Nebs -off all ABX -continue steroid trial, taper prednisone down by 10mg   Every 5 days until off  -d/c lasix  Elevated Cardiac enzymes/demand ischemia: prelim ECHO w/ good LV fxn, and gd 2 diastolic dysfunction Plan: -asa daily  Hypothyroidism Lab Results  Component Value Date   TSH 2.513 11/24/2011  Plan -continue synthroid  Protein-calorie Malnutrition Plan -diet  advanced  Disposition: Pt not a CIR candidate, plan will be for SNF placement,  Pt hopes will be [employer] / Disposition: -->floor status under PCCM -->Limited code. Short term intubation, pressors no CPR, defib or pacemaker.  -->Heparin for DVT Px -->Protonix for GI Px -->diet: low salt   Brett Canales Minor ACNP [facility] PCCM Pager [phone] till 3 pm If no answer page [phone] 12/03/2011, 12:27 PM   I have seen and examined this patient with the nurse practionner and agree with the above assessment and plan.  I have made notations to the exceptions.  Shan Levans Beeper  [phone]  Cell  [phone]  If no response or cell goes to voicemail, call beeper [phone]

## 2011-12-04 DIAGNOSIS — J9600 Acute respiratory failure, unspecified whether with hypoxia or hypercapnia: Secondary | ICD-10-CM

## 2011-12-04 DIAGNOSIS — I5033 Acute on chronic diastolic (congestive) heart failure: Secondary | ICD-10-CM | POA: Diagnosis present

## 2011-12-04 DIAGNOSIS — J8410 Pulmonary fibrosis, unspecified: Secondary | ICD-10-CM

## 2011-12-04 LAB — BASIC METABOLIC PANEL
CO2: 33 mEq/L — ABNORMAL HIGH (ref 19–32)
Calcium: 8.7 mg/dL (ref 8.4–10.5)
Chloride: 101 mEq/L (ref 96–112)
Glucose, Bld: 73 mg/dL (ref 70–99)
Sodium: 137 mEq/L (ref 135–145)

## 2011-12-04 MED ORDER — PREDNISONE 10 MG PO TABS: ORAL_TABLET | ORAL | Status: DC

## 2011-12-04 MED ORDER — ASPIRIN 81 MG PO CHEW: 81.0000 mg | CHEWABLE_TABLET | Freq: Every day | ORAL | Status: AC

## 2011-12-04 NOTE — Progress Notes (Signed)
Per RN, Pt will need to be transported via ambulance, as she will require O2.  Arranged for transportation.  Pt to be d/c'd.  Providence Crosby, LCSWA Clinical Social Work [phone]

## 2011-12-04 NOTE — Progress Notes (Signed)
Pt to be d/c'd to [facility] later today. Need d/c summary to send to facility. Pt should go by car with daughter. CSW to confirm this plan with RN and NP.  CSW to assist with all paperwork.  Vennie Homans, [place] 12/04/2011 8:44 AM [phone]

## 2011-12-04 NOTE — Discharge Summary (Signed)
Physician Discharge Summary  Patient ID: Teresa Callahan MRN: [id number] DOB/AGE: [birth year] 78 y.o.  Admit date: 11/24/2011 Discharge date: 12/04/2011  Problem List Principal Problem:  *Acute respiratory failure Active Problems:  Dehydration  Pulmonary infiltrates  Hyponatremia  Acute renal failure  Lactic acidosis  Leukocytosis  Chronic UTI  Demand ischemia  Shock circulatory  Diastolic CHF, acute on chronic HPI: This is a highly functional 78 YOWF, who is followed by Urology for chronic UTI. Reports last seen by Dr Sherron Monday on 2/26 for follow up re: urinary Incontinence. At this time she was placed on nitrofurantoin for possible UTI. She reported spending the greater part of the days on 2/26 thru 3/2 in bed due to nausea, weakness and inability to take in fluids, as well as light headedness when she would try to stand. Per her family she rallied briefly on the am of 3/3, then that afternoon began to develop dry non-productive cough. This was associated with progressive Shortness of breath and temp of 100 the evening of 3/3. She Presented to the ER on 3/4 with resting dyspnea, O2 sats leukocytosis, lactic acid of 6.5 and CXR with diffuse pulm infiltrates. She was placed on oxygen, given IVFs and empiric antibiotics. PCCM was asked to eval.      Hospital Course: Lines / Drains:  Right IJ CVL 3/4>>>3/12  Cultures:  PCT 3/4: 3.28  Lactate 3/4: 6.5>>>4.1 (after fluid resuscitation in ER)  BC X2 3/4>>>neg  Urine strep 3/4>>>neg  Urine legionella 3/4>>>neg  Influenza PCR 3/4>>>neg  3/5 PCT : 2.08  3/5 Urine > neg  Antibiotics:  levaquin 3/4 (Cover CAP and UTI)>>>3/6  Rocephin 3/4 (CAP)>>> 3/12  azithro 3/6>>>3/12  Tests / Events  Cortisol 3/4: 24.9  ESR: 3/4: 65  EGDT 3/4>>> 3/5  Echo 3/5>>>normal LV systolic fx, EF 55 to 60%, grade 2 diastolic dysfx   Vital Signs:  BP 135/52  Pulse 72  Temp(Src) 97.4 F (36.3 C) (Oral)  Resp 18  Ht 5\' 2"  (1.575 m)  Wt 113 lb 12.1 oz  (51.6 kg)  BMI 20.81 kg/m2  SpO2 91% @ 2lpm    Intake/Output Summary (Last 24 hours) at 12/03/11 1227 Last data filed at 12/03/11 0900   Gross per 24 hour   Intake  480 ml   Output  1250 ml   Net  -770 ml     Assessment and Plan:  Acute respiratory failure in the setting bilateral infiltrates: working dx is pneumonitis (either post-viral or nitrofurantoin induced) or ALI. Now probably complicated by iatrogenic volume overload. Slow improvement in Oxygen requirements.CXR better with diuresis  Plan:  -wean FIO2  -scheduled Nebs  -off all ABX  -continue steroid trial, taper prednisone down by 10mg  Every 5 days until off  -d/c lasix  Elevated Cardiac enzymes/demand ischemia: prelim ECHO w/ good LV fxn, and gd 2 diastolic dysfunction  Plan:  -asa daily  Hypothyroidism  Lab Results   Component  Value  Date    TSH  2.513  11/24/2011   Plan  -continue synthroid  Protein-calorie Malnutrition  Plan  -diet advanced  Disposition:  Pt not a CIR candidate, plan will be for SNF placement at [facility] practices / Disposition:  -->floor status under PCCM  -->Limited code. Short term intubation, pressors no CPR, defib or pacemaker.  -->diet: low salt     Labs at discharge Lab Results  Component Value Date   CREATININE 0.56 12/04/2011   BUN 18 12/04/2011   NA 137 12/04/2011  K 3.6 12/04/2011   CL 101 12/04/2011   CO2 33* 12/04/2011   Lab Results  Component Value Date   WBC 9.5 12/03/2011   HGB 10.2* 12/03/2011   HCT 30.3* 12/03/2011   MCV 90.7 12/03/2011   PLT 619* 12/03/2011   Lab Results  Component Value Date   ALT 51* 11/26/2011   AST 42* 11/26/2011   ALKPHOS 152* 11/26/2011   BILITOT 0.4 11/26/2011   No results found for this basename: INR, PROTIME    Current radiology studies Dg Chest Port 1 View  12/03/2011  *RADIOLOGY REPORT*  Clinical Data: Follow up infiltrates  PORTABLE CHEST - 1 VIEW  Comparison: 12/02/2011  Findings: Cardiomediastinal silhouette is stable.   Stable central interstitial prominence bilateral probable edema or pneumonitis superimposed on chronic interstitial lung disease.  Small left pleural effusion with left basilar atelectasis or infiltrate. Degenerative changes bilateral shoulders again noted right greater than left.  IMPRESSION: Stable central interstitial prominence bilateral probable edema or pneumonitis superimposed on chronic interstitial lung disease. Small left pleural effusion with left basilar atelectasis or infiltrate.  Original Report Authenticated By: Natasha Mead, M.D.    Disposition:  Final discharge disposition not confirmed  Discharge Orders    Future Orders Please Complete By Expires   Diet - low sodium heart healthy      Increase activity slowly        Medication List  As of 12/04/2011 10:53 AM   STOP taking these medications         nitrofurantoin (macrocrystal-monohydrate) 100 MG capsule      phenazopyridine 100 MG tablet         TAKE these medications         albuterol 108 (90 BASE) MCG/ACT inhaler   Commonly known as: PROVENTIL HFA;VENTOLIN HFA   Inhale 2 puffs into the lungs every 6 (six) hours as needed. For shortness of breath      ALIGN 4 MG Caps   Take 1 capsule by mouth daily.      aspirin 81 MG chewable tablet   Chew 1 tablet (81 mg total) by mouth daily.      beclomethasone 80 MCG/ACT inhaler   Commonly known as: QVAR   Inhale 1 puff into the lungs as needed.      calcium citrate 950 MG tablet   Commonly known as: CALCITRATE - dosed in mg elemental calcium   Take 1 tablet by mouth daily.      lansoprazole 15 MG capsule   Commonly known as: PREVACID   Take 15 mg by mouth daily.      levothyroxine 75 MCG tablet   Commonly known as: SYNTHROID, LEVOTHROID   Take 75 mcg by mouth daily.      mulitivitamin with minerals Tabs   Take 1 tablet by mouth daily.      naproxen sodium 220 MG tablet   Commonly known as: ANAPROX   Take 220 mg by mouth 2 (two) times daily with a meal.       predniSONE 10 MG tablet   Commonly known as: DELTASONE   Take 4 tabs  daily with food x 4 days, then 3 tabs daily x 4 days, then 2 tabs daily x 4 days, then 1 tab daily x4 days then stop. #40      solifenacin 5 MG tablet   Commonly known as: VESICARE   Take 10 mg by mouth daily.           Follow-up Information  Follow up with Kari Baars, MD. Call on 12/04/2011. (make appoinment when discharged from [facility].)    Contact information:   [id number] [facility], [place]. [place] [address] [phone]          Discharged Condition: good  Signed: Brett Canales Minor ACNP Adolph Pollack PCCM Pager [phone] till 3 pm If no answer page [phone] 12/04/2011, 10:53 AM  I have seen and examined this patient with the nurse practionner and agree with the above assessment and plan.  I have made notations to the exceptions.  Shan Levans Beeper  [phone]  Cell  [phone]  If no response or cell goes to voicemail, call beeper [phone]

## 2011-12-04 NOTE — Plan of Care (Signed)
Problem: Phase I Progression Outcomes Goal: EF % per last Echo/documented,Core Reminder form on chart Outcome: Not Applicable Date Met:  12/04/11 EF 55%-60%

## 2011-12-04 NOTE — Plan of Care (Signed)
Problem: Discharge Progression Outcomes Goal: If EF < 40% ACEI/ARB addressed at discharge Outcome: Not Applicable Date Met:  12/04/11 EF 55%-60%

## 2011-12-04 NOTE — Progress Notes (Signed)
Patient discharge to [facility], picked up by [facility] on O2 at 2L/min. Daughter Efraim Kaufmann made aware of transport. PIV removed no s/s/ of infiltration no swelling noted.

## 2011-12-04 NOTE — Progress Notes (Signed)
Pt has been accepted to [facility]  See D/C note to follow AS to the additional documentation requested.  Note this pts elevated cardiac enzymes and BNP were felt to be d/t  Acute on chronic diastolic CHF and no MI.  Shan Levans Beeper  [phone]  Cell  [phone]  If no response or cell goes to voicemail, call beeper [phone]

## 2012-01-12 ENCOUNTER — Ambulatory Visit (INDEPENDENT_AMBULATORY_CARE_PROVIDER_SITE_OTHER): Payer: Medicare Other | Admitting: Pulmonary Disease

## 2012-01-12 ENCOUNTER — Ambulatory Visit (INDEPENDENT_AMBULATORY_CARE_PROVIDER_SITE_OTHER)
Admission: RE | Admit: 2012-01-12 | Discharge: 2012-01-12 | Disposition: A | Discharge status: Home / Self Care | Payer: Medicare Other | Source: Ambulatory Visit | Attending: Pulmonary Disease | Admitting: Pulmonary Disease

## 2012-01-12 ENCOUNTER — Encounter: Payer: Self-pay | Admitting: Pulmonary Disease

## 2012-01-12 VITALS — BP 122/64 | HR 76 | Temp 97.8°F | Ht 62.0 in | Wt 115.6 lb

## 2012-01-12 DIAGNOSIS — R918 Other nonspecific abnormal finding of lung field: Secondary | ICD-10-CM

## 2012-01-12 NOTE — Progress Notes (Signed)
  Subjective:    Patient ID: Teresa Callahan, female    DOB: 1917/09/28, 78 y.o.   MRN: [id number]  HPI The patient comes in today for followup after a recent hospitalization with pulmonary infiltrates and acute respiratory failure.  These were felt secondary to either nitrofurantoin toxicity, Boop, viral or bacterial infection, or possibly pulmonary edema.  She was treated with steroids, antibiotics, and diuresis with significant improvement.  She was discharged home on a prednisone taper which she has finished, as well as oxygen.  The patient has currently stopped using her oxygen.  She feels that she is greatly improved, and currently rates her breathing as 7/10, with her baseline being 10.  Her oxygen saturations today are excellent.  She has no significant cough or mucus production.  She does have hoarseness that is chronic according to the daughter.  She has not had a followup chest x-ray since her discharge.   Review of Systems  Constitutional: Positive for appetite change. Negative for fever and unexpected weight change.  HENT: Negative for ear pain, nosebleeds, congestion, sore throat, rhinorrhea, sneezing, trouble swallowing, dental problem, postnasal drip and sinus pressure.   Eyes: Negative for redness and itching.  Respiratory: Positive for shortness of breath. Negative for cough, chest tightness and wheezing.   Cardiovascular: Negative for palpitations and leg swelling.  Gastrointestinal: Negative for nausea and vomiting.  Genitourinary: Negative for dysuria.  Musculoskeletal: Positive for joint swelling.  Skin: Negative for rash.  Neurological: Negative for headaches.  Hematological: Does not bruise/bleed easily.  Psychiatric/Behavioral: Negative for dysphoric mood. The patient is not nervous/anxious.        Objective:   Physical Exam Frail female in no acute distress Nose without purulence or discharge noted Chest with a few scattered crackles, but excellent air flow and no  wheezing Cardiac exam with a regular rate and rhythm Lower extremities without edema, no cyanosis Alert and oriented, moves all 4 extremities.       Assessment & Plan:

## 2012-01-12 NOTE — Patient Instructions (Signed)
Will discontinue your oxygen Will check a cxr today, and call you with results. followup with me in 6mos, but call if you are having breathing issues.

## 2012-01-12 NOTE — Assessment & Plan Note (Signed)
The patient recently was hospitalized with acute respiratory failure and bilateral pulmonary infiltrates of unknown origin.  It was unclear whether this was infectious, inflammatory related to nitrofurantoin or boop, and how much was related to edema.  The patient is obviously much improved, with excellent oxygen saturations.  She feels that her breathing is almost back to her usual baseline.  At this point, we'll discontinue her oxygen, and will also do a followup chest x-ray.  I suspect the patient does not need ongoing pulmonary followup, but the daughter would like [place] to see her at least one more time.  Will arrange a followup in 6 months, but the patient is to call if she has any change in her clinical pulmonary status.

## 2012-04-28 ENCOUNTER — Encounter: Payer: Self-pay | Admitting: Vascular Surgery

## 2012-05-13 ENCOUNTER — Encounter (HOSPITAL_COMMUNITY): Payer: Self-pay | Admitting: Emergency Medicine

## 2012-05-13 ENCOUNTER — Emergency Department (HOSPITAL_COMMUNITY)
Admission: EM | Admit: 2012-05-13 | Discharge: 2012-05-13 | Disposition: A | Discharge status: Home / Self Care | Payer: Medicare Other | Source: Home / Self Care | Attending: Emergency Medicine | Admitting: Emergency Medicine

## 2012-05-13 DIAGNOSIS — T148XXA Other injury of unspecified body region, initial encounter: Secondary | ICD-10-CM

## 2012-05-13 DIAGNOSIS — IMO0002 Reserved for concepts with insufficient information to code with codable children: Secondary | ICD-10-CM

## 2012-05-13 NOTE — ED Provider Notes (Signed)
Chief Complaint  Patient presents with  . Fall    History of Present Illness:  Teresa Callahan is a 78 year old female who sustained a laceration to her left forearm last night. She was in her closet at home and stepped on a shoe, losing her balance. She tried to catch herself and and struck her left forearm on a clothes rack, sustaining a skin tear to the forearm. Bleeding is under control. She able to move the elbow and wrist without any difficulty. It's not very painful and she's concerned about the laceration. She recalls that her last tetanus vaccine was about 2 years ago. She denies any numbness, tingling, or weakness of the arm. She did not hit her head and there was no loss of consciousness.  Review of Systems:  Other than noted above, the patient denies any of the following symptoms: Systemic:  No fever or chills. Musculoskeletal:  No joint pain or decreased range of motion. Neuro:  No numbness, tingling, or weakness.  PMFSH:  Past medical history, family history, social history, meds, and allergies were reviewed.  Physical Exam:   Vital signs:  BP 148/71  Pulse 74  Temp 98.5 F (36.9 C) (Oral)  Resp 20  SpO2 98% Ext:  There was a 2 cm V-shaped skin tear to the left forearm. This was very superficial. There was no pain to palpation.  All joints had a full ROM without pain.  Pulses were full.  Good capillary refill in all digits.  No edema. Neurological:  Alert and oriented.  No muscle weakness.  Sensation was intact to light touch.     Procedure: Verbal informed consent was obtained.  The patient was informed of the risks and benefits of the procedure and understands and accepts.  Identity of the patient was verified verbally and by wristband.   The laceration area described above was prepped with saline. The wound was then closed as follows:  Benzoin tincture was applied around the wound. The wound edges were pulled together with Steri-Strips.  There were no immediate complications,  and the patient tolerated the procedure well. The laceration was then cleansed, and a clean, dry pressure dressing was put on.   Assessment:  The encounter diagnosis was Skin tear.  Plan:   1.  The following meds were prescribed:   New Prescriptions   No medications on file   2.  The patient was instructed in wound care and pain control, and handouts were given. 3.  The patient was told to return if any sign of infection.     Reuben Likes, MD 05/13/12 1318

## 2012-05-13 NOTE — ED Notes (Signed)
Fall last night.  Hanging clothes in closet, possibly tripped on a shoe and fell.  Abrasion/bruising to left forearm.  Able to move extremities

## 2012-05-13 NOTE — ED Notes (Signed)
Remembers having tetanus within last year

## 2012-07-13 ENCOUNTER — Ambulatory Visit (INDEPENDENT_AMBULATORY_CARE_PROVIDER_SITE_OTHER): Payer: Medicare Other | Admitting: Pulmonary Disease

## 2012-07-13 ENCOUNTER — Encounter: Payer: Self-pay | Admitting: Pulmonary Disease

## 2012-07-13 ENCOUNTER — Ambulatory Visit (INDEPENDENT_AMBULATORY_CARE_PROVIDER_SITE_OTHER)
Admission: RE | Admit: 2012-07-13 | Discharge: 2012-07-13 | Disposition: A | Discharge status: Home / Self Care | Payer: Medicare Other | Source: Ambulatory Visit | Attending: Pulmonary Disease | Admitting: Pulmonary Disease

## 2012-07-13 VITALS — BP 108/60 | HR 75 | Ht 62.0 in | Wt 117.4 lb

## 2012-07-13 DIAGNOSIS — R918 Other nonspecific abnormal finding of lung field: Secondary | ICD-10-CM

## 2012-07-13 NOTE — Addendum Note (Signed)
Addended by: Ozella Almond R on: 07/13/2012 11:21 AM   Modules accepted: Orders

## 2012-07-13 NOTE — Assessment & Plan Note (Signed)
The patient has done extremely well since her last visit, with no worsening shortness of breath or cough.  Her chest x-ray 6 months ago showed almost complete resolution of her infiltrates, and we are assuming this was a Macrodantin reaction or possibly due to BOOP.  The patient responded well to a course of prednisone, and has not had recurrence of symptoms.  Will call her with the results of her followup chest x-ray, and she does not require further pulmonary followup if the x-ray looks adequate.

## 2012-07-13 NOTE — Patient Instructions (Addendum)
Make sure you never take macrodantin or nitrofurantoin again for a urinary tract infection Will check one more cxr today to make sure no further problems.  Will call you with the results. followup with me as needed.

## 2012-07-13 NOTE — Progress Notes (Signed)
  Subjective:    Patient ID: Teresa Callahan, female    DOB: 03-05-1918, 78 y.o.   MRN: [id number]  HPI The patient comes in today for followup of her history of pulmonary infiltrates.  These were felt secondary to Macrodantin reaction, or possibly BOOP.  She responded dramatically to a short course of prednisone, and her followup x-ray 6 months ago showed almost complete resolution of her infiltrates.  She comes in today where she feels she is back to her usual baseline.  She denies any significant dyspnea on exertion, nor has she had cough and mucus.   Review of Systems  Constitutional: Negative for fever and unexpected weight change.  HENT: Negative for ear pain, nosebleeds, congestion, sore throat, rhinorrhea, sneezing, trouble swallowing, dental problem, postnasal drip and sinus pressure.   Eyes: Negative for redness and itching.  Respiratory: Negative for cough, chest tightness, shortness of breath and wheezing.   Cardiovascular: Negative for palpitations and leg swelling.  Gastrointestinal: Negative for nausea and vomiting.  Genitourinary: Negative for dysuria.  Musculoskeletal: Positive for joint swelling.  Skin: Negative for rash.  Neurological: Negative for headaches.  Hematological: Bruises/bleeds easily.  Psychiatric/Behavioral: Negative for dysphoric mood. The patient is not nervous/anxious.        Objective:   Physical Exam Thin female in no acute distress Nose without purulence or discharge noted Neck without lymphadenopathy or thyromegaly Chest totally clear to auscultation, no wheezing Cardiac exam with regular rate and rhythm Lower extremities without edema, no cyanosis Alert and oriented, moves all 4 extremities.       Assessment & Plan:

## 2012-09-22 DIAGNOSIS — Z9289 Personal history of other medical treatment: Secondary | ICD-10-CM

## 2012-09-22 HISTORY — DX: Personal history of other medical treatment: Z92.89

## 2013-02-09 ENCOUNTER — Encounter: Payer: Self-pay | Admitting: *Deleted

## 2013-02-09 ENCOUNTER — Ambulatory Visit: Payer: Self-pay | Admitting: Obstetrics and Gynecology

## 2013-02-17 ENCOUNTER — Encounter (HOSPITAL_COMMUNITY): Payer: Self-pay

## 2013-02-17 ENCOUNTER — Inpatient Hospital Stay (HOSPITAL_COMMUNITY)
Admission: AD | Admit: 2013-02-17 | Discharge: 2013-02-19 | DRG: 812 | Disposition: A | Discharge status: Home Health | Payer: Medicare Other | Source: Ambulatory Visit | Attending: Internal Medicine | Admitting: Internal Medicine

## 2013-02-17 DIAGNOSIS — R195 Other fecal abnormalities: Secondary | ICD-10-CM | POA: Diagnosis present

## 2013-02-17 DIAGNOSIS — Z800 Family history of malignant neoplasm of digestive organs: Secondary | ICD-10-CM

## 2013-02-17 DIAGNOSIS — D509 Iron deficiency anemia, unspecified: Principal | ICD-10-CM | POA: Diagnosis present

## 2013-02-17 DIAGNOSIS — J45909 Unspecified asthma, uncomplicated: Secondary | ICD-10-CM | POA: Diagnosis present

## 2013-02-17 DIAGNOSIS — R627 Adult failure to thrive: Secondary | ICD-10-CM | POA: Diagnosis present

## 2013-02-17 DIAGNOSIS — K5520 Angiodysplasia of colon without hemorrhage: Secondary | ICD-10-CM | POA: Diagnosis present

## 2013-02-17 DIAGNOSIS — K5730 Diverticulosis of large intestine without perforation or abscess without bleeding: Secondary | ICD-10-CM | POA: Diagnosis present

## 2013-02-17 DIAGNOSIS — Z79899 Other long term (current) drug therapy: Secondary | ICD-10-CM

## 2013-02-17 DIAGNOSIS — R42 Dizziness and giddiness: Secondary | ICD-10-CM | POA: Diagnosis present

## 2013-02-17 LAB — IRON AND TIBC: Iron: 11 ug/dL — ABNORMAL LOW (ref 42–135)

## 2013-02-17 LAB — CBC
HCT: 18.7 % — ABNORMAL LOW (ref 36.0–46.0)
MCHC: 27.8 g/dL — ABNORMAL LOW (ref 30.0–36.0)
Platelets: 428 10*3/uL — ABNORMAL HIGH (ref 150–400)
RDW: 19.5 % — ABNORMAL HIGH (ref 11.5–15.5)
WBC: 7.7 10*3/uL (ref 4.0–10.5)

## 2013-02-17 LAB — FERRITIN: Ferritin: 3 ng/mL — ABNORMAL LOW (ref 10–291)

## 2013-02-17 LAB — VITAMIN B12: Vitamin B-12: 470 pg/mL (ref 211–911)

## 2013-02-17 LAB — RETICULOCYTES
Retic Count, Absolute: 82.9 10*3/uL (ref 19.0–186.0)
Retic Ct Pct: 2.8 % (ref 0.4–3.1)

## 2013-02-17 MED ORDER — CALCIUM CITRATE 950 (200 CA) MG PO TABS: 1.0000 | ORAL_TABLET | Freq: Every day | ORAL | Status: DC

## 2013-02-17 MED ORDER — ACETAMINOPHEN 650 MG RE SUPP: 650.0000 mg | Freq: Four times a day (QID) | RECTAL | Status: DC | PRN

## 2013-02-17 MED ORDER — ONDANSETRON HCL 4 MG PO TABS: 4.0000 mg | ORAL_TABLET | Freq: Four times a day (QID) | ORAL | Status: DC | PRN

## 2013-02-17 MED ORDER — DARIFENACIN HYDROBROMIDE ER 7.5 MG PO TB24
7.5000 mg | ORAL_TABLET | Freq: Every day | ORAL | Status: DC
  Administered 2013-02-18: 7.5 mg via ORAL
  Filled 2013-02-17 (×2): qty 1

## 2013-02-17 MED ORDER — ACETAMINOPHEN 325 MG PO TABS: 650.0000 mg | ORAL_TABLET | Freq: Four times a day (QID) | ORAL | Status: DC | PRN

## 2013-02-17 MED ORDER — PANTOPRAZOLE SODIUM 40 MG PO TBEC
40.0000 mg | DELAYED_RELEASE_TABLET | Freq: Every day | ORAL | Status: DC
  Administered 2013-02-17 – 2013-02-18 (×2): 40 mg via ORAL
  Filled 2013-02-17 (×3): qty 1

## 2013-02-17 MED ORDER — SODIUM CHLORIDE 0.9 % IV SOLN
INTRAVENOUS | Status: DC
  Administered 2013-02-17: 50 mL/h via INTRAVENOUS
  Administered 2013-02-18: 06:00:00 via INTRAVENOUS

## 2013-02-17 MED ORDER — SODIUM CHLORIDE 0.9 % IV SOLN
1020.0000 mg | Freq: Once | INTRAVENOUS | Status: AC
  Administered 2013-02-18: 1020 mg via INTRAVENOUS
  Filled 2013-02-17: qty 34

## 2013-02-17 MED ORDER — ALBUTEROL SULFATE HFA 108 (90 BASE) MCG/ACT IN AERS
2.0000 | INHALATION_SPRAY | RESPIRATORY_TRACT | Status: DC | PRN
  Filled 2013-02-17: qty 6.7

## 2013-02-17 MED ORDER — CALCIUM CITRATE 950 (200 CA) MG PO TABS
200.0000 mg | ORAL_TABLET | Freq: Every day | ORAL | Status: DC
  Administered 2013-02-18: 200 mg via ORAL
  Filled 2013-02-17 (×3): qty 1

## 2013-02-17 MED ORDER — ADULT MULTIVITAMIN W/MINERALS CH
1.0000 | ORAL_TABLET | Freq: Every day | ORAL | Status: DC
  Administered 2013-02-17 – 2013-02-18 (×2): 1 via ORAL
  Filled 2013-02-17 (×3): qty 1

## 2013-02-17 MED ORDER — ACETAMINOPHEN 325 MG PO TABS
650.0000 mg | ORAL_TABLET | Freq: Once | ORAL | Status: AC
  Administered 2013-02-17: 650 mg via ORAL
  Filled 2013-02-17: qty 2

## 2013-02-17 MED ORDER — ONDANSETRON HCL 4 MG/2ML IJ SOLN: 4.0000 mg | Freq: Four times a day (QID) | INTRAMUSCULAR | Status: DC | PRN

## 2013-02-17 MED ORDER — POLYSACCHARIDE IRON COMPLEX 150 MG PO CAPS
150.0000 mg | ORAL_CAPSULE | Freq: Every day | ORAL | Status: DC
  Administered 2013-02-18: 150 mg via ORAL
  Filled 2013-02-17 (×2): qty 1

## 2013-02-17 MED ORDER — LEVOTHYROXINE SODIUM 75 MCG PO TABS
75.0000 ug | ORAL_TABLET | Freq: Every day | ORAL | Status: DC
  Administered 2013-02-18 – 2013-02-19 (×2): 75 ug via ORAL
  Filled 2013-02-17 (×3): qty 1

## 2013-02-17 MED ORDER — DIPHENHYDRAMINE HCL 25 MG PO CAPS
25.0000 mg | ORAL_CAPSULE | Freq: Once | ORAL | Status: AC
  Administered 2013-02-17: 25 mg via ORAL
  Filled 2013-02-17: qty 1

## 2013-02-17 MED ORDER — POLYETHYLENE GLYCOL 3350 17 G PO PACK
17.0000 g | PACK | Freq: Every day | ORAL | Status: DC | PRN
  Filled 2013-02-17: qty 1

## 2013-02-17 NOTE — Consult Note (Signed)
Referring Provider: Dr Clelia Croft Primary Care Physician:  Kari Baars, MD Primary Gastroenterologist:  Dr.John Marina Goodell  Reason for Consultation:  Severe microcytic anemia  HPI: Teresa Callahan is a 79 y.o. female  Known to Dr. Yancey Flemings from office evaluation in 2011 at which time she was seen for iron deficiency anemia.patient at that time was noted to have an iron deficiency anemia and Hemoccult-positive stool. She had been started on oral iron and at that time her hemoglobin normalized. She had no GI symptoms and opted not to have any endoscopic intervention at that time. She is known to have history of cecal AVMs which had been seen on colonoscopy done per Dr. Sherin Quarry in November 2006. She had several AVMs which were small and non bleeding. She also has left-sided diverticulosis. Patient had had prior exam in 2003 which was also negative for any evidence of neoplasia. Patient does have a family history of colon cancer in her mother diagnosed in her 22s. Patient had seen Dr. Clelia Croft  in his office today for regular followup and had complained of significant fatigue over the past several week now onset of dizziness over the past couple of days. She's also been having some pain across her shoulder blades. Labs were done in the office she was noted to have a hemoglobin of 5.2 with an MCV of 62. Patient denies any abdominal pain changes in her bowel habits dysphagia or odynophagia and has no regular reflux symptoms. She states that she does occasionally see small amounts of bright red blood on the tissue generally with straining or constipation. Patient has been recommended to stay on chronic iron supplements in the past but had not done this because she says they had caused nausea.Her daughter is concerned because her appetite has generally been poor for some time and she doesn't eat much. He is not had any significant weight loss. She is not on any regular aspirin or anticoagulants.   Past Medical History   Diagnosis Date  . UTI (lower urinary tract infection)   . Asthma   . Thyroid disease   . Diverticulosis   . Anemia   . Carotid stenosis   . Vertigo   . GERD (gastroesophageal reflux disease)   . Postmenopausal   . Atrophic vaginitis   . Osteoporosis   . Incontinence     Past Surgical History  Procedure Laterality Date  . Abdominal hysterectomy  1957  . Cataract extraction      bilat.  . Colonoscopy  09/30/2001  . Left eye surgery      d/t complications from cataract surgery  . Tonsillectomy      Prior to Admission medications   Medication Sig Start Date End Date Taking? Authorizing Provider  albuterol (PROVENTIL HFA;VENTOLIN HFA) 108 (90 BASE) MCG/ACT inhaler Inhale 2 puffs into the lungs every 6 (six) hours as needed for shortness of breath.   Yes Historical Provider, MD  beclomethasone (QVAR) 80 MCG/ACT inhaler Inhale 1 puff into the lungs 2 (two) times daily.   Yes Historical Provider, MD  calcium citrate (CALCITRATE - DOSED IN MG ELEMENTAL CALCIUM) 950 MG tablet Take 1 tablet by mouth daily.   Yes Historical Provider, MD  lansoprazole (PREVACID) 15 MG capsule Take 15 mg by mouth daily.   Yes Historical Provider, MD  levothyroxine (SYNTHROID, LEVOTHROID) 75 MCG tablet Take 75 mcg by mouth daily.   Yes Historical Provider, MD  Multiple Vitamin (MULITIVITAMIN WITH MINERALS) TABS Take 1 tablet by mouth daily.   Yes  Historical Provider, MD  naproxen sodium (ANAPROX) 220 MG tablet Take 220 mg by mouth 2 (two) times daily as needed (for pain).   Yes Historical Provider, MD  polyethylene glycol (MIRALAX / GLYCOLAX) packet Take 17 g by mouth daily as needed (for constipation).   Yes Historical Provider, MD  Probiotic Product (ALIGN) 4 MG CAPS Take 1 capsule by mouth daily.   Yes Historical Provider, MD  solifenacin (VESICARE) 10 MG tablet Take 10 mg by mouth daily.   Yes Historical Provider, MD    Current Facility-Administered Medications  Medication Dose Route Frequency Provider  Last Rate Last Dose  . 0.9 %  sodium chloride infusion   Intravenous Continuous W Buren Kos, MD      . acetaminophen (TYLENOL) tablet 650 mg  650 mg Oral Q6H PRN Kari Baars, MD       Or  . acetaminophen (TYLENOL) suppository 650 mg  650 mg Rectal Q6H PRN Kari Baars, MD      . acetaminophen (TYLENOL) tablet 650 mg  650 mg Oral Once W Buren Kos, MD      . albuterol (PROVENTIL HFA;VENTOLIN HFA) 108 (90 BASE) MCG/ACT inhaler 2 puff  2 puff Inhalation Q4H PRN Kari Baars, MD      . Melene Muller ON 02/18/2013] calcium citrate (CALCITRATE - dosed in mg elemental calcium) tablet 200 mg of elemental calcium  200 mg of elemental calcium Oral Daily W Buren Kos, MD      . Melene Muller ON 02/18/2013] darifenacin (ENABLEX) 24 hr tablet 7.5 mg  7.5 mg Oral Daily W Buren Kos, MD      . diphenhydrAMINE (BENADRYL) capsule 25 mg  25 mg Oral Once W Buren Kos, MD      . Melene Muller ON 02/18/2013] levothyroxine (SYNTHROID, LEVOTHROID) tablet 75 mcg  75 mcg Oral QAC breakfast W Buren Kos, MD      . multivitamin with minerals tablet 1 tablet  1 tablet Oral Daily W Buren Kos, MD      . ondansetron [facility]) tablet 4 mg  4 mg Oral Q6H PRN Kari Baars, MD       Or  . ondansetron [facility]) injection 4 mg  4 mg Intravenous Q6H PRN Kari Baars, MD      . pantoprazole (PROTONIX) EC tablet 40 mg  40 mg Oral Daily W Buren Kos, MD      . polyethylene glycol [facility] Ethelene Hal) packet 17 g  17 g Oral Daily PRN Kari Baars, MD        Allergies as of 02/17/2013 - Review Complete 02/17/2013  Allergen Reaction Noted  . Horse-derived products Other (See Comments)   . Macrodantin  12/04/2011  . Premarin (estrogens, conjugated)  05/13/2012  . Sulfa antibiotics Other (See Comments) 11/24/2011    Family History  Problem Relation Age of Onset  . Colon cancer Mother   . Melanoma Father   . Lymphoma Son     History   Social History  . Marital Status: Widowed    Spouse Name: N/A    Number of Children:  N/A  . Years of Education: N/A   Occupational History  . retired IT consultant    Social History Main Topics  . Smoking status: Former Smoker -- 0.20 packs/day for 8 years    Types: Cigarettes    Quit date: 09/22/1948  . Smokeless tobacco: Never Used  . Alcohol Use: No  . Drug Use: No  . Sexually Active: No  Other Topics Concern  . Not on file   Social History Narrative  . No narrative on file    Review of Systems: Pertinent positive and negative review of systems were noted in the above HPI section.  All other review of systems was otherwise negative.Marland Kitchen  Physical Exam: Vital signs in last 24 hours: Temp:  [97.7 F (36.5 C)] 97.7 F (36.5 C) (05/29 1310) Pulse Rate:  [73] 73 (05/29 1310) Resp:  [18] 18 (05/29 1310) BP: (114)/(44) 114/44 mmHg (05/29 1310) SpO2:  [95 %] 95 % (05/29 1310) Weight:  [112 lb 9.6 oz (51.075 kg)] 112 lb 9.6 oz (51.075 kg) (05/29 1310)   General:   Alert,  Well-developed, well-nourished,Frail small elderly WF in NAD pleasant and cooperative in NAD Head:  Normocephalic and atraumatic. Eyes:  Sclera clear, no icterus.   Conjunctiva pale. Ears:  Normal auditory acuity. Nose:  No deformity, discharge,  or lesions. Mouth:  No deformity or lesions.   Neck:  Supple; no masses or thyromegaly. Lungs:  Clear throughout to auscultation.   No wheezes, crackles, or rhonchi. Heart:  Regular rate and rhythm; no murmurs, clicks, rubs,  or gallops. Abdomen:  Soft,nontender, BS active,nonpalp mass or hsm.   Rectal:  Deferred  Msk:  Symmetrical without gross deformities. . Pulses:  Normal pulses noted. Extremities:  Without clubbing or edema. Neurologic:  Alert and  oriented x4;  grossly normal neurologically. Skin:  Intact without significant lesions or rashes.. Psych:  Alert and cooperative. Normal mood and affect.  Intake/Output from previous day:   Intake/Output this shift:         Studies/Results: No results found.  IMPRESSION:  #69  79 year old female with profound microcytic anemia, symptomatic with fatigue and dizziness.Etiology of anemia is not entirely clear though suspect chronic blood loss secondary to intestinal AVMs , cannot rule out occult colon cancer or other intestinal malignancy #2 previous history of iron deficiency anemia-patient has not maintained oral iron therapy #3 history of cecal AVMs-out previously to be the cause of iron deficiency #4 family history of colon cancer #5 hypothyroidism #6 history of chronic UTIs  PLAN: #1 patient is to be transfused 2 units of packed RBCs today and also receiving IV iron infusion. #2 long discussion with patient and her daughter regarding endoscopic evaluation. They are reluctant at this time and would like to proceed with transfusions and iron therapy and then reevaluate in several weeks. If there is evidence of ongoing blood loss then proceed with endoscopic intervention at that time. This is a reasonable approach and patient and family are aware that we cannot rule out occult malignancy at this time. #3 patient has been intolerant to oral iron in the past and would give her a trial of either Nu-Iron or Integra plus which may be easier to tolerate. Will followup in a.m.   Amy Esterwood  02/17/2013, 2:28 PM     ________________________________________________________________________  [facility] MD note:  I personally examined the patient, reviewed the data and agree with the assessment and plan described above.  I spoke at length with pt, her daughter.  We decided on the above plan.  If she tolerates blood transfusion, iron infusion then probably home tomorrow on daily Iron (which she has not been taking for past 2-3 years), labs and follow up appt at our office in 5-6 weeks.    Rob Bunting, MD [facility] Pager [phone]

## 2013-02-17 NOTE — H&P (Signed)
Vital Signs  Entered weight:  111  lbs., 8 oz.  Calculated Weight: 111.50 lbs., ( 50.58 kg) Height: 62.75 in., ( 159.38 cm) Pulse rate: 80 Pulse rhythm: regular  Blood Pressure #1: 110 / 30 mm Hg , laying Pulse: 78  Blood Pressure #2: 112 / 30 mm Hg , sitting Pulse: 76  Blood Pressure #3: 112 / 30 mm Hg , standing Pulse: 78  BMI: 19.91 BSA: 1.50 Wt Chg: 1.50 lbs since 11/19/2012  Vitals entered by: Leeann Must  CNA on Feb 17, 2013 10:15 AM  Other Procedures Completed: EKG- old RBBB, no acute changes    PHQ-2 Depression Screening   1. During the past month, have you often been bothered by feeling down, depressed, or hopeless?  not at all 2. During the past month, have you often been bothered by little interest or pleasure in doing things? not at all SCORE:  0  Falls Assessment  During the past 12 months, have you fallen 2 or more times? No Have you been injured due to a fall? No   Risk Factors:      Has patient --       Felt need to cut down:  no       Been annoyed by complaints:  no       Felt guilty about drinking:  no       Needed eye opener in the morning:  no    Counseled to quit/cut down alcohol use:  no  History of Present Illness  History from: patient Reason for visit: Routine Follow-up Chief Complaint: pt is dizzy today , she also has been having some pain in her back and shoulder area when she walks  History of Present Illness: Miss Mataya is here today for a scheduled follow-up visit.  However, she has not been feeling well for the past few weeks.  Gets dizzy when she stands.  Increased fatigue.  Her daughter feels like she is pale.  She does have a history of iron deficiency anemia but has not had any lab work done in a while.  Prior evaluation showed colonic AVMs several years ago.  She occasionally sees bright red blood in her stool.  Denies any dark tarry stools.  Gets shortness of breath and pain across shoulders when walking.  Resolves when she lies down.  No  chest pain.  Onset after recent asthma attack which resolved.  Remains on Proair.   Asthma is worse in the winter/cold.    Labs were obtained in the office today that showed a hemoglobin of 5.2 with an MCV of 62.  BUN/creatinine are 25 and 0.9 respectively.  Her CMET is otherwise normal.  Ferritin is 1.9.  Other iron test are pending.  Given her profound symptomatic anemia she is being admitted for further management.     Review of Systems  General:       Complains of fatigue, malaise.        Denies weight loss.   Eyes:       Denies vision loss.   Cardiovascular:       Complains of dyspnea on exertion.        Denies chest pains, peripheral edema.   Respiratory:       Denies cough, wheezing.   Gastrointestinal:       Complains of see HPI.   Genitourinary:       Denies urinary frequency.   Musculoskeletal:       Denies joint pain.  Neurologic:       Complains of dizziness.   Psychiatric:       Denies depression, anxiety.   Heme/Lymphatic:       Denies abnormal bruising, bleeding, enlarged lymph nodes.    Past History Past Medical History: Asthma hypothyroidism Osteopenia- declines follow-up DXA carotid stenosis- 100% right, no significant blockage in left (7/11) urge incontinence chronic vertigo Iron deficiency anemia- colonic AVMs (2003, 2006) Surgical History (reviewed - no changes required): s/p TAH BSO s/p cataract repair s/p T&A Family History (reviewed - no changes required): F- melanoma (d75) M- Rectal CA (d78) bro- (d79 sudden) sis- alive in  31s Social History (reviewed - no changes required): Widow. lives alone at [facility]. 5 children (son in [place]). 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 Quit smoking 1969 (1/2 ppd X 15 years). 2 drinks/night. No drug use.   Physical Exam  General appearance: pale appearing, weak  Eyes  External: scleral pallor Pupils: equal, round, reactive to light and  accommodation  Ears, Nose and Throat  External ears: normal, no lesions or deformities External nose: normal, no lesions or deformities Otoscopic: canals clear, tympanic membranes intact, no fluid Hearing: grossly intact Nasal: mucosa, septum, and turbinates normal Dental: good dentition Pharynx: slightly hoarse, swallowing well, mildly injected post pharynx  Neck  Neck: supple, no masses, trachea midline Thyroid: no nodules, masses, tenderness, or enlargement  Respiratory  Respiratory effort: no intercostal retractions or use of accessory muscles Auscultation: no rales, rhonchi, or wheezes  Cardiovascular  Auscultation: Grade 2/6 systolic murmur left upper sternal border Carotid arteries: left carotid bruit Pedal pulses: pulses 2+, symmetric Periph. circulation: no cyanosis, clubbing, edema  Gastrointestinal  Abdomen: soft, non-tender, no masses, bowel sounds normal Liver and spleen: no enlargement or nodularity  Musculoskeletal  Gait and station: dizziness when sitting up Digits and nails: no clubbing, cyanosis, petechiae, or nodes Head and neck: normal alignment and mobility Spine, ribs, pelvis: normal alignment and mobility, no deformity RUE: normal ROM and strength, no joint enlargement or tenderness LUE: normal ROM and strength, no joint enlargement or tenderness RLE: normal ROM and strength, no joint enlargement or tenderness LLE: normal ROM and strength, no joint enlargement or tenderness  Skin  Inspection: pale  Neurologic  Reflexes: 2+, symmetric Sensation: intact to touch, vibration  Mental Status Exam  Judgment, insight: intact Orientation: oriented to time, place, and person Memory: intact Mood and affect: Normal Mood   Impression & Recommendations:  Problem # 1:  ANEMIA, IRON DEFICIENCY (ICD-280.9) (ICD10-D50.9) She has profound iron deficiency anemia with a hemoglobin of 5.2 , with clear symptoms including lightheadedness and diastolic  hypotension.  She will be admitted for transfusion and GI evaluation.  Prior workup consistent with colonic AVMs, which is likely the source given her intermittent bright red blood per rectum and normal BUN. Orders: Venipuncture ([id number]) CBC/Platelets ([id number]) Ferritin ([id number])  B-12 ([id number]) Iron _TIBC (tibc)   Problem # 2:  Orthostatic dizziness (ICD-780.4) (QIO96-E95) Orthostasis secondary to low blood pressure in the setting of her profound anemia.  Electrolytes and renal function are okay. Orders: EKG 12 Leads ([id number]) CMET (SMA 12) (CPT-80053) TSH ([id number])   Problem # 3:  ASTHMA (ICD-493.90) (ICD10-J45.909) Her dyspnea is likely related to her anemia.  She is not able to benefit from Symbicort may want to consider nebulizer. The following medications were removed from the medication list:    Symbicort 80-4.5 Mcg/act Aero (Budesonide-formoterol fumarate) ..... Inhale 2 puffs twice daily as directed  with spacer device  Her updated medication list for this problem includes:    Proair Hfa 108 (90 Base) Mcg/act Aers (Albuterol sulfate) ..... Inhale 2 puffs every 6 hours as needed   Problem # 4:  Failure to thrive Will ask physical therapy/occupational therapy to see her.  Will likely need some additional assistance at home.  Her daughter is looking into in-home aides.  Ask social work/case management to assist with resources.  Medications Added to Medication List This Visit: 1)  Vesicare 5 Mg Tabs (Solifenacin succinate) .... One po every day  Complete Medication List: 1)  Vesicare 5 Mg Tabs (Solifenacin succinate) .... One po every day 2)  Miralax Pack (Polyethylene glycol 3350) .... Use as needed 3)  Qnasl 80 Mcg/act Aers (Beclomethasone diprop (nasal)) .... One puff as needed two times a day as needed for asthma 4)  Cvs Naproxen Sodium 220 Mg Tabs (Naproxen sodium) .... One tab by mouth as needed for pain 5)  Prevacid 15 Mg Cpdr (Lansoprazole) .... Take 1  capsule by mouth every day 6)  Multivitamins Tabs (Multiple vitamin) .... Take one tablet by mouth every day 7)  Calcitrate 950 Mg Tabs (Calcium citrate) .... One po every day 8)  Aleve 220 Mg Tabs (Naproxen sodium) .... Take one by mouth as needed 9)  Align 4 Mg Caps (Probiotic product) .... Take one by mouth as directed 10)  Synthroid 75 Mcg Tabs (Levothyroxine sodium) .Marland Kitchen.. 1 po daily 11)  Proair Hfa 108 (90 Base) Mcg/act Aers (Albuterol sulfate) .... Inhale 2 puffs every 6 hours as needed  Other Orders: ANNUAL DEPRESSION SCREENING  (CPT-G0444)   Comments: patient admitted to the hospital    Electronically signed by Donnie Mesa MD on 02/17/2013 at 1:22 PM  ________________________________________________________________________

## 2013-02-17 NOTE — Progress Notes (Addendum)
MEDICATION RELATED CONSULT NOTE - INITIAL   Pharmacy Consult for IV Iron Indication: Iron Deficiency Anemia  Allergies  Allergen Reactions  . Horse-Derived Products Other (See Comments)    Loss of appetite  . Macrodantin   . Premarin (Estrogens, Conjugated)   . Sulfa Antibiotics Other (See Comments)    Loss of appetite     Patient Measurements: Height: 5\' 1"  (154.9 cm) Weight: 112 lb 9.6 oz (51.075 kg) IBW/kg (Calculated) : 47.8 Adjusted Body Weight:   Vital Signs: Temp: 98.4 F (36.9 C) (05/29 1825) Temp src: Oral (05/29 1825) BP: 125/41 mmHg (05/29 1825) Pulse Rate: 94 (05/29 1825) Intake/Output from previous day:   Intake/Output from this shift: Total I/O In: 600 [I.V.:250; Blood:350] Out: 200 [Urine:200]  Labs:  Recent Labs  02/17/13 1440  WBC 7.7  HGB 5.2*  HCT 18.7*  PLT 428*   Estimated Creatinine Clearance: 33.9 ml/min (by C-G formula based on Cr of 0.56).   Microbiology: No results found for this or any previous visit (from the past 720 hour(s)).  Medical History: Past Medical History  Diagnosis Date  . UTI (lower urinary tract infection)   . Asthma   . Thyroid disease   . Diverticulosis   . Anemia   . Carotid stenosis   . Vertigo   . GERD (gastroesophageal reflux disease)   . Postmenopausal   . Atrophic vaginitis   . Osteoporosis   . Incontinence    Assessment: 67 yoF with severe microcytic anemia seen in office today with labs revealing hgb 5.2, MCV 62.  Pt admitted for further management and pharmacy consulted to dose IV iron. Pt has hx iron deficiency anemia which normalized after taking oral iron, however pt has not been taking supplements due to nausea.  Also hx cecal AVMs, small, non-bleeding, thyroid disease, diverticulosis, GERD, carotid stenosis.  Pt to be transfused 2 units PRBCs and to start on oral iron 5/29 (Nu-Iron or Integra plus for better tolerability).    Iron deficit calculated as 1300mg   Goal of Therapy:  Hgb  >12  Plan:  1.  Feraheme 1020mg  IV x 1 2.  Monitor for hypersensitivity reactions  Haynes Hoehn, PharmD 02/17/2013 7:07 PM  Pager: [phone]

## 2013-02-17 NOTE — Progress Notes (Signed)
PT NOTE  Order received. Chart reviewed. Noted pt's Hgb 5.2. Will hold PT today and check back tomorrow. Thanks. Rebeca Alert, PT [phone]

## 2013-02-18 LAB — PREPARE RBC (CROSSMATCH)

## 2013-02-18 LAB — BASIC METABOLIC PANEL
CO2: 21 mEq/L (ref 19–32)
Calcium: 9.2 mg/dL (ref 8.4–10.5)
Chloride: 107 mEq/L (ref 96–112)
Glucose, Bld: 83 mg/dL (ref 70–99)
Potassium: 4.6 mEq/L (ref 3.5–5.1)
Sodium: 136 mEq/L (ref 135–145)

## 2013-02-18 LAB — CBC
Hemoglobin: 8.5 g/dL — ABNORMAL LOW (ref 12.0–15.0)
MCH: 21.1 pg — ABNORMAL LOW (ref 26.0–34.0)
Platelets: 330 10*3/uL (ref 150–400)
RBC: 4.02 MIL/uL (ref 3.87–5.11)
WBC: 6.1 10*3/uL (ref 4.0–10.5)

## 2013-02-18 MED ORDER — ACETAMINOPHEN 325 MG PO TABS
650.0000 mg | ORAL_TABLET | Freq: Once | ORAL | Status: AC
  Administered 2013-02-18: 650 mg via ORAL
  Filled 2013-02-18: qty 2

## 2013-02-18 MED ORDER — DIPHENHYDRAMINE HCL 25 MG PO CAPS
25.0000 mg | ORAL_CAPSULE | Freq: Once | ORAL | Status: AC
  Administered 2013-02-18: 25 mg via ORAL
  Filled 2013-02-18: qty 1

## 2013-02-18 NOTE — Progress Notes (Signed)
Clinical Social Work Department BRIEF PSYCHOSOCIAL ASSESSMENT 02/18/2013  Patient:  Teresa Callahan, Teresa Callahan     Account Number:  [id number]     Admit date:  02/17/2013  Clinical Social Worker:  Dennison Bulla  Date/Time:  02/18/2013 10:30 AM  Referred by:  Physician  Date Referred:  02/18/2013 Referred for  SNF Placement   Other Referral:   Interview type:  Patient Other interview type:    PSYCHOSOCIAL DATA Living Status:  ALONE Admitted from facility:   Level of care:   Primary support name:  Melissa Primary support relationship to patient:  CHILD, ADULT Degree of support available:   Strong    CURRENT CONCERNS Current Concerns  Post-Acute Placement   Other Concerns:    SOCIAL WORK ASSESSMENT / PLAN CSW received referral to assist with DC planning. CSW reviewed chart and met with patient and dtr at bedside. CSW introduced myself and explained role. Patient agreeable to assessment and to dtr's involvement during session.    Patient currently lives alone and completes most activities independently. Patient has someone clean her house every Saturday and is requesting an aide to help her bathe. Patient and dtr are not interested in a facility at this time and feel patient can manage at home. CSW made CM aware of consult.    CSW is signing off but available if further needs arise.   Assessment/plan status:  No Further Intervention Required Other assessment/ plan:   Information/referral to community resources:   SNF information    PATIENT'S/FAMILY'S RESPONSE TO PLAN OF CARE: Patient alert and oriented. Patient thanked CSW for time but feels that [facility] is more appropriate.

## 2013-02-18 NOTE — Progress Notes (Signed)
Orders received for me to set up [facility] services for pt. I met with her at bedside and explained to her what home health services entail. Pt politely declined this and stated what she needs most is someone to come by her house and help with cleaning up. I therefore provided her with a private duty list and explained to her that she has to set this up herself and services are not covered through her health insurance. She verbalized understanding.  Algernon Huxley RN BSN  [phone]

## 2013-02-18 NOTE — Progress Notes (Signed)
Subjective: Feels better.  Tolerated 2U PRBC transfusion yesterday.  Small BM this am normal in appearance (sample was not sent for hemoccult).    Objective: Vital signs in last 24 hours: Temp:  [97.3 F (36.3 C)-98.6 F (37 C)] 98.1 F (36.7 C) (05/30 0600) Pulse Rate:  [72-94] 72 (05/30 0600) Resp:  [16-18] 18 (05/30 0600) BP: (103-146)/(41-67) 116/67 mmHg (05/30 0600) SpO2:  [94 %-98 %] 95 % (05/30 0600) Weight:  [51.075 kg (112 lb 9.6 oz)] 51.075 kg (112 lb 9.6 oz) (05/29 1310) Weight change:  Last BM Date: 02/18/13  CBG (last 3)  No results found for this basename: GLUCAP,  in the last 72 hours  Intake/Output from previous day: 05/29 0701 - 05/30 0700 In: 1400 [I.V.:700; Blood:700] Out: 200 [Urine:200] Intake/Output this shift:    General appearance: alert and less pale appearing Eyes: no scleral icterus Throat: oropharynx moist without erythema Resp: clear to auscultation bilaterally Cardio: regular rate and rhythm GI: soft, non-tender; bowel sounds normal; no masses,  no organomegaly Extremities: no clubbing, cyanosis or edema   Lab Results: No results found for this basename: NA, K, CL, CO2, GLUCOSE, BUN, CREATININE, CALCIUM, MG, PHOS,  in the last 72 hours No results found for this basename: AST, ALT, ALKPHOS, BILITOT, PROT, ALBUMIN,  in the last 72 hours  Recent Labs  02/17/13 1440 02/18/13 0700  WBC 7.7 6.1  HGB 5.2* 8.5*  HCT 18.7* 28.0*  MCV 63.2* 69.7*  PLT 428* 330    Recent Labs  02/17/13 1440  VITAMINB12 470  FOLATE >20.0  FERRITIN 3*  TIBC 456  IRON 11*  RETICCTPCT 2.8    Studies/Results: No results found.   Medications: Scheduled: . acetaminophen  650 mg Oral Once  . calcium citrate  200 mg of elemental calcium Oral Daily  . darifenacin  7.5 mg Oral Daily  . diphenhydrAMINE  25 mg Oral Once  . iron polysaccharides  150 mg Oral Daily  . levothyroxine  75 mcg Oral QAC breakfast  . multivitamin with minerals  1 tablet Oral Daily   . pantoprazole  40 mg Oral Daily   Continuous:   Assessment/Plan: Principal Problem: 1. ANEMIA, IRON DEFICIENCY- improved after 2 units PRBCs.  Will give 1 additional unit due to likelihood that she will continue to drift down with presumed chronic GI blood loss. Received IV iron this am. Will continue Niferrex.   Active Problems: 2. ANGIODYSPLASIA-INTESTINE, history of- appreciate GI input. Will hold off on endoscopic evaluation unless she is losing blood rapidly.  Reassess in 2 weeks as outpatient.  3. Asthma, chronic- continue Albuterol prn.  Consider pulmicort nebs if dyspnea persists despite resolution of anemia. 4. Postural dizziness- ambulate with assistance today. 5. Disposition- Monitor blood counts for 24 hours and discharge to home tomorrow if stable.  Arrange home health PT/OT, social work for resources.  Follow-up within 1 week for repeat CBC.   LOS: 1 day   Teresa Callahan,W DOUGLAS 02/18/2013, 7:35 AM

## 2013-02-18 NOTE — Progress Notes (Signed)
[facility] Progress Note    Since last GI note: Received 2 units blood last night, iv iron infusion.  Slept poorly, ate well.  No pains.  Had brown formed stool yesterday.  Objective: Vital signs in last 24 hours: Temp:  [97.3 F (36.3 C)-98.6 F (37 C)] 98.1 F (36.7 C) (05/30 0600) Pulse Rate:  [72-94] 72 (05/30 0600) Resp:  [16-18] 18 (05/30 0600) BP: (103-146)/(41-67) 116/67 mmHg (05/30 0600) SpO2:  [94 %-98 %] 95 % (05/30 0600) Weight:  [112 lb 9.6 oz (51.075 kg)] 112 lb 9.6 oz (51.075 kg) (05/29 1310) Last BM Date: 02/18/13 General: alert and oriented times 3 Heart: regular rate and rythm Abdomen: soft, non-tender, non-distended, normal bowel sounds   Lab Results:  Recent Labs  02/17/13 1440 02/18/13 0700  WBC 7.7 6.1  HGB 5.2* 8.5*  PLT 428* 330  MCV 63.2* 69.7*    Recent Labs  02/18/13 0700  NA 136  K 4.6  CL 107  CO2 21  GLUCOSE 83  BUN 17  CREATININE 0.69  CALCIUM 9.2   No results found for this basename: PROT, ALBUMIN, AST, ALT, ALKPHOS, BILITOT, BILIDIR, IBILI,  in the last 72 hours No results found for this basename: INR,  in the last 72 hours   Studies/Results: No results found.   Medications: Scheduled Meds: . acetaminophen  650 mg Oral Once  . calcium citrate  200 mg of elemental calcium Oral Daily  . darifenacin  7.5 mg Oral Daily  . diphenhydrAMINE  25 mg Oral Once  . iron polysaccharides  150 mg Oral Daily  . levothyroxine  75 mcg Oral QAC breakfast  . multivitamin with minerals  1 tablet Oral Daily  . pantoprazole  40 mg Oral Daily   Continuous Infusions:  PRN Meds:.acetaminophen, acetaminophen, albuterol, ondansetron (ZOFRAN) IV, ondansetron, polyethylene glycol    Assessment/Plan: 79 y.o. female with (chronic) heme positive stool, IDA  Continue plan as described yesterday.  She will be getting one more unit blood today.  Likely home later today or tomorrow. She knows to continue once daily iron supplement,  indefinitely.  We will schedule return office visit at [facility] in 5-6 weeks to reassess blood counts, response to iron, reconsider endoscopic workup if patient and family wish.   Rob Bunting, MD  02/18/2013, 8:10 AM [facility] Pager [phone]

## 2013-02-18 NOTE — Evaluation (Addendum)
Physical Therapy Evaluation Patient Details Name: Teresa Callahan MRN: [id number] DOB: [birth year] Today's Date: 02/18/2013 Time: 1040-1100 PT Time Calculation (min): 20 min  PT Assessment / Plan / Recommendation Clinical Impression  79 y.o. female admitted with anemia (Hgb 5.2). Pt received 2 units of blood yesterday, Hgb now 8.5. At baseline pt lives alone and is independent with mobility. Today she ambulated 140' without assistive device with no LOB. From PT standpoint pt is independent with transfers, bed mobility, and ambulation and is safe to DC home. No further PT needs. Will sign off.     PT Assessment  Patent does not need any further PT services    Follow Up Recommendations  No PT follow up    Does the patient have the potential to tolerate intense rehabilitation      Barriers to Discharge        Equipment Recommendations  None recommended by PT    Recommendations for Other Services     Frequency      Precautions / Restrictions Precautions Precautions: None Precaution Comments: no h/o falls Restrictions Weight Bearing Restrictions: No   Pertinent Vitals/Pain *0/10 pain at rest and with walking**      Mobility  Bed Mobility Bed Mobility: Supine to Sit Supine to Sit: 7: Independent Transfers Transfers: Sit to Stand;Stand to Sit Sit to Stand: 7: Independent;From bed Stand to Sit: 7: Independent;To chair/3-in-1 Ambulation/Gait Ambulation/Gait Assistance: 7: Independent Ambulation Distance (Feet): 140 Feet Assistive device: None Gait Pattern: Within Functional Limits    Exercises     PT Diagnosis:    PT Problem List:   PT Treatment Interventions:     PT Goals    Visit Information  Last PT Received On: 02/18/13 Assistance Needed: +1    Subjective Data  Subjective: I don't feel dizzy today.  Patient Stated Goal: return to book clubs, going to symphony   Prior Functioning  Home Living Lives With: Alone Available Help at Discharge: Family Type  of Home: House Home Access: Stairs to enter Secretary/administrator of Steps: 1 Home Layout: Two level Alternate Level Stairs-Number of Steps: has Paramedic Shower/Tub: Banker: Shower chair with back Prior Function Level of Independence: Independent Driving: Yes Vocation: Retired Musician: No difficulties    Copywriter, advertising Arousal/Alertness: Awake/alert Behavior During Therapy: WFL for tasks assessed/performed Overall Cognitive Status: Within Functional Limits for tasks assessed    Extremity/Trunk Assessment Right Upper Extremity Assessment RUE ROM/Strength/Tone: WFL for tasks assessed Left Upper Extremity Assessment LUE ROM/Strength/Tone: WFL for tasks assessed Right Lower Extremity Assessment RLE ROM/Strength/Tone: Within functional levels RLE Sensation: WFL - Light Touch RLE Coordination: WFL - gross/fine motor Left Lower Extremity Assessment LLE ROM/Strength/Tone: Within functional levels LLE Sensation: WFL - Light Touch LLE Coordination: WFL - gross/fine motor Trunk Assessment Trunk Assessment: Kyphotic (forward head, rounded shoulders)   Balance    End of Session PT - End of Session Equipment Utilized During Treatment: Gait belt Activity Tolerance: Patient tolerated treatment well Patient left: in chair;with call bell/phone within reach;with family/visitor present Nurse Communication: Mobility status  GP     Ralene Bathe Kistler 02/18/2013, 11:07 AM [phone]

## 2013-02-18 NOTE — Evaluation (Signed)
Occupational Therapy Evaluation Patient Details Name: Teresa Callahan MRN: [id number] DOB: 08/12/1918 Today's Date: 02/18/2013 Time: 4098-1191 OT Time Calculation (min): 20 min  OT Assessment / Plan / Recommendation Clinical Impression  This 79 year old female was admitted with anemia.  She received a blood transfusion in the hospital.  At baseline, she is independent and very active.  She does not feel quite back to baseline, but she is mod I to I with adls.  Reviewed energy conservation strategies and pt verbalizes.  She will not need any further OT at this time.      OT Assessment  Patient does not need any further OT services    Follow Up Recommendations  No OT follow up    Barriers to Discharge      Equipment Recommendations  None recommended by OT    Recommendations for Other Services    Frequency       Precautions / Restrictions Precautions Precautions: None Precaution Comments: no h/o falls Restrictions Weight Bearing Restrictions: No   Pertinent Vitals/Pain No c/o pain    ADL  Toilet Transfer: Modified independent Toilet Transfer Method: Sit to stand Toilet Transfer Equipment: Comfort height toilet Toileting - Clothing Manipulation and Hygiene: Independent Where Assessed - Toileting Clothing Manipulation and Hygiene: Sit to stand from 3-in-1 or toilet Tub/Shower Transfer: Modified independent Tub/Shower Transfer Method: Ambulating Tub/Shower Transfer Equipment: Walk in shower Transfers/Ambulation Related to ADLs: ambulated to bathroom--pt initially held onto bed as she walked, then able to walk without reaching for anything. ADL Comments: Pt will return home alone.  She states family can help if needed.  Reviewed energy conservation, and hand out given.  Pt is mod I to I with adls--retrieving items.  Initially reached for bed foot rest  when she got up then had no difficulty    OT Diagnosis:    OT Problem List:   OT Treatment Interventions:     OT Goals     Visit Information  Last OT Received On: 02/18/13 Assistance Needed: +1    Subjective Data  Subjective: We put grab bars in the shower for my husband.  We just got a seat for me Patient Stated Goal: wants to get energy back and be able to do what she wants   Prior Functioning     Home Living Lives With: Alone Bathroom Shower/Tub: Walk-in shower (grab bars) Firefighter: Standard Home Adaptive Equipment: Shower chair with back Prior Function Level of Independence: Independent Communication Communication: No difficulties         Vision/Perception     Cognition  Cognition Arousal/Alertness: Awake/alert Behavior During Therapy: WFL for tasks assessed/performed Overall Cognitive Status: Within Functional Limits for tasks assessed    Extremity/Trunk Assessment Right Upper Extremity Assessment RUE ROM/Strength/Tone: [facility] for tasks assessed Left Upper Extremity Assessment LUE ROM/Strength/Tone: WFL for tasks assessed Trunk Assessment Trunk Assessment: Kyphotic     Mobility Bed Mobility Supine to Sit: 7: Independent Transfers Sit to Stand: From toilet;From bed;7: Independent     Exercise     Balance     End of Session OT - End of Session Activity Tolerance: Patient tolerated treatment well Patient left: in bed;with call bell/phone within reach  GO     [facility] 02/18/2013, 3:46 PM Marica Otter, OTR/L [phone] 02/18/2013

## 2013-02-18 NOTE — Progress Notes (Signed)
Patient ID: Teresa Callahan, female   DOB: 1917/12/06, 79 y.o.   MRN: [id number]      Appointment scheduled with Dr. Marina Goodell on July  7th at 10:45 am

## 2013-02-19 LAB — BASIC METABOLIC PANEL
BUN: 8 mg/dL (ref 6–23)
CO2: 23 mEq/L (ref 19–32)
Chloride: 107 mEq/L (ref 96–112)
Creatinine, Ser: 0.63 mg/dL (ref 0.50–1.10)
GFR calc Af Amer: 88 mL/min — ABNORMAL LOW (ref >90)
Glucose, Bld: 82 mg/dL (ref 70–99)
Potassium: 4.1 mEq/L (ref 3.5–5.1)

## 2013-02-19 LAB — TYPE AND SCREEN
Antibody Screen: NEGATIVE
Unit division: 0
Unit division: 0

## 2013-02-19 LAB — CBC
HCT: 31.6 % — ABNORMAL LOW (ref 36.0–46.0)
Hemoglobin: 9.7 g/dL — ABNORMAL LOW (ref 12.0–15.0)
MCV: 72.3 fL — ABNORMAL LOW (ref 78.0–100.0)
RBC: 4.37 MIL/uL (ref 3.87–5.11)
WBC: 7.7 10*3/uL (ref 4.0–10.5)

## 2013-02-19 MED ORDER — POLYSACCHARIDE IRON COMPLEX 150 MG PO CAPS: 150.0000 mg | ORAL_CAPSULE | Freq: Every day | ORAL | Status: DC

## 2013-02-19 MED ORDER — ACETAMINOPHEN 325 MG PO TABS: 650.0000 mg | ORAL_TABLET | Freq: Four times a day (QID) | ORAL | Status: DC | PRN

## 2013-02-19 MED ORDER — NAPROXEN SODIUM 220 MG PO TABS: ORAL_TABLET | ORAL | Status: DC

## 2013-02-19 MED ORDER — ALBUTEROL SULFATE HFA 108 (90 BASE) MCG/ACT IN AERS: 2.0000 | INHALATION_SPRAY | Freq: Four times a day (QID) | RESPIRATORY_TRACT | Status: DC | PRN

## 2013-02-19 NOTE — Discharge Summary (Signed)
Physician Discharge Summary  DISCHARGE SUMMARY   Patient ID: Teresa Callahan MR#: [id number] DOB/AGE: [birth year] 77 y.o.   Attending Physician:Ryon Layton M  Patient's YQM:VHQI,O Riley Lam, MD  Consults:  GI - Dr Christella Hartigan  Admit date: 02/17/2013 Discharge date: 02/19/2013  Discharge Diagnoses:  Principal Problem:   ANEMIA, IRON DEFICIENCY Active Problems:   ANGIODYSPLASIA-INTESTINE   Asthma, chronic   Postural dizziness   Patient Active Problem List   Diagnosis Date Noted  . Asthma, chronic 02/17/2013  . Postural dizziness 02/17/2013  . Pulmonary infiltrates 11/24/2011  . Chronic UTI 11/24/2011  . ANGIODYSPLASIA-INTESTINE 06/25/2010  . ANEMIA, IRON DEFICIENCY 05/07/2010  . FECAL OCCULT BLOOD 05/07/2010   Past Medical History  Diagnosis Date  . UTI (lower urinary tract infection)   . Asthma   . Thyroid disease   . Diverticulosis   . Anemia   . Carotid stenosis   . Vertigo   . GERD (gastroesophageal reflux disease)   . Postmenopausal   . Atrophic vaginitis   . Osteoporosis   . Incontinence     Discharged Condition: good   Discharge Medications:   Medication List    TAKE these medications       acetaminophen 325 MG tablet  Commonly known as:  TYLENOL  Take 2 tablets (650 mg total) by mouth every 6 (six) hours as needed.     albuterol 108 (90 BASE) MCG/ACT inhaler  Commonly known as:  PROVENTIL HFA;VENTOLIN HFA  Inhale 2 puffs into the lungs every 6 (six) hours as needed for shortness of breath.     ALIGN 4 MG Caps  Take 1 capsule by mouth daily.     beclomethasone 80 MCG/ACT inhaler  Commonly known as:  QVAR  Inhale 1 puff into the lungs 2 (two) times daily.     calcium citrate 950 MG tablet  Commonly known as:  CALCITRATE - dosed in mg elemental calcium  Take 1 tablet by mouth daily.     iron polysaccharides 150 MG capsule  Commonly known as:  NIFEREX  Take 1 capsule (150 mg total) by mouth daily.     lansoprazole 15 MG capsule  Commonly  known as:  PREVACID  Take 15 mg by mouth daily.     levothyroxine 75 MCG tablet  Commonly known as:  SYNTHROID, LEVOTHROID  Take 75 mcg by mouth daily.     multivitamin with minerals Tabs  Take 1 tablet by mouth daily.     naproxen sodium 220 MG tablet  Commonly known as:  ANAPROX  Take 1 tablet (220 mg total) by mouth 2 (two) times daily as needed (for pain).  Do not take again until at least June 15th due to recent GI Bleed.     polyethylene glycol packet  Commonly known as:  MIRALAX / GLYCOLAX  Take 17 g by mouth daily as needed (for constipation).     solifenacin 10 MG tablet  Commonly known as:  VESICARE  Take 10 mg by mouth daily.        Hospital Procedures: No results found.  History of Present Illness:  79 F admitted from our office 02/17/13 with Sxs dizzy today, back pain, pallor, lightheadedness,  diastolic hypotension, and not feeling well for the past few weeks. Gets dizzy when she stands. Increased fatigue. She does have a history of iron deficiency anemia. Prior evaluations showed colonic AVMs several years ago. She occasionally sees bright red blood in her stool. Denies any dark tarry stools.  Gets shortness of breath  and pain across shoulders when walking. Resolves when she lies down. No chest pain. Onset after recent asthma attack which resolved. Remains on Proair. Asthma is worse in the winter/cold.  Labs were obtained in the office that showed a hemoglobin of 5.2 with an MCV of 62. BUN/creatinine are 25 and 0.9 respectively. Her CMET is otherwise normal. Ferritin is 1.9. Other iron test were pending. Given her profound symptomatic anemia she was admitted for further management.   Hospital Course: Admitted c Profound Sxatic Anemia c Hbg of 5.2 She was transfused aggressively and Hbg came up nicely and as expected. On 5/30 she felt better and tolerated 2U PRBC and IV Iron. Small BM was normal in appearance.  Hbg came up from 5.2 to 8.5 and she was given one more  unit of blood.   She was seen by GI - Dr Wendall Papa in consult. Per his consult note: long discussion with patient and her daughter regarding endoscopic evaluation. They are reluctant at this time and would like to proceed with transfusions and iron therapy and then reevaluate in several weeks. If there is evidence of ongoing blood loss then proceed with endoscopic intervention at that time. This is a reasonable approach and patient and family are aware that we cannot rule out occult malignancy at this time. Patient has been intolerant to oral iron in the past and would give her a trial of either Nu-Iron or Integra plus which may be easier to tolerate.Zane Herald Iron was started and will be continued. She knows to continue once daily iron supplement, indefinitely. Dr Christella Hartigan will schedule return office visit at [facility] in 5-6 weeks to reassess blood counts, response to iron, reconsider endoscopic workup if patient and family wish.  Dr Clelia Croft ordered [facility] services but family and pt declined. PT and OT saw pt and felt no services needed.  1. ANEMIA, IRON DEFICIENCY- improved post 3 units PRBCs and IV iron.  She will take oral iron per Rx. F/Up our office 1-2 weeks for repeat labs. No NSAIDs for at least 2 weeks. 2. ANGIODYSPLASIA-INTESTINE, history of- Hold on endoscopic evaluation unless she is losing blood rapidly. Reassess as outpatient.  3. Asthma, chronic- continue Albuterol prn. Consider pulmicort nebs if dyspnea persists despite resolution of anemia.  4. Postural dizziness- She did well when she ambulated with assistance today.  5. Disposition- Hbg up to 9.7 and she was seen on am rounds 5/31 and she is ready for D/c - No new C/o  Day of Discharge Exam BP 147/59  Pulse 76  Temp(Src) 98 F (36.7 C) (Oral)  Resp 16  Ht 5\' 1"  (1.549 m)  Wt 51.075 kg (112 lb 9.6 oz)  BMI 21.29 kg/m2  SpO2 95%  Physical Exam: General appearance: alert and not that pale appearing  Eyes: no scleral icterus  Throat:  oropharynx moist without erythema  Resp: clear to auscultation bilaterally  Cardio: regular rate and rhythm  GI: soft, non-tender; bowel sounds normal; no masses, no organomegaly  Extremities: no clubbing, cyanosis or edema  Discharge Labs:  Recent Labs  02/18/13 0700 02/19/13 0545  NA 136 137  K 4.6 4.1  CL 107 107  CO2 21 23  GLUCOSE 83 82  BUN 17 8  CREATININE 0.69 0.63  CALCIUM 9.2 9.1   No results found for this basename: AST, ALT, ALKPHOS, BILITOT, PROT, ALBUMIN,  in the last 72 hours  Recent Labs  02/18/13 0700 02/18/13 2245 02/19/13 0545  WBC 6.1  --  7.7  HGB 8.5* 9.5* 9.7*  HCT 28.0* 30.0* 31.6*  MCV 69.7*  --  72.3*  PLT 330  --  322   No results found for this basename: CKTOTAL, CKMB, CKMBINDEX, TROPONINI,  in the last 72 hours No results found for this basename: TSH, T4TOTAL, FREET3, T3FREE, THYROIDAB,  in the last 72 hours  Recent Labs  02/17/13 1440  VITAMINB12 470  FOLATE >20.0  FERRITIN 3*  TIBC 456  IRON 11*  RETICCTPCT 2.8   No results found for this basename: INR,  PROTIME       Discharge instructions:  Future Appointments Provider Department Dept Phone   03/28/2013 10:45 AM Hilarie Fredrickson, MD [facility] [phone]     01-Home or Self Care Follow-up Information   Follow up with Yancey Flemings, MD On 03/28/2013. (at 10:45 am)    Contact information:   [address] [place] [address] [phone]       Follow up with Kari Baars, MD In 7 days.   Contact information:   [address] [place] [address] [phone]        Disposition: Home  Follow-up Appts: Follow-up with Dr. Clelia Croft at [facility] in 1-2 weeks.  Call for appointment.  Condition on Discharge: stable  Tests Needing Follow-up: CBC  Time spent in discharge (includes decision making & examination of pt): 35 min  Signed: Karmello Abercrombie M 02/19/2013, 8:03 AM

## 2013-02-19 NOTE — Progress Notes (Signed)
Patient and daughter were given discharge instructions along with prescriptions. Follow up appointments are in place. Patient and daughter verbalized understanding of the discharge instructions. Patient escorted down to personal vehicle by nursing staff along with daughter. All belongings removed from patient's room by her son prior to discharge.

## 2013-03-28 ENCOUNTER — Ambulatory Visit (INDEPENDENT_AMBULATORY_CARE_PROVIDER_SITE_OTHER): Payer: Medicare Other | Admitting: Internal Medicine

## 2013-03-28 ENCOUNTER — Encounter: Payer: Self-pay | Admitting: Internal Medicine

## 2013-03-28 ENCOUNTER — Other Ambulatory Visit (INDEPENDENT_AMBULATORY_CARE_PROVIDER_SITE_OTHER): Payer: Medicare Other

## 2013-03-28 VITALS — BP 92/54 | HR 80 | Ht 61.5 in | Wt 115.8 lb

## 2013-03-28 DIAGNOSIS — K5520 Angiodysplasia of colon without hemorrhage: Secondary | ICD-10-CM

## 2013-03-28 DIAGNOSIS — Q2733 Arteriovenous malformation of digestive system vessel: Secondary | ICD-10-CM

## 2013-03-28 DIAGNOSIS — D509 Iron deficiency anemia, unspecified: Secondary | ICD-10-CM

## 2013-03-28 LAB — CBC WITH DIFFERENTIAL/PLATELET
Basophils Relative: 0 % (ref 0.0–3.0)
Eosinophils Absolute: 0.3 10*3/uL (ref 0.0–0.7)
Eosinophils Relative: 5 % (ref 0.0–5.0)
Hemoglobin: 12.3 g/dL (ref 12.0–15.0)
MCHC: 32.9 g/dL (ref 30.0–36.0)
MCV: 85.3 fl (ref 78.0–100.0)
Monocytes Absolute: 0.9 10*3/uL (ref 0.1–1.0)
Neutro Abs: 2.9 10*3/uL (ref 1.4–7.7)
Neutrophils Relative %: 55.3 % (ref 43.0–77.0)
RBC: 4.37 Mil/uL (ref 3.87–5.11)
WBC: 5.3 10*3/uL (ref 4.5–10.5)

## 2013-03-28 NOTE — Progress Notes (Signed)
HISTORY OF PRESENT ILLNESS:  Teresa Callahan is a 79 y.o. female with past medical history as outlined below. She was hospitalized in late May with profound symptomatic iron deficiency anemia. Admission hemoglobin 5.2. Prior evaluation for this problem demonstrated multiple cecal AVMs. She was transfused and placed on oral iron therapy. This routine followup appointment was scheduled at the time of hospital discharge. She states that she's feeling much better. No active GI complaints. She is tolerating her oral iron, though her pharmacy has it on backorder. Her discharge hemoglobin was 9.7  REVIEW OF SYSTEMS:  All non-GI ROS negative entirely  Past Medical History  Diagnosis Date  . UTI (lower urinary tract infection)   . Asthma   . Thyroid disease   . Diverticulosis   . Anemia   . Carotid stenosis   . Vertigo   . GERD (gastroesophageal reflux disease)   . Postmenopausal   . Atrophic vaginitis   . Osteoporosis   . Incontinence     Past Surgical History  Procedure Laterality Date  . Abdominal hysterectomy  1957  . Cataract extraction      bilat.  . Colonoscopy  09/30/2001  . Left eye surgery      d/t complications from cataract surgery  . Tonsillectomy      Social History Teresa Callahan  reports that she quit smoking about 64 years ago. Her smoking use included Cigarettes. She has a 1.6 pack-year smoking history. She has never used smokeless tobacco. She reports that she does not drink alcohol or use illicit drugs.  family history includes Colon cancer in her mother; Lymphoma in her son; and Melanoma in her father.  Allergies  Allergen Reactions  . Horse-Derived Products Other (See Comments)    Loss of appetite  . Macrodantin   . Premarin (Estrogens, Conjugated)   . Sulfa Antibiotics Other (See Comments)    Loss of appetite        PHYSICAL EXAMINATION: Vital signs: BP 92/54  Pulse 80  Ht 5' 1.5" (1.562 m)  Wt 115 lb 12.8 oz (52.527 kg)  BMI 21.53 kg/m2 General:  Well-developed, well-nourished, no acute distress HEENT: Sclerae are anicteric, conjunctiva pink. Oral mucosa intact Lungs: Clear Heart: Regular Abdomen: soft, nontender, nondistended, no obvious ascites, no peritoneal signs, normal bowel sounds. No organomegaly. Extremities: No edema Psychiatric: alert and oriented x3. Cooperative   ASSESSMENT:  #1. Iron deficiency anemia likely secondary to known colonic AVMs. Clinically asymptomatic post transfusion on iron therapy   PLAN:  #1. Check CBC and ferritin level today #2. Continue on iron indefinitely #3. Resume general medical care with Dr. Clelia Croft. GI followup as needed

## 2013-03-28 NOTE — Patient Instructions (Addendum)
Your physician has requested that you go to the basement for the following lab work before leaving today:  CBC, Ferritin level

## 2013-06-27 ENCOUNTER — Telehealth: Payer: Self-pay | Admitting: Obstetrics and Gynecology

## 2013-06-27 NOTE — Telephone Encounter (Signed)
Spoke with patient. She is wondering if she needs to come in to office. States that she is having two weeks of increased abdominal distention and has went from a size 6 to size 12 with no change in weight. Denies complaints, states "I am very active".  Patient agreeable to coming in for appointment, declines office visit for today and first available at 0830 tomorrow with Dr. Hyacinth Meeker, states "I am not up that early!" Appointment scheduled with Dr. Hyacinth Meeker for 10/7 at 1045. Patient agreeable.  Advised patient to return call if persists or worsens, advised to return call or go to nearest emergency room if develops fever, flank pain, chills, nausea, vomiting. Patient verbalized understanding.

## 2013-06-27 NOTE — Telephone Encounter (Signed)
Patient is calling about a concern she is having with her stomach she said her has expanded tremulously in the last two weeks and wants to talk to a nurse.

## 2013-06-28 ENCOUNTER — Ambulatory Visit (INDEPENDENT_AMBULATORY_CARE_PROVIDER_SITE_OTHER): Payer: Medicare Other | Admitting: Obstetrics & Gynecology

## 2013-06-28 ENCOUNTER — Encounter: Payer: Self-pay | Admitting: Obstetrics & Gynecology

## 2013-06-28 ENCOUNTER — Telehealth: Payer: Self-pay | Admitting: Obstetrics & Gynecology

## 2013-06-28 VITALS — BP 110/72 | HR 80 | Resp 12 | Ht 61.5 in | Wt 118.0 lb

## 2013-06-28 DIAGNOSIS — R140 Abdominal distension (gaseous): Secondary | ICD-10-CM

## 2013-06-28 DIAGNOSIS — R141 Gas pain: Secondary | ICD-10-CM

## 2013-06-28 DIAGNOSIS — R1900 Intra-abdominal and pelvic swelling, mass and lump, unspecified site: Secondary | ICD-10-CM

## 2013-06-28 LAB — BASIC METABOLIC PANEL
BUN: 20 mg/dL (ref 6–23)
CO2: 30 mEq/L (ref 19–32)
Glucose, Bld: 71 mg/dL (ref 70–99)
Potassium: 4.8 mEq/L (ref 3.5–5.3)
Sodium: 136 mEq/L (ref 135–145)

## 2013-06-28 NOTE — Progress Notes (Addendum)
Subjective:     Patient ID: Teresa Callahan, female   DOB: 06-May-1918, 79 y.o.   MRN: [id number]  HPI 79 yo here for 3-4 weeks of increasing abdominal girth.  She reports she tried to lose some weight earlier this year.  Lost about 20 pounds.  Weighed 115 5/13, last visit here.  Today weighed 188.  Hospitalized 5/14 due to anemia.  Had 3 blood transfusions.  Stopped taking iron before the anemia because it was causing nausea.  Iron was changed to different brand and nausea is gone.  Hb was 12.3 7/714 with Dr. Marina Goodell.    No vaginal bleeding.  No pelvic pain.  Does report worsening incontinence, mildly worse in last six months or so.  Constipated but takes Miralax.  This is not a new issue for patient.  No fever.  Does have some streaky blood present after a hard bowel movement.  This is just on the toilet paper and not new.    Last colonoscopy was 2006.  H/O AVM and diverticulosis.  H/O TAH/BSO 1969 due to endometriosis.  Review of Systems  All other systems reviewed and are negative.       Objective:   Physical Exam  Constitutional: She appears well-developed and well-nourished.  Neck: Normal range of motion. Neck supple. No thyromegaly present.  Abdominal: Soft. Normal appearance and bowel sounds are normal. She exhibits distension and mass (with mulltiple areas of hard stool noted.  Difficult to know if this is all stool.). There is no splenomegaly or hepatomegaly. There is no tenderness. There is no rebound and no guarding. No hernia.  Genitourinary: Vagina normal. No breast swelling, tenderness, discharge or bleeding. There is no tenderness on the right labia. There is no tenderness on the left labia. Uterus is not deviated (absent uterus, cervix, and adnexa.). Right adnexum displays no fullness. Left adnexum displays no fullness. No vaginal discharge (atrophic changes) found.  Lymphadenopathy:    She has no cervical adenopathy.    She has no axillary adenopathy.       Right: No inguinal  adenopathy present.       Left: No inguinal adenopathy present.       Assessment:     Abdominal distension, no weight change in >1 year, constipation  R/O abdominal mass    Plan:     CT abdomen and pelvis with PO/IV contrast BMP today

## 2013-06-28 NOTE — Telephone Encounter (Signed)
Returned call to [name]) precert # [id number] was given for CT of Abdomen/Pelvis With  PO IV Contrast   appt 06/29/13 @ 3:00.

## 2013-06-28 NOTE — Telephone Encounter (Signed)
Teresa Callahan from [facility] is calling she needs a precert for patient who has an appt for tomorrow.

## 2013-06-29 ENCOUNTER — Ambulatory Visit
Admission: RE | Admit: 2013-06-29 | Discharge: 2013-06-29 | Disposition: A | Discharge status: Home / Self Care | Payer: Medicare Other | Source: Ambulatory Visit | Attending: Obstetrics & Gynecology | Admitting: Obstetrics & Gynecology

## 2013-06-29 DIAGNOSIS — R140 Abdominal distension (gaseous): Secondary | ICD-10-CM

## 2013-06-29 DIAGNOSIS — R1900 Intra-abdominal and pelvic swelling, mass and lump, unspecified site: Secondary | ICD-10-CM

## 2013-06-29 MED ORDER — IOHEXOL 300 MG/ML  SOLN
100.0000 mL | Freq: Once | INTRAMUSCULAR | Status: AC | PRN
  Administered 2013-06-29: 100 mL via INTRAVENOUS

## 2013-06-30 ENCOUNTER — Other Ambulatory Visit: Payer: Medicare Other

## 2013-06-30 NOTE — Telephone Encounter (Signed)
Dr. Hyacinth Meeker, her CT is resulted. What do you suggest for follow up?

## 2013-06-30 NOTE — Telephone Encounter (Signed)
Patients Daughter called saying she was waiting for results from her mothers CT scan she had yesterday. She said we can call her or her brother with teh results they both have medical power of attorney. She said to just call her when the results get in they told her it wouldn't take long at all.

## 2013-06-30 NOTE — Telephone Encounter (Signed)
Spoke with [facility], they scheduled appointment for 10/14 at 0915 with Dr. Romie Levee.   Spoke with patient. Advised of results of CT scan.  She feels she does not need to have surgical appointment and requests cancellation, which she called and cancelled while I as on the phone with her daughter Teresa Callahan.  She states that last night she had diarrhea but feels it was r/t contrast medium from CT scan. Patient states she has not had diarrhea today, she states "I feel fine". Denies fevers, denies constipation, denies vomiting, denies increase in distention, denies abdominal and states she is passing flatus. She states she is able to tolerated oral fluids and foods well. She feels she does not need any follow up. Advised patient to not wait to call 911 if diarrhea starts, or develops any fevers, increase in distention, abdominal pain, constipation.  Patient verbalizes understanding and states she will call 911 if develops any new or worsening symptoms.   Spoke with Daughter, Teresa Callahan who is Medical POA.  Advised of reading from CT Scan. Advised that f/u appointment was scheduled and patient cancelled appointment. She agreed that patient did not need surgical consult due to age of patient.  Twenty Minute conversation with Teresa Callahan to discuss CT results and s/s of emergency. She states that abdominal distention has been ongoing for longer than two weeks that patient has stated. She has a hx of regular constipation and uses Miralax only when needed for emergencies. She states that patient's Son, Teresa Callahan, checks on her two times per day and will be able to assist patient and monitor for symptoms of emergency. Verbalized understanding of need to call 911 if new symptoms or any symptoms occur.

## 2013-07-05 ENCOUNTER — Ambulatory Visit (INDEPENDENT_AMBULATORY_CARE_PROVIDER_SITE_OTHER): Payer: Self-pay | Admitting: General Surgery

## 2013-08-01 ENCOUNTER — Telehealth: Payer: Self-pay | Admitting: Obstetrics & Gynecology

## 2013-08-01 NOTE — Telephone Encounter (Signed)
Patient needs to give info to Dr. Hyacinth Meeker regarding a recent CT scan.

## 2013-08-01 NOTE — Telephone Encounter (Signed)
Yes.  This could be a result of an intermittent bowel obstruction. Is she sure she doesn't at least want a surgeon's opinion about this??

## 2013-08-01 NOTE — Telephone Encounter (Signed)
Patient calling to obtain advice. Has been having intermittent diarrhea x 2 weeks. States that it just comes on "out of nowhere" and she takes anti-diarrheal pills and then it improves. Last episode was today. No further complaints. No fevers or abdominal pain. We discussed previous results of CT scan again, patient did not agree to follow up at that time. She wanted to know if the diarrhea may be caused by the "issue that was seen on the scan."  Patient wanted Dr. Hyacinth Meeker to be aware of diarrhea.

## 2013-08-02 NOTE — Telephone Encounter (Signed)
Busy Signal. Will have to try again.

## 2013-08-05 NOTE — Telephone Encounter (Signed)
Spoke with patient. She states she is feeling improved. She declines surgery consult again, states "Unless it gets much worse, I'm okay". Advised to call back if needs anything further or decides she would like consult, patient is agreeable.

## 2013-08-31 ENCOUNTER — Telehealth: Payer: Self-pay | Admitting: Emergency Medicine

## 2013-08-31 NOTE — Telephone Encounter (Signed)
Patient here to office. Patient thought she had a 3 pm appointment with Dr. Hyacinth Meeker. Advised patient that she does not have appointment. Patient here with son who is driving. Called to Dr. Alver Fisher office, patient has appointment with Olegario Messier to discuss bone density testing. They will see patient, but may have to wait. Patient is agreeable and will go over now with son.

## 2013-09-14 ENCOUNTER — Encounter (HOSPITAL_COMMUNITY): Payer: Self-pay | Admitting: Emergency Medicine

## 2013-09-14 ENCOUNTER — Emergency Department (HOSPITAL_COMMUNITY): Payer: Medicare Other

## 2013-09-14 ENCOUNTER — Emergency Department (HOSPITAL_COMMUNITY)
Admission: EM | Admit: 2013-09-14 | Discharge: 2013-09-14 | Disposition: A | Discharge status: Home / Self Care | Payer: Medicare Other | Attending: Emergency Medicine | Admitting: Emergency Medicine

## 2013-09-14 DIAGNOSIS — D649 Anemia, unspecified: Secondary | ICD-10-CM | POA: Insufficient documentation

## 2013-09-14 DIAGNOSIS — M549 Dorsalgia, unspecified: Secondary | ICD-10-CM

## 2013-09-14 DIAGNOSIS — Z8679 Personal history of other diseases of the circulatory system: Secondary | ICD-10-CM | POA: Insufficient documentation

## 2013-09-14 DIAGNOSIS — K219 Gastro-esophageal reflux disease without esophagitis: Secondary | ICD-10-CM | POA: Insufficient documentation

## 2013-09-14 DIAGNOSIS — Z87891 Personal history of nicotine dependence: Secondary | ICD-10-CM | POA: Insufficient documentation

## 2013-09-14 DIAGNOSIS — Z79899 Other long term (current) drug therapy: Secondary | ICD-10-CM | POA: Insufficient documentation

## 2013-09-14 DIAGNOSIS — W19XXXA Unspecified fall, initial encounter: Secondary | ICD-10-CM

## 2013-09-14 DIAGNOSIS — Y9389 Activity, other specified: Secondary | ICD-10-CM | POA: Insufficient documentation

## 2013-09-14 DIAGNOSIS — IMO0002 Reserved for concepts with insufficient information to code with codable children: Secondary | ICD-10-CM | POA: Insufficient documentation

## 2013-09-14 DIAGNOSIS — W010XXA Fall on same level from slipping, tripping and stumbling without subsequent striking against object, initial encounter: Secondary | ICD-10-CM | POA: Insufficient documentation

## 2013-09-14 DIAGNOSIS — M810 Age-related osteoporosis without current pathological fracture: Secondary | ICD-10-CM | POA: Insufficient documentation

## 2013-09-14 DIAGNOSIS — Z8742 Personal history of other diseases of the female genital tract: Secondary | ICD-10-CM | POA: Insufficient documentation

## 2013-09-14 DIAGNOSIS — Y92009 Unspecified place in unspecified non-institutional (private) residence as the place of occurrence of the external cause: Secondary | ICD-10-CM | POA: Insufficient documentation

## 2013-09-14 DIAGNOSIS — Z87440 Personal history of urinary (tract) infections: Secondary | ICD-10-CM | POA: Insufficient documentation

## 2013-09-14 DIAGNOSIS — E079 Disorder of thyroid, unspecified: Secondary | ICD-10-CM | POA: Insufficient documentation

## 2013-09-14 DIAGNOSIS — J45909 Unspecified asthma, uncomplicated: Secondary | ICD-10-CM | POA: Insufficient documentation

## 2013-09-14 MED ORDER — OXYCODONE-ACETAMINOPHEN 5-325 MG PO TABS: 1.0000 | ORAL_TABLET | Freq: Four times a day (QID) | ORAL | Status: DC | PRN

## 2013-09-14 MED ORDER — OXYCODONE-ACETAMINOPHEN 5-325 MG PO TABS
1.0000 | ORAL_TABLET | Freq: Once | ORAL | Status: AC
  Administered 2013-09-14: 1 via ORAL
  Filled 2013-09-14: qty 1

## 2013-09-14 MED ORDER — MORPHINE SULFATE 2 MG/ML IJ SOLN
2.0000 mg | Freq: Once | INTRAMUSCULAR | Status: AC
  Administered 2013-09-14: 2 mg via INTRAVENOUS
  Filled 2013-09-14: qty 1

## 2013-09-14 NOTE — ED Notes (Signed)
Per EMS: Pt from home.  Had a fall in the bathroom, falling into the shower yesterday about 0600.  Fell against the shower bar.  C/o low back pain since.  No deformities.

## 2013-09-14 NOTE — ED Notes (Signed)
Pt ambulated with assistance, c/o back pain but pt able to walk without difficulty with assist.

## 2013-09-14 NOTE — ED Provider Notes (Signed)
CSN: [id number]     Arrival date & time 09/14/13  1245 History   First MD Initiated Contact with Patient 09/14/13 1323     Chief Complaint  Patient presents with  . Fall  . Back Pain   (Consider location/radiation/quality/duration/timing/severity/associated sxs/prior Treatment) HPI Pt presents with c/o low back pain after fall yesterday morning.  Pt states she slipped on her bathmat and fell with back hitting the threshold of the shower.  Denies hitting head.  No LOC, no vomiting or seizure activity.  Pain is worse with sitting up, worse with movement and palpation.  She has been able to get up and around house to use the bathroom, but not much else.  Pain is continuous.  She has taken alleve without much relief.  No neck pain.  No pain in legs or hips.  There are no other associated systemic symptoms, there are no other alleviating or modifying factors.   Past Medical History  Diagnosis Date  . UTI (lower urinary tract infection)   . Asthma   . Thyroid disease   . Diverticulosis   . Anemia   . Carotid stenosis   . Vertigo   . GERD (gastroesophageal reflux disease)   . Postmenopausal   . Atrophic vaginitis   . Osteoporosis   . Incontinence    Past Surgical History  Procedure Laterality Date  . Abdominal hysterectomy  1957  . Cataract extraction      bilat.  . Colonoscopy  09/30/2001  . Left eye surgery      d/t complications from cataract surgery  . Tonsillectomy     Family History  Problem Relation Age of Onset  . Colon cancer Mother   . Melanoma Father   . Lymphoma Son    History  Substance Use Topics  . Smoking status: Former Smoker -- 0.20 packs/day for 8 years    Types: Cigarettes    Quit date: 09/22/1948  . Smokeless tobacco: Never Used  . Alcohol Use: No   OB History   Grav Para Term Preterm Abortions TAB SAB Ect Mult Living   4 4        5      Review of Systems ROS reviewed and all otherwise negative except for mentioned in HPI  Allergies   Horse-derived products; Macrodantin; Premarin; and Sulfa antibiotics  Home Medications   Current Outpatient Rx  Name  Route  Sig  Dispense  Refill  . acetaminophen (TYLENOL) 325 MG tablet   Oral   Take 2 tablets (650 mg total) by mouth every 6 (six) hours as needed.   30 tablet   3   . albuterol (PROVENTIL HFA;VENTOLIN HFA) 108 (90 BASE) MCG/ACT inhaler   Inhalation   Inhale 2 puffs into the lungs every 6 (six) hours as needed for shortness of breath.         . beclomethasone (QVAR) 80 MCG/ACT inhaler   Inhalation   Inhale 1 puff into the lungs 2 (two) times daily.         . calcium citrate (CALCITRATE - DOSED IN MG ELEMENTAL CALCIUM) 950 MG tablet   Oral   Take 1 tablet by mouth daily.         . iron polysaccharides (NIFEREX) 150 MG capsule   Oral   Take 1 capsule (150 mg total) by mouth daily.   30 capsule   3   . lansoprazole (PREVACID) 15 MG capsule   Oral   Take 15 mg by mouth as needed.          Marland Kitchen  levothyroxine (SYNTHROID, LEVOTHROID) 75 MCG tablet   Oral   Take 75 mcg by mouth daily.         . Multiple Vitamin (MULITIVITAMIN WITH MINERALS) TABS   Oral   Take 1 tablet by mouth daily.         . naproxen sodium (ANAPROX) 220 MG tablet      Take 1 tablet (220 mg total) by mouth 2 (two) times daily as needed (for pain).  Do not take again until at least June 15th due to recent GI Bleed.         . polyethylene glycol (MIRALAX / GLYCOLAX) packet   Oral   Take 17 g by mouth daily as needed (for constipation).         . Probiotic Product (ALIGN) 4 MG CAPS   Oral   Take 1 capsule by mouth daily.         . solifenacin (VESICARE) 10 MG tablet   Oral   Take 10 mg by mouth daily.         . TOVIAZ 8 MG TB24 tablet               . oxyCODONE-acetaminophen (PERCOCET/ROXICET) 5-325 MG per tablet   Oral   Take 1-2 tablets by mouth every 6 (six) hours as needed for severe pain.   20 tablet   0    BP 148/70  Pulse 72  Temp(Src) 98.1 F  (36.7 C) (Oral)  Resp 20  SpO2 98% Vitals reviewed Physical Exam Physical Examination: General appearance - alert, well appearing, and in no distress Mental status - alert, oriented to person, place, and time Eyes - no scleral icterus, no conjunctival injection Mouth - mucous membranes moist, pharynx normal without lesions Neck - supple, no significant adenopathy, no midline tenderness to palpation Chest - clear to auscultation, no wheezes, rales or rhonchi, symmetric air entry Heart - normal rate, regular rhythm, normal S1, S2, no murmurs, rubs, clicks or gallops Abdomen - soft, nontender, nondistended, no masses or organomegaly Back exam - ttp in midline lumbar and over coccyx, no CVA tenderness, no paraspinal tenderness to palpation Neurological - alert, oriented, normal speech, strength 5/5 in extremities x 4, sensation intact Extremities - peripheral pulses normal, no pedal edema, no clubbing or cyanosis Skin - normal coloration and turgor, no rashes  ED Course  Procedures (including critical care time)  3:35 PM pt reports she has no further pain after pain meds in the ED.  Xray results reassuring.  Pt and son updated at bedside.  Will give po dose x 1 and pain meds for home use.  Advised recheck xray if pain persists over the next several days.   Labs Review Labs Reviewed - No data to display Imaging Review No results found.  EKG Interpretation   None       MDM   1. Fall, initial encounter   2. Back pain    Pt presenting with c/o mechanical fall, low back pain.  Xrays reassuring.  Pt feels much improved after pain meds.  Will discharge with pain meds for home use.  I have advised patient family about results and that if pain persists then repeat xrays should be performed.  Discharged with strict return precautions.  Pt agreeable with plan.    Ethelda Chick, MD 09/17/13 [id number]

## 2013-09-21 ENCOUNTER — Emergency Department (HOSPITAL_COMMUNITY): Payer: Medicare Other

## 2013-09-21 ENCOUNTER — Emergency Department (HOSPITAL_COMMUNITY)
Admission: EM | Admit: 2013-09-21 | Discharge: 2013-09-21 | Disposition: A | Discharge status: Home / Self Care | Payer: Medicare Other | Attending: Emergency Medicine | Admitting: Emergency Medicine

## 2013-09-21 DIAGNOSIS — Z79899 Other long term (current) drug therapy: Secondary | ICD-10-CM | POA: Insufficient documentation

## 2013-09-21 DIAGNOSIS — Z90710 Acquired absence of both cervix and uterus: Secondary | ICD-10-CM | POA: Insufficient documentation

## 2013-09-21 DIAGNOSIS — Z87440 Personal history of urinary (tract) infections: Secondary | ICD-10-CM | POA: Insufficient documentation

## 2013-09-21 DIAGNOSIS — G8911 Acute pain due to trauma: Secondary | ICD-10-CM | POA: Insufficient documentation

## 2013-09-21 DIAGNOSIS — R109 Unspecified abdominal pain: Secondary | ICD-10-CM | POA: Insufficient documentation

## 2013-09-21 DIAGNOSIS — M545 Low back pain, unspecified: Secondary | ICD-10-CM | POA: Insufficient documentation

## 2013-09-21 DIAGNOSIS — M549 Dorsalgia, unspecified: Secondary | ICD-10-CM

## 2013-09-21 DIAGNOSIS — M810 Age-related osteoporosis without current pathological fracture: Secondary | ICD-10-CM | POA: Insufficient documentation

## 2013-09-21 DIAGNOSIS — Z87891 Personal history of nicotine dependence: Secondary | ICD-10-CM | POA: Insufficient documentation

## 2013-09-21 DIAGNOSIS — Z8742 Personal history of other diseases of the female genital tract: Secondary | ICD-10-CM | POA: Insufficient documentation

## 2013-09-21 DIAGNOSIS — E079 Disorder of thyroid, unspecified: Secondary | ICD-10-CM | POA: Insufficient documentation

## 2013-09-21 DIAGNOSIS — D649 Anemia, unspecified: Secondary | ICD-10-CM | POA: Insufficient documentation

## 2013-09-21 DIAGNOSIS — Z8679 Personal history of other diseases of the circulatory system: Secondary | ICD-10-CM | POA: Insufficient documentation

## 2013-09-21 DIAGNOSIS — J45909 Unspecified asthma, uncomplicated: Secondary | ICD-10-CM | POA: Insufficient documentation

## 2013-09-21 DIAGNOSIS — K219 Gastro-esophageal reflux disease without esophagitis: Secondary | ICD-10-CM | POA: Insufficient documentation

## 2013-09-21 LAB — BASIC METABOLIC PANEL
BUN: 12 mg/dL (ref 6–23)
CO2: 29 mEq/L (ref 19–32)
Chloride: 93 mEq/L — ABNORMAL LOW (ref 96–112)
Creatinine, Ser: 0.66 mg/dL (ref 0.50–1.10)
GFR calc Af Amer: 86 mL/min — ABNORMAL LOW (ref >90)
Glucose, Bld: 118 mg/dL — ABNORMAL HIGH (ref 70–99)
Potassium: 4.2 mEq/L (ref 3.7–5.3)

## 2013-09-21 LAB — URINALYSIS, ROUTINE W REFLEX MICROSCOPIC
Bilirubin Urine: NEGATIVE
Glucose, UA: NEGATIVE mg/dL
Hgb urine dipstick: NEGATIVE
Nitrite: NEGATIVE
Specific Gravity, Urine: 1.012 (ref 1.005–1.030)
pH: 6.5 (ref 5.0–8.0)

## 2013-09-21 LAB — CBC WITH DIFFERENTIAL/PLATELET
Basophils Absolute: 0 10*3/uL (ref 0.0–0.1)
Eosinophils Relative: 1 % (ref 0–5)
HCT: 35.5 % — ABNORMAL LOW (ref 36.0–46.0)
Hemoglobin: 12.1 g/dL (ref 12.0–15.0)
Lymphocytes Relative: 9 % — ABNORMAL LOW (ref 12–46)
MCV: 93.9 fL (ref 78.0–100.0)
Monocytes Absolute: 0.9 10*3/uL (ref 0.1–1.0)
Monocytes Relative: 10 % (ref 3–12)
Neutro Abs: 7.3 10*3/uL (ref 1.7–7.7)
Neutrophils Relative %: 80 % — ABNORMAL HIGH (ref 43–77)
RDW: 14.5 % (ref 11.5–15.5)
WBC: 9.1 10*3/uL (ref 4.0–10.5)

## 2013-09-21 LAB — URINE MICROSCOPIC-ADD ON

## 2013-09-21 MED ORDER — HYDROCODONE-ACETAMINOPHEN 5-325 MG PO TABS: 1.0000 | ORAL_TABLET | ORAL | Status: DC | PRN

## 2013-09-21 NOTE — ED Notes (Addendum)
Per EMS patient fell three days ago, was evaluated by PCP, no notable injuries, pt has had increasing lethargy since then, along with increased constipation and decreased motility, dysuria. EMS reports that pt family states PCP wants patient reevaluated for potential missed hairline fracture. EMS 12 lead EKG shows 1st degree heart block, right bundle branch block, and occasional missed heartbeat. Family denies cardiac history. Per EMS patient is at [facility], from home. Pt c/o lower backpain radiating completely around. Pt denies hitting head during fall, denies LOC.  *On assessment, patient denies lethargy, decreased motility. *

## 2013-09-21 NOTE — ED Notes (Signed)
Pt aware of need for urine specimen. 

## 2013-09-21 NOTE — ED Notes (Signed)
Bed: WA02 Expected date:  Expected time:  Means of arrival:  Comments: EMS - Fall

## 2013-09-21 NOTE — ED Notes (Signed)
Erroneous charting on IV removal. IV catheter is intact upon removal.

## 2013-09-21 NOTE — ED Notes (Signed)
MD at bedside. 

## 2013-09-21 NOTE — ED Provider Notes (Signed)
CSN: [id number]     Arrival date & time 09/21/13  1121 History   First MD Initiated Contact with Patient 09/21/13 1127     Chief Complaint  Patient presents with  . Back Pain   (Consider location/radiation/quality/duration/timing/severity/associated sxs/prior Treatment) Patient is a 79 y.o. female presenting with back pain. The history is provided by the patient and a significant other.  Back Pain Location:  Lumbar spine Associated symptoms: abdominal pain   Associated symptoms: no fever   Associated symptoms comment:  The patient is here with her daughter who provides detailed history. Per the daughter, the patient has had persistent back pain since a fall on Christmas Eve. She was seen at that time and x-rays of the lumbar and sacrum were negative. She continues to complain of pain and is having little or no ambulation because of pain. Further per daughter, the patient has abdominal pain across the low abdomen that is debilitating when sitting up. No fever, nausea or vomiting.    Past Medical History  Diagnosis Date  . UTI (lower urinary tract infection)   . Asthma   . Thyroid disease   . Diverticulosis   . Anemia   . Carotid stenosis   . Vertigo   . GERD (gastroesophageal reflux disease)   . Postmenopausal   . Atrophic vaginitis   . Osteoporosis   . Incontinence    Past Surgical History  Procedure Laterality Date  . Abdominal hysterectomy  1957  . Cataract extraction      bilat.  . Colonoscopy  09/30/2001  . Left eye surgery      d/t complications from cataract surgery  . Tonsillectomy     Family History  Problem Relation Age of Onset  . Colon cancer Mother   . Melanoma Father   . Lymphoma Son    History  Substance Use Topics  . Smoking status: Former Smoker -- 0.20 packs/day for 8 years    Types: Cigarettes    Quit date: 09/22/1948  . Smokeless tobacco: Never Used  . Alcohol Use: No   OB History   Grav Para Term Preterm Abortions TAB SAB Ect Mult Living   4  4        5      Review of Systems  Constitutional: Negative for fever and chills.  HENT: Negative.   Respiratory: Negative.   Cardiovascular: Negative.   Gastrointestinal: Positive for abdominal pain. Negative for nausea and vomiting.  Musculoskeletal: Positive for back pain.  Skin: Negative.   Neurological: Negative.     Allergies  Horse-derived products; Macrodantin; Premarin; and Sulfa antibiotics  Home Medications   Current Outpatient Rx  Name  Route  Sig  Dispense  Refill  . acetaminophen (TYLENOL) 500 MG tablet   Oral   Take 500 mg by mouth every 6 (six) hours as needed for mild pain.         . calcium carbonate (TUMS - DOSED IN MG ELEMENTAL CALCIUM) 500 MG chewable tablet   Oral   Chew 1 tablet by mouth daily.         . calcium citrate (CALCITRATE - DOSED IN MG ELEMENTAL CALCIUM) 950 MG tablet   Oral   Take 1 tablet by mouth daily.         . cholecalciferol (VITAMIN D) 1000 UNITS tablet   Oral   Take 1,000 Units by mouth daily.         Marland Kitchen docusate sodium (COLACE) 100 MG capsule   Oral  Take 100 mg by mouth daily.         . iron polysaccharides (NIFEREX) 150 MG capsule   Oral   Take 1 capsule (150 mg total) by mouth daily.   30 capsule   3   . lansoprazole (PREVACID) 15 MG capsule   Oral   Take 15 mg by mouth as needed.          Marland Kitchen levothyroxine (SYNTHROID, LEVOTHROID) 75 MCG tablet   Oral   Take 75 mcg by mouth daily.         . Multiple Vitamin (MULITIVITAMIN WITH MINERALS) TABS   Oral   Take 1 tablet by mouth daily.         Marland Kitchen oxyCODONE-acetaminophen (PERCOCET/ROXICET) 5-325 MG per tablet   Oral   Take 1-2 tablets by mouth every 6 (six) hours as needed for severe pain.   20 tablet   0   . polyethylene glycol (MIRALAX / GLYCOLAX) packet   Oral   Take 17 g by mouth daily as needed (for constipation).         . Probiotic Product (ALIGN) 4 MG CAPS   Oral   Take 1 capsule by mouth daily.         . solifenacin (VESICARE) 10  MG tablet   Oral   Take 10 mg by mouth daily.         . traMADol (ULTRAM) 50 MG tablet   Oral   Take 50 mg by mouth every 6 (six) hours as needed for moderate pain.           BP 136/92  Pulse 78  Temp(Src) 98.5 F (36.9 C) (Oral)  Resp 16  SpO2 92% Physical Exam  Constitutional: She is oriented to person, place, and time. She appears well-developed and well-nourished. No distress.  HENT:  Head: Normocephalic and atraumatic.  Eyes: Conjunctivae are normal. Pupils are equal, round, and reactive to light.  Neck: Normal range of motion.  Cardiovascular: Normal rate.   No murmur heard. Pulmonary/Chest: Effort normal. She has no wheezes. She has no rales. She exhibits no tenderness.  Abdominal: Soft. She exhibits no mass. There is no tenderness.  Genitourinary:  No CVA tenderness.  Musculoskeletal: Normal range of motion. She exhibits no edema.  Midline lumbar tenderness extending toward coccyx. NO swelling or discoloration. No skin wounds.   Neurological: She is alert and oriented to person, place, and time.    ED Course  Procedures (including critical care time) Labs Review Labs Reviewed  URINALYSIS, ROUTINE W REFLEX MICROSCOPIC - Abnormal; Notable for the following:    Leukocytes, UA MODERATE (*)    All other components within normal limits  URINE CULTURE  URINE MICROSCOPIC-ADD ON  CBC WITH DIFFERENTIAL  BASIC METABOLIC PANEL   Imaging Review Dg Abd Acute W/chest  09/21/2013   CLINICAL DATA:  Constipation.  EXAM: ACUTE ABDOMEN SERIES (ABDOMEN 2 VIEW & CHEST 1 VIEW)  COMPARISON:  CT 06/29/2013  FINDINGS: Nonobstructive bowel gas pattern. No free air. No radiographic evidence for increased stool burden. No organomegaly or suspicious calcification.  Mild hyperinflation of the lungs. Heart is normal size. No confluent acute airspace opacity or effusion. Calcified granulomas in the left lower lobe and likely within the spleen.  IMPRESSION: No evidence of bowel obstruction  or free air.  No acute cardiopulmonary disease.   Electronically Signed   By: Charlett Nose M.D.   On: 09/21/2013 13:00    EKG Interpretation   None  MDM  No diagnosis found. 1. Back pain  The patient gives some history and complains only of back pain. Per the daughter, there is a concern for weakness and continuing pain possibly being connected to an infection. Dr. Fayrene Fearing in to see and evaluate. Discussed plan of care with family and blood/urine tests ordered. If negative, family will be comfortable with discharge home.   Lab studies reassuring. Results discussed with family who is comfortable with discharge and will follow up with PCP for recheck as suggested.  Arnoldo Hooker, PA-C 09/25/13 [phone]

## 2013-09-23 LAB — URINE CULTURE: Colony Count: >=100000

## 2013-09-24 ENCOUNTER — Telehealth (HOSPITAL_COMMUNITY): Payer: Self-pay | Admitting: Emergency Medicine

## 2013-09-24 NOTE — ED Notes (Signed)
Post ED Visit - Positive Culture Follow-up: Successful Patient Follow-Up  Culture assessed and recommendations reviewed by: [x]  Wes Dulaney, Pharm.D., BCPS []  Celedonio MiyamotoJeremy Frens, [address]Pharm[address] BCPS []  Georgina PillionElizabeth Martin, Pharm.D., BCPS []  WaverlyMinh Pham, [address]Pharm[address]D., BCPS, AAHIVP []  Estella HuskMichelle Turner, Pharm.D., BCPS, AAHIVP  Positive urine culture  [x]  Patient discharged without antimicrobial prescription and treatment is now indicated []  Organism is resistant to prescribed ED discharge antimicrobial []  Patient with positive blood cultures  Changes discussed with ED provider: Felicie Mornavid Smith NP New antibiotic prescription: Vantin 100 mg PO BID x 7 days    Zeb ComfortHolland, Christos Mixson 09/24/2013, 6:37 PM

## 2013-09-24 NOTE — Progress Notes (Signed)
ED Antimicrobial Stewardship Positive Culture Follow Up   Caralyn GuileMargery O Boteler is an 80 y.o. female [facility] with a chief complaint of back pain, lethargy, abdominal pain and dysuria.   Chief Complaint  Patient presents with  . Back Pain    Recent Results (from the past 720 hour(s))  URINE CULTURE     Status: None   Collection Time    09/21/13  1:16 PM      Result Value Range Status   Specimen Description URINE, CLEAN CATCH   Final   Special Requests NONE   Final   Culture  Setup Time     Final   Value: 09/21/2013 20:47     Performed at [facility]   Colony Count     Final   Value: >=100,000 COLONIES/ML     [facility]formed at [facility]   Culture     Final   Value: Daria PasturesITROBACTER BRAAKII     Performed at [facility]   Report Status 09/23/2013 FINAL   Final   Organism ID, Bacteria CITROBACTER BRAAKII   Final    []  Treated with , organism resistant to prescribed antimicrobial [x]  Patient discharged originally without antimicrobial agent and treatment is now indicated  New antibiotic prescription: Cefpodoxime 100mg  PO BID x 7 days  ED Provider: Felicie Mornavid Smith, NP-C   Cleon DewDulaney, [place] 09/24/2013, 7:16 PM Infectious Diseases Pharmacist Phone# [phone]

## 2013-09-27 NOTE — ED Notes (Signed)
Son notified of results and he stated  that [facility] and he will call us back to let us know what the decision is.Son stated that UTI is chronic problem and he will make a decision after [facility].

## 2013-10-03 ENCOUNTER — Non-Acute Institutional Stay (SKILLED_NURSING_FACILITY): Payer: Medicare Other | Admitting: Internal Medicine

## 2013-10-03 DIAGNOSIS — D509 Iron deficiency anemia, unspecified: Secondary | ICD-10-CM

## 2013-10-03 DIAGNOSIS — F0390 Unspecified dementia without behavioral disturbance: Secondary | ICD-10-CM

## 2013-10-03 DIAGNOSIS — R42 Dizziness and giddiness: Secondary | ICD-10-CM

## 2013-10-03 DIAGNOSIS — J45909 Unspecified asthma, uncomplicated: Secondary | ICD-10-CM

## 2013-10-03 DIAGNOSIS — E039 Hypothyroidism, unspecified: Secondary | ICD-10-CM

## 2013-10-03 DIAGNOSIS — N390 Urinary tract infection, site not specified: Secondary | ICD-10-CM

## 2013-10-03 DIAGNOSIS — R918 Other nonspecific abnormal finding of lung field: Secondary | ICD-10-CM

## 2013-10-05 ENCOUNTER — Encounter: Payer: Self-pay | Admitting: Internal Medicine

## 2013-10-05 DIAGNOSIS — F0390 Unspecified dementia without behavioral disturbance: Secondary | ICD-10-CM | POA: Insufficient documentation

## 2013-10-05 DIAGNOSIS — E039 Hypothyroidism, unspecified: Secondary | ICD-10-CM | POA: Insufficient documentation

## 2013-10-05 NOTE — Assessment & Plan Note (Signed)
Last urine  C and S came out 09/23/2013 with citrobacter - presume it was treated PTA

## 2013-10-05 NOTE — Assessment & Plan Note (Signed)
Most recent TSH 11/2012 was 2.5; will continue 75mcg daily and order TSH

## 2013-10-05 NOTE — Progress Notes (Signed)
MRN: [id number] Name: RENETTE HSU  Sex: female Age: 80 y.o. DOB: October 11, 1917  [facility] #: [facility] Facility/Room: 115 Level Of Care: SNF Provider: Merrilee Seashore D Emergency Contacts: Extended Emergency Contact Information Primary Emergency Contact: Jonna Coup [place] Home Phone: [phone] Mobile Phone: [phone] Relation: Daughter Secondary Emergency Contact: Venda Rodes [place] Home Phone: [phone] Work Phone: [phone] Mobile Phone: [phone] Relation: Son  Code Status: DNR  Allergies: Horse-derived products; Macrodantin; Premarin; and Sulfa antibiotics  Chief Complaint  Patient presents with  . nursing home admission    HPI: Patient is 80 y.o. female who is being admitted for rehab after a fall, without any fractures.  Past Medical History  Diagnosis Date  . UTI (lower urinary tract infection)   . Asthma   . Thyroid disease   . Diverticulosis   . Anemia   . Carotid stenosis   . Vertigo   . GERD (gastroesophageal reflux disease)   . Postmenopausal   . Atrophic vaginitis   . Osteoporosis   . Incontinence     Past Surgical History  Procedure Laterality Date  . Abdominal hysterectomy  1957  . Cataract extraction      bilat.  . Colonoscopy  09/30/2001  . Left eye surgery      d/t complications from cataract surgery  . Tonsillectomy        Medication List       This list is accurate as of: 10/03/13 11:59 PM.  Always use your most recent med list.               acetaminophen 325 MG tablet  Commonly known as:  TYLENOL  Take 650 mg by mouth every 6 (six) hours as needed. Not to exceed 3000mg  daily     albuterol 108 (90 BASE) MCG/ACT inhaler  Commonly known as:  PROVENTIL HFA;VENTOLIN HFA  Inhale 1 puff into the lungs 2 (two) times daily as needed for wheezing or shortness of breath.     ALIGN 4 MG Caps  Take 1 capsule by mouth daily.     Beclomethasone Dipropionate 80 MCG/ACT Aers   Place 1 puff into the nose 2 (two) times daily as needed.     calcium citrate 950 MG tablet  Commonly known as:  CALCITRATE - dosed in mg elemental calcium  Take 200 mg of elemental calcium by mouth daily.     cholecalciferol 1000 UNITS tablet  Commonly known as:  VITAMIN D  Take 1,000 Units by mouth daily.     docusate sodium 100 MG capsule  Commonly known as:  COLACE  Take 100 mg by mouth daily.     HYDROcodone-acetaminophen 5-325 MG per tablet  Commonly known as:  NORCO/VICODIN  Take 1 tablet by mouth every 4 (four) hours as needed.     iron polysaccharides 150 MG capsule  Commonly known as:  NIFEREX  Take 1 capsule (150 mg total) by mouth daily.     lansoprazole 15 MG capsule  Commonly known as:  PREVACID  Take 15 mg by mouth as needed.     levothyroxine 75 MCG tablet  Commonly known as:  SYNTHROID, LEVOTHROID  Take 75 mcg by mouth daily.     multivitamin with minerals Tabs tablet  Take 1 tablet by mouth daily.     oxyCODONE-acetaminophen 5-325 MG per tablet  Commonly known as:  PERCOCET/ROXICET  Take 1-2 tablets by mouth every 6 (six) hours as needed for severe pain.  polyethylene glycol packet  Commonly known as:  MIRALAX / GLYCOLAX  Take 17 g by mouth daily as needed (for constipation).     TOVIAZ 8 MG Tb24 tablet  Generic drug:  fesoterodine  Take 8 mg by mouth daily.     traMADol 50 MG tablet  Commonly known as:  ULTRAM  Take 50 mg by mouth every 6 (six) hours as needed for moderate pain.        Meds ordered this encounter  Medications  . acetaminophen (TYLENOL) 325 MG tablet    Sig: Take 650 mg by mouth every 6 (six) hours as needed. Not to exceed 3000mg  daily  . albuterol (PROVENTIL HFA;VENTOLIN HFA) 108 (90 BASE) MCG/ACT inhaler    Sig: Inhale 1 puff into the lungs 2 (two) times daily as needed for wheezing or shortness of breath.  . Beclomethasone Dipropionate 80 MCG/ACT AERS    Sig: Place 1 puff into the nose 2 (two) times daily as needed.   . calcium citrate (CALCITRATE - DOSED IN MG ELEMENTAL CALCIUM) 950 MG tablet    Sig: Take 200 mg of elemental calcium by mouth daily.  . fesoterodine (TOVIAZ) 8 MG TB24 tablet    Sig: Take 8 mg by mouth daily.    Immunization History  Administered Date(s) Administered  . Influenza Whole 05/24/2011    History  Substance Use Topics  . Smoking status: Former Smoker -- 0.20 packs/day for 8 years    Types: Cigarettes    Quit date: 09/22/1948  . Smokeless tobacco: Never Used  . Alcohol Use: No    Family history is noncontributory    Review of Systems  DATA OBTAINED: from patient GENERAL: Feels well no fevers, fatigue, appetite changes SKIN: No itching, rash or wounds EYES: No eye pain, redness, discharge EARS: No earache, tinnitus, change in hearing NOSE: No congestion, drainage or bleeding  MOUTH/THROAT: No mouth or tooth pain, No sore throat, No difficulty chewing or swallowing  RESPIRATORY: No cough, wheezing, SOB CARDIAC: No chest pain, palpitations, lower extremity edema  GI: No abdominal pain, No N/V/D or constipation, No heartburn or reflux  GU: No dysuria, frequency or urgency, or incontinence  MUSCULOSKELETAL: No UNrelieved bone/joint pain NEUROLOGIC: No headache, dizziness or focal weakness PSYCHIATRIC: No overt anxiety or sadness. Sleeps well. No behavior issue.   Filed Vitals:   10/05/13 1231  BP: 114/71  Pulse: 84  Temp: 97.6 F (36.4 C)  Resp: 20    Physical Exam  GENERAL APPEARANCE: Alert, conversant. Appropriately groomed. No acute distress.  SKIN: No diaphoresis rash, or wounds HEAD: Normocephalic, atraumatic  EYES: Conjunctiva/lids clear. Pupils round, reactive. EOMs intact.  EARS: External exam WNL, canals clear. Hearing grossly normal.  NOSE: No deformity or discharge.  MOUTH/THROAT: Lips w/o lesions.  RESPIRATORY: Breathing is even, unlabored. Lung sounds are clear   CARDIOVASCULAR: Heart RRR no murmurs, rubs or gallops. No peripheral edema.    VENOUS: No varicosities. No venous stasis skin changes  GASTROINTESTINAL: Abdomen is soft, non-tender, not distended w/ normal bowel sounds. GENITOURINARY: Bladder non tender, not distended  MUSCULOSKELETAL: No abnormal joints or musculature NEUROLOGIC: Oriented X3. Cranial nerves 2-12 grossly intact. Moves all extremities no tremor. PSYCHIATRIC: Mood and affect appropriate to situation, mild demntia, no behavioral issues  Patient Active Problem List   Diagnosis Date Noted  . Unspecified hypothyroidism 10/05/2013  . Dementia without behavioral disturbance 10/05/2013  . Asthma, chronic 02/17/2013  . Postural dizziness 02/17/2013  . Pulmonary infiltrates 11/24/2011  . Chronic UTI 11/24/2011  .  ANGIODYSPLASIA-INTESTINE 06/25/2010  . ANEMIA, IRON DEFICIENCY 05/07/2010  . FECAL OCCULT BLOOD 05/07/2010    CBC    Component Value Date/Time   WBC 9.1 09/21/2013 1500   RBC 3.78* 09/21/2013 1500   RBC 2.96* 02/17/2013 1440   HGB 12.1 09/21/2013 1500   HCT 35.5* 09/21/2013 1500   PLT 326 09/21/2013 1500   MCV 93.9 09/21/2013 1500   LYMPHSABS 0.8 09/21/2013 1500   MONOABS 0.9 09/21/2013 1500   EOSABS 0.1 09/21/2013 1500   BASOSABS 0.0 09/21/2013 1500    CMP     Component Value Date/Time   NA 133* 09/21/2013 1500   K 4.2 09/21/2013 1500   CL 93* 09/21/2013 1500   CO2 29 09/21/2013 1500   GLUCOSE 118* 09/21/2013 1500   BUN 12 09/21/2013 1500   CREATININE 0.66 09/21/2013 1500   CREATININE 0.88 06/28/2013 1349   CALCIUM 10.5 09/21/2013 1500   PROT 5.6* 11/26/2011 0430   ALBUMIN 2.1* 11/26/2011 0430   AST 42* 11/26/2011 0430   ALT 51* 11/26/2011 0430   ALKPHOS 152* 11/26/2011 0430   BILITOT 0.4 11/26/2011 0430   GFRNONAA 74* 09/21/2013 1500   GFRAA 86* 09/21/2013 1500    Assessment and Plan  ANEMIA, IRON DEFICIENCY Most recent H/H 12.2/35.5 -will continue iron  Postural dizziness With falls-seen in ED for same 12/24 and 12/31 - no fractures  Chronic UTI Last urine  C and S came  out 09/23/2013 with citrobacter - presume it was treated PTA  Asthma, chronic Continue inhalers  Unspecified hypothyroidism Most recent TSH 11/2012 was 2.5; will continue 75mcg daily and order TSH  Pulmonary infiltrates  A year ago had infiltrates that cleared with prednisone, felt to be macrobid reaction or BOOP; will keep this in mind if any recurrence.  Dementia without behavioral disturbance Mild - some forgetfulness    Margit HanksALEXANDER, Mitchel Delduca D, MD

## 2013-10-05 NOTE — Assessment & Plan Note (Signed)
Mild - some forgetfulness

## 2013-10-05 NOTE — Assessment & Plan Note (Signed)
Continue inhalers

## 2013-10-05 NOTE — Assessment & Plan Note (Signed)
Most recent H/H 12.2/35.5 -will continue iron

## 2013-10-05 NOTE — Assessment & Plan Note (Signed)
With falls-seen in ED for same 12/24 and 12/31 - no fractures

## 2013-10-05 NOTE — ED Provider Notes (Signed)
Medical screening examination/treatment/procedure(s) were conducted as a shared visit with non-physician practitioner(s) and myself.  I personally evaluated the patient during the encounter.  EKG Interpretation   None       Patient seen and evaluated.  Benign exam.  Neurological intact.  Afebrile.  There is just muscles in the family.  They're comfortable for plan as outlined above.  Rolland PorterMark Rishik Tubby, MD 10/05/13 [phone]

## 2013-10-05 NOTE — Assessment & Plan Note (Signed)
A year ago had infiltrates that cleared with prednisone, felt to be macrobid reaction or BOOP; will keep this in mind if any recurrence.

## 2013-10-18 ENCOUNTER — Non-Acute Institutional Stay (SKILLED_NURSING_FACILITY): Payer: Medicare Other | Admitting: Nurse Practitioner

## 2013-10-18 DIAGNOSIS — J45909 Unspecified asthma, uncomplicated: Secondary | ICD-10-CM

## 2013-10-18 DIAGNOSIS — R05 Cough: Secondary | ICD-10-CM

## 2013-10-18 DIAGNOSIS — R059 Cough, unspecified: Secondary | ICD-10-CM

## 2013-10-18 DIAGNOSIS — K219 Gastro-esophageal reflux disease without esophagitis: Secondary | ICD-10-CM

## 2013-10-18 NOTE — Progress Notes (Signed)
Patient ID: Teresa GuileMargery O Prajapati, female   DOB: 1918/07/13, 80 y.o.   MRN: [id number]    Nursing Home Location[facility]  [facility]   Place of Service: SNF (31)  PCP: Kari BaarsSHAW,W DOUGLAS, MD  Allergies  Allergen Reactions  . Horse-Derived Products Other (See Comments)    Loss of appetite  . Macrodantin   . Premarin [Estrogens, Conjugated]   . Sulfa Antibiotics Other (See Comments)    Loss of appetite     Chief Complaint  Patient presents with  . Acute Visit    HPI:  Patient is 80 y.o. female who is being admitted for rehab after a fall, without any fractures who is being seen today for cough; pt has been using her albuterol inhaler and nursing requested an assessment; pt reports cough has actually improved today and she was using inhaler to break up some congestion which appears to be better; pt with long time history of GERD and conts to have some indigestion at times.  Pt does not have any shortness of breath, chest pains, palpitations, fevers or chills     Review of Systems:  Review of Systems  Constitutional: Negative for fever and chills.  Respiratory: Positive for cough (which has improved). Negative for sputum production, shortness of breath and wheezing.   Cardiovascular: Negative for chest pain, palpitations and leg swelling.  Gastrointestinal: Negative for abdominal pain, diarrhea and constipation.  Genitourinary: Negative for dysuria.  Skin: Negative.   Neurological: Negative for headaches.     Past Medical History  Diagnosis Date  . UTI (lower urinary tract infection)   . Asthma   . Thyroid disease   . Diverticulosis   . Anemia   . Carotid stenosis   . Vertigo   . GERD (gastroesophageal reflux disease)   . Postmenopausal   . Atrophic vaginitis   . Osteoporosis   . Incontinence    Past Surgical History  Procedure Laterality Date  . Abdominal hysterectomy  1957  . Cataract extraction      bilat.  . Colonoscopy  09/30/2001  . Left eye surgery      d/t  complications from cataract surgery  . Tonsillectomy     Social History:   reports that she quit smoking about 65 years ago. Her smoking use included Cigarettes. She has a 1.6 pack-year smoking history. She has never used smokeless tobacco. She reports that she does not drink alcohol or use illicit drugs.  Family History  Problem Relation Age of Onset  . Colon cancer Mother   . Melanoma Father   . Lymphoma Son     Medications: Patient's Medications  New Prescriptions   No medications on file  Previous Medications   ACETAMINOPHEN (TYLENOL) 325 MG TABLET    Take 650 mg by mouth every 6 (six) hours as needed. Not to exceed 3000mg  daily   ALBUTEROL (PROVENTIL HFA;VENTOLIN HFA) 108 (90 BASE) MCG/ACT INHALER    Inhale 1 puff into the lungs 2 (two) times daily as needed for wheezing or shortness of breath.   BECLOMETHASONE DIPROPIONATE 80 MCG/ACT AERS    Place 1 puff into the nose 2 (two) times daily as needed.   CALCIUM CITRATE (CALCITRATE - DOSED IN MG ELEMENTAL CALCIUM) 950 MG TABLET    Take 200 mg of elemental calcium by mouth daily.   CHOLECALCIFEROL (VITAMIN D) 1000 UNITS TABLET    Take 1,000 Units by mouth daily.   DOCUSATE SODIUM (COLACE) 100 MG CAPSULE    Take 100 mg  by mouth daily.   FESOTERODINE (TOVIAZ) 8 MG TB24 TABLET    Take 8 mg by mouth daily.   HYDROCODONE-ACETAMINOPHEN (NORCO/VICODIN) 5-325 MG PER TABLET    Take 1 tablet by mouth every 4 (four) hours as needed.   IRON POLYSACCHARIDES (NIFEREX) 150 MG CAPSULE    Take 1 capsule (150 mg total) by mouth daily.   LANSOPRAZOLE (PREVACID) 15 MG CAPSULE    Take 15 mg by mouth as needed.    LEVOTHYROXINE (SYNTHROID, LEVOTHROID) 75 MCG TABLET    Take 75 mcg by mouth daily.   MULTIPLE VITAMIN (MULITIVITAMIN WITH MINERALS) TABS    Take 1 tablet by mouth daily.   OXYCODONE-ACETAMINOPHEN (PERCOCET/ROXICET) 5-325 MG PER TABLET    Take 1-2 tablets by mouth every 6 (six) hours as needed for severe pain.   POLYETHYLENE GLYCOL (MIRALAX /  GLYCOLAX) PACKET    Take 17 g by mouth daily as needed (for constipation).   PROBIOTIC PRODUCT (ALIGN) 4 MG CAPS    Take 1 capsule by mouth daily.   TRAMADOL (ULTRAM) 50 MG TABLET    Take 50 mg by mouth every 6 (six) hours as needed for moderate pain.   Modified Medications   No medications on file  Discontinued Medications   No medications on file     Physical Exam: Physical Exam  Vitals reviewed. Constitutional: She is well-developed, well-nourished, and in no distress.  HENT:  Head: Normocephalic and atraumatic.  Mouth/Throat: Oropharynx is clear and moist. No oropharyngeal exudate.  Eyes: Conjunctivae and EOM are normal. Pupils are equal, round, and reactive to light.  Neck: Normal range of motion. Neck supple.  Cardiovascular: Normal rate, regular rhythm and normal heart sounds.   Pulmonary/Chest: Effort normal and breath sounds normal. No respiratory distress. She has no wheezes.  Abdominal: Soft. Bowel sounds are normal. She exhibits no distension. There is no tenderness.  Neurological: She is alert.  Skin: Skin is warm and dry.  Psychiatric: Affect normal.    Filed Vitals:   10/18/13 1526  BP: 140/72  Pulse: 70  Temp: 97.8 F (36.6 C)  Resp: 18  SpO2: 96%      Labs reviewed: Basic Metabolic Panel:  Recent Labs  16/10/96 0545 06/28/13 1349 09/21/13 1500  NA 137 136 133*  K 4.1 4.8 4.2  CL 107 99 93*  CO2 23 30 29   GLUCOSE 82 71 118*  BUN 8 20 12   CREATININE 0.63 0.88 0.66  CALCIUM 9.1 9.9 10.5   Liver Function Tests: No results found for this basename: AST, ALT, ALKPHOS, BILITOT, PROT, ALBUMIN,  in the last 8760 hours No results found for this basename: LIPASE, AMYLASE,  in the last 8760 hours No results found for this basename: AMMONIA,  in the last 8760 hours CBC:  Recent Labs  02/19/13 0545 03/28/13 1142 09/21/13 1500  WBC 7.7 5.3 9.1  NEUTROABS  --  2.9 7.3  HGB 9.7* 12.3 12.1  HCT 31.6* 37.3 35.5*  MCV 72.3* 85.3 93.9  PLT 322 234.0  326    Assessment/Plan 1. Asthma, chronic - no increase in shortness of breath and pt reports cough symptoms have improved; will cont PRN albuterol and QVAR as needed   2. Cough - with congestion; lungs clear; will start mucinex DM BID for 1 week to also help  3. GERD (gastroesophageal reflux disease) -chronic; will stop prevacid and start protonix to see if this helps with symptoms and cough

## 2013-11-07 ENCOUNTER — Non-Acute Institutional Stay (SKILLED_NURSING_FACILITY): Payer: Medicare Other | Admitting: Internal Medicine

## 2013-11-07 ENCOUNTER — Encounter: Payer: Self-pay | Admitting: Internal Medicine

## 2013-11-07 DIAGNOSIS — R42 Dizziness and giddiness: Secondary | ICD-10-CM

## 2013-11-07 DIAGNOSIS — F0390 Unspecified dementia without behavioral disturbance: Secondary | ICD-10-CM

## 2013-11-07 DIAGNOSIS — J45909 Unspecified asthma, uncomplicated: Secondary | ICD-10-CM

## 2013-11-07 DIAGNOSIS — D509 Iron deficiency anemia, unspecified: Secondary | ICD-10-CM

## 2013-11-07 DIAGNOSIS — E039 Hypothyroidism, unspecified: Secondary | ICD-10-CM

## 2013-11-07 NOTE — Progress Notes (Signed)
MRN: [id number] Name: Teresa Callahan  Sex: female Age: 80 y.o. DOB: [birth year]  [facility] Facility/[facility]: 115 Level Of Care: SNF Provider: Merrilee SeashoreALEXANDER, Roniel Halloran D Emergency Contacts: Extended Emergency Contact Information Primary Emergency Contact: Jonna Couphomas,Melissa  [place] Home Phone: [phone] Mobile Phone: [phone] Relation: Daughter Secondary Emergency Contact: Venda RodesLane,Clark  [place] Home Phone: [phone] Work Phone: [phone] Mobile Phone: [phone] Relation: Son  Code Status: DNR  Allergies: Horse-derived products; Macrodantin; Premarin; and Sulfa antibiotics  Chief Complaint  Patient presents with  . Discharge Note    HPI: Patient is 80 y.o. female who is being discharged to home.  Past Medical History  Diagnosis Date  . UTI (lower urinary tract infection)   . Asthma   . Thyroid disease   . Diverticulosis   . Anemia   . Carotid stenosis   . Vertigo   . GERD (gastroesophageal reflux disease)   . Postmenopausal   . Atrophic vaginitis   . Osteoporosis   . Incontinence     Past Surgical History  Procedure Laterality Date  . Abdominal hysterectomy  1957  . Cataract extraction      bilat.  . Colonoscopy  09/30/2001  . Left eye surgery      d/t complications from cataract surgery  . Tonsillectomy        Medication List       This list is accurate as of: 11/07/13 11:26 AM.  Always use your most recent med list.               acetaminophen 325 MG tablet  Commonly known as:  TYLENOL  Take 650 mg by mouth every 6 (six) hours as needed. Not to exceed 3000mg  daily     albuterol 108 (90 BASE) MCG/ACT inhaler  Commonly known as:  PROVENTIL HFA;VENTOLIN HFA  Inhale 1 puff into the lungs 2 (two) times daily as needed for wheezing or shortness of breath.     ALIGN 4 MG Caps  Take 1 capsule by mouth daily.     Beclomethasone Dipropionate 80 MCG/ACT Aers  Place 1 puff into the nose 2 (two) times daily  as needed.     calcium citrate 950 MG tablet  Commonly known as:  CALCITRATE - dosed in mg elemental calcium  Take 200 mg of elemental calcium by mouth daily.     HYDROcodone-acetaminophen 5-325 MG per tablet  Commonly known as:  NORCO/VICODIN  Take 1 tablet by mouth every 4 (four) hours as needed.     levothyroxine 75 MCG tablet  Commonly known as:  SYNTHROID, LEVOTHROID  Take 75 mcg by mouth daily.     magnesium citrate Soln  Take 1 Bottle by mouth daily as needed for severe constipation.     multivitamin with minerals Tabs tablet  Take 1 tablet by mouth daily.     oxyCODONE-acetaminophen 5-325 MG per tablet  Commonly known as:  PERCOCET/ROXICET  Take 1-2 tablets by mouth every 6 (six) hours as needed for severe pain.     pantoprazole 40 MG tablet  Commonly known as:  PROTONIX  Take 40 mg by mouth daily.     polyethylene glycol packet  Commonly known as:  MIRALAX / GLYCOLAX  Take 17 g by mouth daily as needed (for constipation).     TOVIAZ 8 MG Tb24 tablet  Generic drug:  fesoterodine  Take 8 mg by mouth daily.     traMADol 50 MG tablet  Commonly known as:  Janean SarkULTRAM  Take 50 mg by mouth every 6 (six) hours as needed for moderate pain.        Meds ordered this encounter  Medications  . pantoprazole (PROTONIX) 40 MG tablet    Sig: Take 40 mg by mouth daily.  . magnesium citrate SOLN    Sig: Take 1 Bottle by mouth daily as needed for severe constipation.    Immunization History  Administered Date(s) Administered  . Influenza Whole 05/24/2011    History  Substance Use Topics  . Smoking status: Former Smoker -- 0.20 packs/day for 8 years    Types: Cigarettes    Quit date: 09/22/1948  . Smokeless tobacco: Never Used  . Alcohol Use: No    Filed Vitals:   11/07/13 1107  BP: 103/51  Pulse: 58  Temp: 97.4 F (36.3 C)  Resp: 19    Physical Exam  GENERAL APPEARANCE: Alert, conversant. Appropriately groomed. No acute distress.  HEENT:  Unremarkable. RESPIRATORY: Breathing is even, unlabored. Lung sounds are clear   CARDIOVASCULAR: Heart RRR no murmurs, rubs or gallops. No peripheral edema.  GASTROINTESTINAL: Abdomen is soft, non-tender, not distended w/ normal bowel sounds.  NEUROLOGIC: Cranial nerves 2-12 grossly intact. Moves all extremities no tremor.  Patient Active Problem List   Diagnosis Date Noted  . Unspecified hypothyroidism 10/05/2013  . Dementia without behavioral disturbance 10/05/2013  . Asthma, chronic 02/17/2013  . Postural dizziness 02/17/2013  . Pulmonary infiltrates 11/24/2011  . Chronic UTI 11/24/2011  . ANGIODYSPLASIA-INTESTINE 06/25/2010  . ANEMIA, IRON DEFICIENCY 05/07/2010  . FECAL OCCULT BLOOD 05/07/2010    CBC    Component Value Date/Time   WBC 9.1 09/21/2013 1500   RBC 3.78* 09/21/2013 1500   RBC 2.96* 02/17/2013 1440   HGB 12.1 09/21/2013 1500   HCT 35.5* 09/21/2013 1500   PLT 326 09/21/2013 1500   MCV 93.9 09/21/2013 1500   LYMPHSABS 0.8 09/21/2013 1500   MONOABS 0.9 09/21/2013 1500   EOSABS 0.1 09/21/2013 1500   BASOSABS 0.0 09/21/2013 1500    CMP     Component Value Date/Time   NA 133* 09/21/2013 1500   K 4.2 09/21/2013 1500   CL 93* 09/21/2013 1500   CO2 29 09/21/2013 1500   GLUCOSE 118* 09/21/2013 1500   BUN 12 09/21/2013 1500   CREATININE 0.66 09/21/2013 1500   CREATININE 0.88 06/28/2013 1349   CALCIUM 10.5 09/21/2013 1500   PROT 5.6* 11/26/2011 0430   ALBUMIN 2.1* 11/26/2011 0430   AST 42* 11/26/2011 0430   ALT 51* 11/26/2011 0430   ALKPHOS 152* 11/26/2011 0430   BILITOT 0.4 11/26/2011 0430   GFRNONAA 74* 09/21/2013 1500   GFRAA 86* 09/21/2013 1500    Assessment and Plan  Patient is in stable condition and is being discharged to home with her family with HH, OT/PT.  Margit Hanks, MD

## 2014-05-09 ENCOUNTER — Inpatient Hospital Stay (HOSPITAL_COMMUNITY): Payer: Medicare Other

## 2014-05-09 ENCOUNTER — Encounter: Payer: Self-pay | Admitting: Endocrinology

## 2014-05-09 ENCOUNTER — Inpatient Hospital Stay (HOSPITAL_COMMUNITY)
Admission: EM | Admit: 2014-05-09 | Discharge: 2014-05-12 | DRG: 689 | Disposition: A | Discharge status: Skilled Nursing Facility | Payer: Medicare Other | Source: Ambulatory Visit | Attending: Internal Medicine | Admitting: Internal Medicine

## 2014-05-09 ENCOUNTER — Encounter (HOSPITAL_COMMUNITY): Payer: Self-pay

## 2014-05-09 DIAGNOSIS — D509 Iron deficiency anemia, unspecified: Secondary | ICD-10-CM | POA: Diagnosis present

## 2014-05-09 DIAGNOSIS — M810 Age-related osteoporosis without current pathological fracture: Secondary | ICD-10-CM | POA: Diagnosis present

## 2014-05-09 DIAGNOSIS — N390 Urinary tract infection, site not specified: Secondary | ICD-10-CM | POA: Diagnosis present

## 2014-05-09 DIAGNOSIS — M899 Disorder of bone, unspecified: Secondary | ICD-10-CM | POA: Diagnosis present

## 2014-05-09 DIAGNOSIS — I6529 Occlusion and stenosis of unspecified carotid artery: Secondary | ICD-10-CM | POA: Diagnosis present

## 2014-05-09 DIAGNOSIS — G9341 Metabolic encephalopathy: Secondary | ICD-10-CM | POA: Diagnosis present

## 2014-05-09 DIAGNOSIS — Z7982 Long term (current) use of aspirin: Secondary | ICD-10-CM

## 2014-05-09 DIAGNOSIS — N179 Acute kidney failure, unspecified: Secondary | ICD-10-CM | POA: Diagnosis present

## 2014-05-09 DIAGNOSIS — R5381 Other malaise: Secondary | ICD-10-CM | POA: Diagnosis present

## 2014-05-09 DIAGNOSIS — Z882 Allergy status to sulfonamides status: Secondary | ICD-10-CM

## 2014-05-09 DIAGNOSIS — E039 Hypothyroidism, unspecified: Secondary | ICD-10-CM | POA: Diagnosis present

## 2014-05-09 DIAGNOSIS — E871 Hypo-osmolality and hyponatremia: Secondary | ICD-10-CM | POA: Diagnosis present

## 2014-05-09 DIAGNOSIS — R4182 Altered mental status, unspecified: Secondary | ICD-10-CM | POA: Insufficient documentation

## 2014-05-09 DIAGNOSIS — Z800 Family history of malignant neoplasm of digestive organs: Secondary | ICD-10-CM

## 2014-05-09 DIAGNOSIS — Z881 Allergy status to other antibiotic agents status: Secondary | ICD-10-CM | POA: Diagnosis not present

## 2014-05-09 DIAGNOSIS — Z807 Family history of other malignant neoplasms of lymphoid, hematopoietic and related tissues: Secondary | ICD-10-CM | POA: Diagnosis not present

## 2014-05-09 DIAGNOSIS — A419 Sepsis, unspecified organism: Secondary | ICD-10-CM | POA: Diagnosis present

## 2014-05-09 DIAGNOSIS — R5383 Other fatigue: Secondary | ICD-10-CM | POA: Diagnosis present

## 2014-05-09 DIAGNOSIS — M949 Disorder of cartilage, unspecified: Secondary | ICD-10-CM

## 2014-05-09 DIAGNOSIS — Z888 Allergy status to other drugs, medicaments and biological substances status: Secondary | ICD-10-CM

## 2014-05-09 DIAGNOSIS — R4701 Aphasia: Secondary | ICD-10-CM | POA: Diagnosis present

## 2014-05-09 DIAGNOSIS — Z808 Family history of malignant neoplasm of other organs or systems: Secondary | ICD-10-CM | POA: Diagnosis not present

## 2014-05-09 DIAGNOSIS — K219 Gastro-esophageal reflux disease without esophagitis: Secondary | ICD-10-CM | POA: Diagnosis present

## 2014-05-09 DIAGNOSIS — R410 Disorientation, unspecified: Secondary | ICD-10-CM | POA: Insufficient documentation

## 2014-05-09 DIAGNOSIS — F0390 Unspecified dementia without behavioral disturbance: Secondary | ICD-10-CM | POA: Diagnosis present

## 2014-05-09 HISTORY — DX: Hypothyroidism, unspecified: E03.9

## 2014-05-09 HISTORY — DX: Personal history of other medical treatment: Z92.89

## 2014-05-09 HISTORY — DX: Pneumonia, unspecified organism: J18.9

## 2014-05-09 LAB — COMPREHENSIVE METABOLIC PANEL
ALBUMIN: 2.8 g/dL — AB (ref 3.5–5.2)
ALT: 16 U/L (ref 0–35)
ANION GAP: 12 (ref 5–15)
AST: 29 U/L (ref 0–37)
Alkaline Phosphatase: 98 U/L (ref 39–117)
BUN: 21 mg/dL (ref 6–23)
CALCIUM: 9.5 mg/dL (ref 8.4–10.5)
CO2: 26 mEq/L (ref 19–32)
Chloride: 88 mEq/L — ABNORMAL LOW (ref 96–112)
Creatinine, Ser: 0.78 mg/dL (ref 0.50–1.10)
GFR calc non Af Amer: 70 mL/min — ABNORMAL LOW (ref >90)
GFR, EST AFRICAN AMERICAN: 81 mL/min — AB (ref >90)
GLUCOSE: 125 mg/dL — AB (ref 70–99)
Potassium: 3.9 mEq/L (ref 3.7–5.3)
Sodium: 126 mEq/L — ABNORMAL LOW (ref 137–147)
TOTAL PROTEIN: 6.6 g/dL (ref 6.0–8.3)
Total Bilirubin: 0.5 mg/dL (ref 0.3–1.2)

## 2014-05-09 LAB — CBC WITH DIFFERENTIAL/PLATELET
Basophils Absolute: 0 10*3/uL (ref 0.0–0.1)
Basophils Relative: 0 % (ref 0–1)
Eosinophils Absolute: 0 10*3/uL (ref 0.0–0.7)
Eosinophils Relative: 0 % (ref 0–5)
HEMATOCRIT: 35.9 % — AB (ref 36.0–46.0)
HEMOGLOBIN: 12.6 g/dL (ref 12.0–15.0)
LYMPHS ABS: 0.7 10*3/uL (ref 0.7–4.0)
Lymphocytes Relative: 7 % — ABNORMAL LOW (ref 12–46)
MCH: 31.6 pg (ref 26.0–34.0)
MCHC: 35.1 g/dL (ref 30.0–36.0)
MCV: 90 fL (ref 78.0–100.0)
MONOS PCT: 8 % (ref 3–12)
Monocytes Absolute: 0.8 10*3/uL (ref 0.1–1.0)
NEUTROS ABS: 8.8 10*3/uL — AB (ref 1.7–7.7)
NEUTROS PCT: 85 % — AB (ref 43–77)
Platelets: 172 10*3/uL (ref 150–400)
RBC: 3.99 MIL/uL (ref 3.87–5.11)
RDW: 15.5 % (ref 11.5–15.5)
WBC: 10.4 10*3/uL (ref 4.0–10.5)

## 2014-05-09 LAB — TSH: TSH: 2.36 u[IU]/mL (ref 0.350–4.500)

## 2014-05-09 MED ORDER — PANTOPRAZOLE SODIUM 40 MG PO TBEC
40.0000 mg | DELAYED_RELEASE_TABLET | Freq: Every day | ORAL | Status: DC
  Administered 2014-05-11 – 2014-05-12 (×2): 40 mg via ORAL
  Filled 2014-05-09 (×3): qty 1

## 2014-05-09 MED ORDER — ACETAMINOPHEN 325 MG PO TABS: 650.0000 mg | ORAL_TABLET | Freq: Four times a day (QID) | ORAL | Status: DC | PRN

## 2014-05-09 MED ORDER — SACCHAROMYCES BOULARDII 250 MG PO CAPS
250.0000 mg | ORAL_CAPSULE | Freq: Two times a day (BID) | ORAL | Status: DC
  Administered 2014-05-09 – 2014-05-12 (×5): 250 mg via ORAL
  Filled 2014-05-09 (×7): qty 1

## 2014-05-09 MED ORDER — ONDANSETRON HCL 4 MG PO TABS: 4.0000 mg | ORAL_TABLET | Freq: Four times a day (QID) | ORAL | Status: DC | PRN

## 2014-05-09 MED ORDER — LEVOTHYROXINE SODIUM 75 MCG PO TABS
75.0000 ug | ORAL_TABLET | Freq: Every day | ORAL | Status: DC
  Administered 2014-05-10 – 2014-05-12 (×3): 75 ug via ORAL
  Filled 2014-05-09 (×5): qty 1

## 2014-05-09 MED ORDER — TRAMADOL HCL 50 MG PO TABS: 50.0000 mg | ORAL_TABLET | Freq: Four times a day (QID) | ORAL | Status: DC | PRN

## 2014-05-09 MED ORDER — DEXTROSE 5 % IV SOLN
1.0000 g | INTRAVENOUS | Status: DC
  Administered 2014-05-09 – 2014-05-11 (×3): 1 g via INTRAVENOUS
  Filled 2014-05-09 (×4): qty 10

## 2014-05-09 MED ORDER — ONDANSETRON HCL 4 MG/2ML IJ SOLN: 4.0000 mg | Freq: Four times a day (QID) | INTRAMUSCULAR | Status: DC | PRN

## 2014-05-09 MED ORDER — ALIGN 4 MG PO CAPS: 1.0000 | ORAL_CAPSULE | Freq: Every day | ORAL | Status: DC

## 2014-05-09 MED ORDER — ENOXAPARIN SODIUM 30 MG/0.3ML ~~LOC~~ SOLN
30.0000 mg | SUBCUTANEOUS | Status: DC
  Administered 2014-05-09 – 2014-05-11 (×3): 30 mg via SUBCUTANEOUS
  Filled 2014-05-09 (×4): qty 0.3

## 2014-05-09 MED ORDER — ALBUTEROL SULFATE HFA 108 (90 BASE) MCG/ACT IN AERS: 1.0000 | INHALATION_SPRAY | Freq: Two times a day (BID) | RESPIRATORY_TRACT | Status: DC | PRN

## 2014-05-09 MED ORDER — ADULT MULTIVITAMIN W/MINERALS CH
1.0000 | ORAL_TABLET | Freq: Every day | ORAL | Status: DC
  Administered 2014-05-11 – 2014-05-12 (×2): 1 via ORAL
  Filled 2014-05-09 (×4): qty 1

## 2014-05-09 MED ORDER — ALBUTEROL SULFATE (2.5 MG/3ML) 0.083% IN NEBU: 2.5000 mg | INHALATION_SOLUTION | Freq: Two times a day (BID) | RESPIRATORY_TRACT | Status: DC | PRN

## 2014-05-09 MED ORDER — SODIUM CHLORIDE 0.9 % IJ SOLN
3.0000 mL | Freq: Two times a day (BID) | INTRAMUSCULAR | Status: DC
  Administered 2014-05-10 – 2014-05-11 (×2): 3 mL via INTRAVENOUS

## 2014-05-09 MED ORDER — SODIUM CHLORIDE 0.9 % IV SOLN
INTRAVENOUS | Status: DC
  Administered 2014-05-10 – 2014-05-12 (×4): via INTRAVENOUS

## 2014-05-09 MED ORDER — ACETAMINOPHEN 650 MG RE SUPP: 650.0000 mg | Freq: Four times a day (QID) | RECTAL | Status: DC | PRN

## 2014-05-09 NOTE — H&P (Signed)
PCP:   Marton Redwood, MD   Chief Complaint:  Weakness and confusion  HPI: 80 YO WF seen in the office with confusion and UTI with dehydration. She met criteria for ARF. She was offered admission but her daughter wanted to try and care for her in the home setting. Late this afternoon, she called and requested that she be admitted due to persistant and progressive weakness She is alone in the room and cannot tell me much. She denies pain. She can't tell me much else   FROM YESTERDAYS NOTE "History of Present Illness  History from: patient Reason for visit: See chief complaint Chief Complaint: pt started having symtpons on friday, she has no balance and is experiencing weakness and confusion  History of Present Illness: Daughter is concerned she has been more lethargic, weak, confused.  Was doing well on Friday afternoon.  On Saturday she developed worsening balance, speech issues (using the wrong words, some garbling, not finishing sentences), confusion.  No specific complaints- no dysuria, pain complaints.  Was eating well until this weekend.  Decreased urine output.   aImpression & Recommendations:  Problem # 1:  Delirium (ICD-780.09) (ICD10-R41.0) Her history is most consistent with acute delirium secondary to an underlying infection.  Lab work today reveals a elevated white blood count of 20, elevated BUN/creatinine of 34/1.7 respectively, consistent with volume depletion.  No focal symptoms other than speech changes to indicate a possible stroke.  We discussed options including admission for hydration, IV antibiotics, and CVA workup.  Her daughter is very reluctant to do this as she has difficulty being in the hospital.  She would prefer to try to manage her as an outpatient.  Treat with Cipro and push fluids.  Reassess in 48 hours with repeat BMET and CBC.  Advised her to take her to the emergency department immediately if she has any focal neurologic changes.  Add aspirin 81 mg  daily.  Problem # 2:  URINARY TRACT INFECTION, ACUTE, RECURRENT (ICD-599.0) (ICD10-N39.0) History of recurrent UTIs in the past.  We'll treat with Cipro pending urine culture data.  Avoid Macrodantin due to prior pulmonary sensitivity reaction. Her updated medication list for this problem includes:    Ciprofloxacin Hcl 250 Mg Oral Tabs (Ciprofloxacin hcl) .Marland Kitchen... 1 pill twice a day x 7 days    Vesicare 10 Mg Oral Tabs (Solifenacin succinate) ..... One po every day   Problem # 3:  Acute renal failure (ICD-584.9) (ICD10-N17.9) Significant decrease in BUN/creatinine is likely secondary to volume depletion in the setting of UTI.  Push fluids- at least 80 ounces of water/Gatorade/Pedialyte per day.  Recheck BMET in 2 days. nd confusion"     Review of Systems:  Review of Systems - unobtainable Past Medical History: Past Medical History  Diagnosis Date  . UTI (lower urinary tract infection)   . Asthma   . Thyroid disease   . Diverticulosis   . Anemia   . Carotid stenosis   . Vertigo   . GERD (gastroesophageal reflux disease)   . Postmenopausal   . Atrophic vaginitis   . Osteoporosis   . Incontinence   . Pneumonia     "probably" (05/09/2014)  . Hypothyroidism   . History of blood transfusion 2014    "not related to a surgery"   Past Medical History: Asthma hypothyroidism Osteopenia- declines follow-up DXA carotid stenosis- 100% right, no significant blockage in left (7/11) urge incontinence chronic vertigo Iron deficiency anemia- colonic AVMs (2003, 2006)-->admission for IV iron Recurrent UTIs  Past Surgical History  Procedure Laterality Date  . Abdominal hysterectomy  1957  . Cataract extraction w/ intraocular lens  implant, bilateral Bilateral   . Colonoscopy  09/30/2001  . Tonsillectomy    . Eye surgery Left     d/t complications from cataract surgery    Medications: Prior to Admission medications   Medication Sig Start Date End Date Taking? Authorizing Provider   acetaminophen (TYLENOL) 325 MG tablet Take 650 mg by mouth every 6 (six) hours as needed. Not to exceed 307m daily   Yes Historical Provider, MD  albuterol (PROVENTIL HFA;VENTOLIN HFA) 108 (90 BASE) MCG/ACT inhaler Inhale 1 puff into the lungs 2 (two) times daily as needed for wheezing or shortness of breath.   Yes Historical Provider, MD  aspirin EC 81 MG tablet Take 81 mg by mouth daily.   Yes Historical Provider, MD  calcium citrate (CALCITRATE - DOSED IN MG ELEMENTAL CALCIUM) 950 MG tablet Take 200 mg of elemental calcium by mouth daily.   Yes Historical Provider, MD  ciprofloxacin (CIPRO) 250 MG tablet Take 250 mg by mouth 2 (two) times daily. For 7 days. Started 8/17 05/08/14  Yes Historical Provider, MD  esomeprazole (NEXIUM 24HR) 20 MG capsule Take 20 mg by mouth daily.   Yes Historical Provider, MD  levothyroxine (SYNTHROID, LEVOTHROID) 75 MCG tablet Take 75 mcg by mouth daily.   Yes Historical Provider, MD  polyethylene glycol (MIRALAX / GLYCOLAX) packet Take 17 g by mouth daily as needed (for constipation).   Yes Historical Provider, MD  Polysaccharide Iron Complex (FERREX 150 PO) Take 150 mg by mouth daily.   Yes Historical Provider, MD  Probiotic Product (ALIGN) 4 MG CAPS Take 1 capsule by mouth daily.   Yes Historical Provider, MD  traMADol (ULTRAM) 50 MG tablet Take 50 mg by mouth every 6 (six) hours as needed for moderate pain.  09/17/13  Yes Historical Provider, MD  VESICARE 10 MG tablet Take 10 mg by mouth daily. 04/20/14  Yes Historical Provider, MD  Complete Medication List: 1)  Ciprofloxacin Hcl 250 Mg Oral Tabs (Ciprofloxacin hcl) ..Marland Kitchen. 1 pill twice a day x 7 days 2)  Aspirin 81 Mg Ec Tab (Aspirin) .... Take one (1) tablet by mouth daily 3)  Vesicare 10 Mg Oral Tabs (Solifenacin succinate) .... One po every day 4)  Tramadol Hcl 50 Mg Oral Tabs (Tramadol hcl) .... One-two every 6hrs as needed 5)  Tylenol 325 Mg Tabs (Acetaminophen) .... Two tabs by mouth every 2hours as  needed 6)  Miralax Pack (Polyethylene glycol 3350) .... Use as needed 7)  Prevacid 15 Mg Cpdr (Lansoprazole) .... Take 1 capsule by mouth every day 8)  Multivitamins Tabs (Multiple vitamin) .... Take one tablet by mouth every day 9)  Calcitrate 950 Mg Tabs (Calcium citrate) .... One po every day 10)  Align 4 Mg Caps (Probiotic product) .... Take one by mouth as directed 11)  Synthroid 75 Mcg Tabs (Levothyroxine sodium) ..Marland Kitchen. 1 po daily 12)  Proair Hfa 108 (90 Base) Mcg/act Aers (Albuterol sulfate) .... Inhale 2 puffs every 6 hours as needed  Allergies:   Allergies  Allergen Reactions  . Horse-Derived Products Other (See Comments)    Loss of appetite  . Macrodantin Other (See Comments)    Unknown reaction  . Premarin [Estrogens, Conjugated] Other (See Comments)    Loss of appetite  . Sulfa Antibiotics Other (See Comments)    Loss of appetite     Social History:  reports that  she quit smoking about 65 years ago. Her smoking use included Cigarettes. She has a 1.6 pack-year smoking history. She has never used smokeless tobacco. She reports that she does not drink alcohol or use illicit drugs. Social History (reviewed - no changes required): Widow. lives alone at [facility]. 5 children (son in [place]). 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 Quit smoking 1969 (1/2 ppd X 15 years). 2 drinks/night. No drug use. Family History: Family History  Problem Relation Age of Onset  . Colon cancer Mother   . Melanoma Father   . Lymphoma Son     Physical Exam: Filed Vitals:   05/09/14 1813  BP: 138/87  Pulse: 75  Temp: 97.8 F (36.6 C)  TempSrc: Oral  Resp: 19  SpO2: 93%   General appearance: non toxic well groomed WF sitting at 30 degrees in no distress. Face symmetric. No nystagmus. Oral  Membranes moist  Neck: supple, no bruits heard Resp: clear with no wheezes, rales or rhonchi, no accessory ms in use Cardio: regular with murmur GI: soft,  non-tender; bowel sounds normal; no masses,  no organomegaly Sl reduced right DP pulse, intact left, no edema. No foot lesions Neuro: awake, tries to answer questions but has apraxia of speech and word finding issues. Good bilat grip, strong. Good dorsiflexion bilat. No resting tremor. .     Labs on Admission:      Recent Labs  05/09/14 2038  WBC 10.4  NEUTROABS 8.8*  HGB 12.6  HCT 35.9*  MCV 90.0  PLT 172   Results for DAMONIE, FURNEY (MRN [id number]) as of 05/09/2014 21:39  Ref. Range 09/21/2013 15:00  Sodium Latest Range: 137-147 mEq/L 133 (L)  Potassium Latest Range: 3.7-5.3 mEq/L 4.2  Chloride Latest Range: 96-112 mEq/L 93 (L)  CO2 Latest Range: 19-32 mEq/L 29  BUN Latest Range: 6-23 mg/dL 12  Creatinine Latest Range: 0.50-1.10 mg/dL 0.66  Calcium Latest Range: 8.4-10.5 mg/dL 10.5  GFR calc non Af Amer Latest Range: >90 mL/min 74 (L)        Glucose Latest Range: 70-99 mg/dL 118 (H)   LABS FROM OUR OFFICE YESTERDAY Tests: (1) COMPLETE METABOLIC (6160)   GLUCOSE              [H]  120 mg/dl                   60-110   BUN                  [H]  34 mg/dl                    5-23   CREATININE           [H]  1.7 mg/dl                   0.3-1.5  eGFR Non-African American                             28.0      SODIUM               [L]  129 mEq/L                   135-148   POTASSIUM                 4.7 mEq/L  3.5-5.3   CHLORIDE                  93 mEq/L                    80-111   CO2                       26 mEq/L                    15-35   CALCIUM              [H]  10.6 mg/dL                  7.0-10.5   TOTAL PROTEIN             7.8 g/dL                    6.0-8.5   ALBUMIN                   3.5 g/dL                    2.0-5.5   AST                       26 IU/L                     7-45   ALT                       15 IU/L                     5-40   ALK PHOS                  80 IU/L                     37-137   TOTAL BILIRUBIN           1.1 mg/dl                    0.1-1.5  Tests: (2) CBC (2000)   WBC                  [HH] 20.90 K/uL                  4.10-10.90     RES=RESULT VERIFIED AND REPORTED TO PHYSICIAN   LYM                       1.6 K/uL                    0.6-4.1 ! MID                       1.8 K/uL                    0.0-1.8   GRAN                 [H]  17.5 K/uL                   2.0-7.8   LYM%                 [L]  7.7 %  10.0-58.5 ! MID%                      8.7 %                       0.1-24.0   GRAN%                     83.6                        37.0-92.0   RBC                  [L]  4.0 M/uL                    4.2-6.3   HGB                       13.0 g/dL                   12.0-18.0   HCT                       37.9 %                      37.0-51.0   MCV                       95.4 fL                     80.0-97.0   MCH                  [H]  32.7 pg                     26.0-32.0   MCHC                      34.3 g/dL                   31.0-36.0   PLT                       274 K/uL                    140-440  Tests: (1) URINALYSIS (4000)   PH                        5.0                         5.0-9.0   SPECIFIC GRAVITY          >=1.030                     1.000 - 1.030 ! GLUCOSE                   NEGATIVE                    NEGATIVE   UROBILINOGEN              0.2 mg/dL                   0.2 mg/dL   KETONES  TRACE mg/dl                 NEGATIVE   BLOOD                     2+                          NEGATIVE   NITRATE                   NEGATIVE                    NEGATIVE   LEUKOCYTES                2+                          NEGATIVE   PROTEIN                   2+ mg/dl                    NEGATIVE   BILIRUBIN                 1+                          NEGATIVE      Radiological Exams on Admission: Ct Head Wo Contrast  05/09/2014   CLINICAL DATA:  Slurred speech.  Altered mental status.  EXAM: CT HEAD WITHOUT CONTRAST  TECHNIQUE: Contiguous axial images were obtained from the base  of the skull through the vertex without intravenous contrast.  COMPARISON:  None.  FINDINGS: There is no evidence of intracranial hemorrhage, brain edema, or other signs of acute infarction. There is no evidence of intracranial mass lesion or mass effect. No abnormal extraaxial fluid collections are identified.  Moderate diffuse cerebral in cerebral atrophy is demonstrated. Mild to moderate chronic small vessel disease is demonstrated as well as old lacunar infarct involving the left lentiform nucleus. No evidence of obstructive hydrocephalus. No skull abnormality identified.  IMPRESSION: No acute intracranial abnormality.  Diffuse cerebral atrophy, chronic small vessel disease, and old left basal ganglia lacunar infarct.   Electronically Signed   By: Earle Gell M.D.   On: 05/09/2014 18:50     Assessment/Plan    UTI (urinary tract infection) culture sent from the office. Not febrile and less leukocytosis noted today. On Cipro orally Getting fluids. UA was too little a specimen to count cells but cx is pending. No sepsis or hemodynamic instability. Check WBC in AM   Acute delirium/  Altered mental status: CT looks fine with regard to new issues but old lacune was seen in the basal ganglia. . Quite confused with some speech disturbances. On ASA. Motor strength is good. May need MRI if sxs persist HYPONATREMIA: sl worse yesterday compared with today. Doubt this is enough to cause the mental status changes   Dementia without behavioral disturbance: not on meds. Not sure what her baseline is   Carotid stenosis: 100% one side. Other OK at last check   Acute renal failure: repeat here today is much better.  ANEMIA/ANGIODYSPLASIA: counts look OK,. On chronic iron tx  CODE STATUS; FULL at the moment with no family around    [facility] 05/09/2014, 9:08 PM

## 2014-05-09 NOTE — Progress Notes (Signed)
Pt arrived to room 6E19. Pt is alert and oriented x2. Pt is stable, no resp distress noted and vital signs within normal limits. Pt and pt's daughter oriented to the room and unit. Bedside call light within reach and bed in low position.On call MD paged. Will monitor pt.    First set of vital signs upon arrival:   .BP 138/87  Pulse 75  Temp(Src) 97.8 F (36.6 C) (Oral)  Resp 19  SpO2 93%

## 2014-05-10 ENCOUNTER — Inpatient Hospital Stay (HOSPITAL_COMMUNITY): Payer: Medicare Other

## 2014-05-10 DIAGNOSIS — R4701 Aphasia: Secondary | ICD-10-CM | POA: Diagnosis present

## 2014-05-10 LAB — CBC WITH DIFFERENTIAL/PLATELET
Basophils Absolute: 0 10*3/uL (ref 0.0–0.1)
Basophils Relative: 0 % (ref 0–1)
EOS ABS: 0 10*3/uL (ref 0.0–0.7)
EOS PCT: 0 % (ref 0–5)
HCT: 34.4 % — ABNORMAL LOW (ref 36.0–46.0)
HEMOGLOBIN: 12.1 g/dL (ref 12.0–15.0)
LYMPHS ABS: 0.5 10*3/uL — AB (ref 0.7–4.0)
Lymphocytes Relative: 5 % — ABNORMAL LOW (ref 12–46)
MCH: 30.9 pg (ref 26.0–34.0)
MCHC: 35.2 g/dL (ref 30.0–36.0)
MCV: 87.8 fL (ref 78.0–100.0)
MONO ABS: 1.4 10*3/uL — AB (ref 0.1–1.0)
Monocytes Relative: 14 % — ABNORMAL HIGH (ref 3–12)
NEUTROS PCT: 81 % — AB (ref 43–77)
Neutro Abs: 7.9 10*3/uL — ABNORMAL HIGH (ref 1.7–7.7)
Platelets: 199 10*3/uL (ref 150–400)
RBC: 3.92 MIL/uL (ref 3.87–5.11)
RDW: 15.4 % (ref 11.5–15.5)
WBC: 9.8 10*3/uL (ref 4.0–10.5)

## 2014-05-10 LAB — URINALYSIS, ROUTINE W REFLEX MICROSCOPIC
Bilirubin Urine: NEGATIVE
Glucose, UA: NEGATIVE mg/dL
Ketones, ur: NEGATIVE mg/dL
Nitrite: NEGATIVE
Protein, ur: 30 mg/dL — AB
Specific Gravity, Urine: 1.009 (ref 1.005–1.030)
UROBILINOGEN UA: 0.2 mg/dL (ref 0.0–1.0)
pH: 7 (ref 5.0–8.0)

## 2014-05-10 LAB — URINE MICROSCOPIC-ADD ON

## 2014-05-10 LAB — COMPREHENSIVE METABOLIC PANEL
ALT: 17 U/L (ref 0–35)
AST: 31 U/L (ref 0–37)
Albumin: 2.8 g/dL — ABNORMAL LOW (ref 3.5–5.2)
Alkaline Phosphatase: 99 U/L (ref 39–117)
Anion gap: 14 (ref 5–15)
BUN: 15 mg/dL (ref 6–23)
CALCIUM: 9.6 mg/dL (ref 8.4–10.5)
CHLORIDE: 94 meq/L — AB (ref 96–112)
CO2: 25 meq/L (ref 19–32)
CREATININE: 0.75 mg/dL (ref 0.50–1.10)
GFR, EST AFRICAN AMERICAN: 82 mL/min — AB (ref >90)
GFR, EST NON AFRICAN AMERICAN: 71 mL/min — AB (ref >90)
GLUCOSE: 103 mg/dL — AB (ref 70–99)
Potassium: 3.9 mEq/L (ref 3.7–5.3)
Sodium: 133 mEq/L — ABNORMAL LOW (ref 137–147)
Total Bilirubin: 0.6 mg/dL (ref 0.3–1.2)
Total Protein: 6.4 g/dL (ref 6.0–8.3)

## 2014-05-10 MED ORDER — GADOBENATE DIMEGLUMINE 529 MG/ML IV SOLN
9.0000 mL | Freq: Once | INTRAVENOUS | Status: AC | PRN
  Administered 2014-05-10: 9 mL via INTRAVENOUS

## 2014-05-10 MED ORDER — ASPIRIN EC 81 MG PO TBEC
81.0000 mg | DELAYED_RELEASE_TABLET | Freq: Every day | ORAL | Status: DC
  Filled 2014-05-10 (×2): qty 1

## 2014-05-10 NOTE — Evaluation (Signed)
Physical Therapy Evaluation Patient Details Name: Teresa Callahan MRN: [id number] DOB: 1917/09/28 Today's Date: 05/10/2014   History of Present Illness  Admitted with confusion and UTI; Also getting Stroke workup Past Medical History  Diagnosis Date  . UTI (lower urinary tract infection)   . Asthma   . Thyroid disease   . Diverticulosis   . Anemia   . Carotid stenosis   . Vertigo   . GERD (gastroesophageal reflux disease)   . Postmenopausal   . Atrophic vaginitis   . Osteoporosis   . Incontinence   . Pneumonia     "probably" (05/09/2014)  . Hypothyroidism   . History of blood transfusion 2014    "not related to a surgery"     Clinical Impression  Pt admitted with above. Pt currently with functional limitations due to the deficits listed below (see PT Problem List).  Pt will benefit from skilled PT to increase their independence and safety with mobility to allow discharge to the venue listed below.   Lengthy discussion with daughter re: dc planning for SNF is a short-term  Answer, and that it is time to open up the conversation re: longer term safe living situation -- home with 24 hour caregivers, or ALF.  Pt's daughter is interested in finding out more information. Notified SW     Follow Up Recommendations SNF;Supervision/Assistance - 24 hour    Equipment Recommendations  None recommended by PT    Recommendations for Other Services       Precautions / Restrictions Precautions Precautions: Fall      Mobility  Bed Mobility Overal bed mobility: Needs Assistance Bed Mobility: Supine to Sit     Supine to sit: Mod assist     General bed mobility comments: Cues for technique; assist to elevat trunk to fully upright sitting  Transfers Overall transfer level: Needs assistance Equipment used: Rolling walker (2 wheeled) Transfers: Sit to/from Stand Sit to Stand: Mod assist         General transfer comment: Mod assist mostly to translate center of mass anteriorly  over feet; has strength to power-up with UE puxh  Ambulation/Gait Ambulation/Gait assistance: Max assist Ambulation Distance (Feet): 2 Feet (pivot steps bed to chair) Assistive device: Rolling walker (2 wheeled) Gait Pattern/deviations: Shuffle     General Gait Details: shuffle steps bed to chair; significant posterior lean requiring max to total assist to stay standing  Stairs            Wheelchair Mobility    Modified Rankin (Stroke Patients Only)       Balance Overall balance assessment: Needs assistance Sitting-balance support: Bilateral upper extremity supported Sitting balance-Leahy Scale: Fair     Standing balance support: Bilateral upper extremity supported Standing balance-Leahy Scale: Zero                               Pertinent Vitals/Pain Pain Assessment: No/denies pain    Home Living Family/patient expects to be discharged to:: Private residence Living Arrangements: Alone Available Help at Discharge: Personal care attendant;Family;Other (Comment) (does not add up to 24 hour supervision and assist) Type of Home: House       Home Layout: Two level;Able to live on main level with bedroom/bathroom Home Equipment: Dan Humphreys - 2 wheels (other equipment to be determined)      Prior Function Level of Independence: Needs assistance   Gait / Transfers Assistance Needed: Uses RW  ADL's / Homemaking Assistance  Needed: caregiver cooks and manages home during the day; son helps with dinner        Hand Dominance        Extremity/Trunk Assessment   Upper Extremity Assessment: Generalized weakness           Lower Extremity Assessment: Generalized weakness         Communication   Communication: HOH;Expressive difficulties  Cognition Arousal/Alertness: Awake/alert Behavior During Therapy: WFL for tasks assessed/performed Overall Cognitive Status: Impaired/Different from baseline Area of Impairment: Orientation Orientation Level:  Disoriented to;Place;Time;Situation   Memory: Decreased short-term memory         General Comments: History of dementia    General Comments General comments (skin integrity, edema, etc.): Noted sore on back at mid thoracic spine; RN notified and applied a mepilex    Exercises        Assessment/Plan    PT Assessment Patient needs continued PT services  PT Diagnosis Difficulty walking;Generalized weakness   PT Problem List Decreased strength;Decreased range of motion;Decreased activity tolerance;Decreased balance;Decreased mobility;Decreased knowledge of use of DME;Decreased knowledge of precautions;Decreased safety awareness  PT Treatment Interventions DME instruction;Gait training;Functional mobility training;Therapeutic activities;Therapeutic exercise;Balance training;Neuromuscular re-education;Cognitive remediation;Patient/family education   PT Goals (Current goals can be found in the Care Plan section) Acute Rehab PT Goals Patient Stated Goal: Did not state PT Goal Formulation: Patient unable to participate in goal setting Time For Goal Achievement: 05/24/14 Potential to Achieve Goals: Good    Frequency Min 3X/week   Barriers to discharge Decreased caregiver support Is home alone at times    Co-evaluation               End of Session Equipment Utilized During Treatment: Gait belt Activity Tolerance: Patient tolerated treatment well Patient left: in chair;with call bell/phone within reach;with family/visitor present Nurse Communication: Mobility status         Time: 1110-1153 PT Time Calculation (min): 43 min   Charges:   PT Evaluation $Initial PT Evaluation Tier I: 1 Procedure PT Treatments $Therapeutic Activity: 38-52 mins   Van ClinesHolly Daizee Firmin, PT  Acute Rehabilitation Services Pager [phone] Office [phone]   PT G Codes:          Van ClinesGarrigan, Gerrianne Aydelott [facility] 05/10/2014, 2:09 PM

## 2014-05-10 NOTE — Clinical Social Work Placement (Addendum)
Clinical Social Work Department CLINICAL SOCIAL WORK PLACEMENT NOTE 05/10/2014  Patient:  Teresa Callahan,Teresa Callahan  Account Number:  [id number] Admit date:  05/09/2014  Clinical Social Worker:  Genelle BalVANESSA Ester Hilley, LCSW  Date/time:  05/09/2014 12:00 M  Clinical Social Work is seeking post-discharge placement for this patient at the following level of care:   SKILLED NURSING   (*CSW will update this form in Epic as items are completed)     [facility]/family provided with [facility] Department of Clinical Social Work's list of facilities offering this level of care within the geographic area requested by the patient (or if unable, by the patient's family).  05/10/2014  Patient/family informed of their freedom to choose among providers that offer the needed level of care, that participate in Medicare, Medicaid or managed care program needed by the patient, have an available bed and are willing to accept the patient.    Patient/family informed of [facility]' ownership interest in [facility], as well as of the fact that they are under no obligation to receive care at this facility.  PASARR submitted to EDS on  PASARR number received on   FL2 transmitted to all facilities in geographic area requested by pt/family on  05/10/2014 FL2 transmitted to all facilities within larger geographic area on   Patient informed that his/her managed care company has contracts with or will negotiate with  certain facilities, including the following:     Patient/family informed of bed offers received: 05/10/2014  [facility] Physician recommends and patient chooses bed at    Patient to be transferred to Home on 05/12/14   Patient to be transported home by family on 05/12/14  Patient and family notified of transfer on: CSW talked with daughter regarding change in discharge plans from facility to home on 05/12/14.  Name of family member notified: Daughter on 05/12/14    The following physician request were entered in Epic:  Additional Comments:

## 2014-05-10 NOTE — Evaluation (Signed)
Occupational Therapy Evaluation Patient Details Name: Teresa Callahan MRN: [id number] DOB: 1918/03/16 Today's Date: 05/10/2014    History of Present Illness Admitted with confusion and UTI; Also getting Stroke workup   Clinical Impression   PT admitted with AMS and UTI. WOrk up underway for CVA. Pt currently with functional limitiations due to the deficits listed below (see OT problem list). PTA living at home alone.  Pt will benefit from skilled OT to increase their independence and safety with adls and balance to allow discharge SNf. OT to follow acutely for adl retraining and cognitive recovery.     Follow Up Recommendations  SNF;Supervision/Assistance - 24 hour (must have 24/ 7 (A) for d/c )    Equipment Recommendations  Other (comment) (defer)    Recommendations for Other Services       Precautions / Restrictions Precautions Precautions: Fall      Mobility Bed Mobility Overal bed mobility: Needs Assistance Bed Mobility: Supine to Sit     Supine to sit: Min guard     General bed mobility comments: using bed rails and mod v/c to continue task. pt with very focused attention  Transfers Overall transfer level: Needs assistance Equipment used: Rolling walker (2 wheeled) Transfers: Sit to/from Stand Sit to Stand: Mod assist         General transfer comment: mod (A for safety and posterior lean    Balance Overall balance assessment: Needs assistance Sitting-balance support: Bilateral upper extremity supported;Feet supported Sitting balance-Leahy Scale: Fair     Standing balance support: No upper extremity supported;During functional activity Standing balance-Leahy Scale: Zero                              ADL Overall ADL's : Needs assistance/impaired     Grooming: Oral care;Moderate assistance;Standing Grooming Details (indicate cue type and reason): (A) for balance posterior lean and very easily distracted         Upper Body Dressing :  Moderate assistance;Standing Upper Body Dressing Details (indicate cue type and reason): pt attempting to doff gown on arrival and reports she was putting it on     Toilet Transfer: Ambulation;Regular Toilet;Maximal assistance;RW Toilet Transfer Details (indicate cue type and reason): (A) to control descend to toilet and unable to power up from toilet to stand without max (A) Toileting- Clothing Manipulation and Hygiene: Total assistance Toileting - Clothing Manipulation Details (indicate cue type and reason): pt attempting peri care with 3 square of tissue. pt required (A)      Functional mobility during ADLs: Minimal assistance;Rolling walker General ADL Comments: pt incontinent of bladder on arrival and unaware. pt with gown changed. Pt (A) with oral care at sink level. tp with balance deficits. Pt needed mod v/c for toilet transfer. Pt picking at gown throughout session without clear purpose. Pt reports reason for admission is to have home phone service fixed. Ot repeating statement back to patient and pt states "thats right" pt oriented to person place  and DOB.      Vision                     Perception     Praxis      Pertinent Vitals/Pain Pain Assessment: No/denies pain     Hand Dominance Right   Extremity/Trunk Assessment Upper Extremity Assessment Upper Extremity Assessment: Overall WFL for tasks assessed   Lower Extremity Assessment Lower Extremity Assessment: Defer to PT evaluation  Cervical / Trunk Assessment Cervical / Trunk Assessment: Kyphotic   Communication Communication Communication: HOH;Expressive difficulties   Cognition Arousal/Alertness: Awake/alert Behavior During Therapy: WFL for tasks assessed/performed Overall Cognitive Status: History of cognitive impairments - at baseline Area of Impairment: Orientation;Following commands;Safety/judgement;Awareness;Problem solving Orientation Level: Disoriented to;Situation;Time   Memory: Decreased  short-term memory Following Commands: Follows one step commands inconsistently Safety/Judgement: Decreased awareness of deficits Awareness: Intellectual Problem Solving: Slow processing;Decreased initiation General Comments: History of dementia   General Comments       Exercises       Shoulder Instructions      Home Living Family/patient expects to be discharged to:: Skilled nursing facility Living Arrangements: Alone Available Help at Discharge: Personal care attendant;Family;Other (Comment) (does not add up to 24 hour supervision and assist) Type of Home: House       Home Layout: Two level;Able to live on main level with bedroom/bathroom Alternate Level Stairs-Number of Steps:  (Has a stair lift)             Home Equipment: Walker - 2 wheels (other equipment to be determined)   Additional Comments: all home information obtained from PT notes      Prior Functioning/Environment Level of Independence: Needs assistance  Gait / Transfers Assistance Needed: Uses RW ADL's / Homemaking Assistance Needed: caregiver cooks and manages home during the day; son helps with dinner Communication / Swallowing Assistance Needed: Per daughter, pt has not been able t swallow since this past Sunday; before that no apparent problems      OT Diagnosis: Generalized weakness;Cognitive deficits   OT Problem List: Decreased strength;Decreased activity tolerance;Impaired balance (sitting and/or standing);Decreased cognition;Decreased safety awareness;Decreased knowledge of use of DME or AE;Decreased knowledge of precautions   OT Treatment/Interventions: Self-care/ADL training;Therapeutic exercise;DME and/or AE instruction;Therapeutic activities;Cognitive remediation/compensation;Patient/family education;Balance training    OT Goals(Current goals can be found in the care plan section) Acute Rehab OT Goals Patient Stated Goal: to get her phone fixed at home OT Goal Formulation: Patient unable  to participate in goal setting Time For Goal Achievement: 05/24/14 Potential to Achieve Goals: Good  OT Frequency: Min 2X/week   Barriers to D/C: Decreased caregiver support  lives alone and question 24/ 7 (A) available       Co-evaluation              End of Session Equipment Utilized During Treatment: Gait belt;Rolling walker Nurse Communication: Mobility status;Precautions  Activity Tolerance: Patient tolerated treatment well Patient left: in bed;with call bell/phone within reach;with bed alarm set   Time: 1457-1520 OT Time Calculation (min): 23 min Charges:  OT General Charges $OT Visit: 1 Procedure OT Evaluation $Initial OT Evaluation Tier I: 1 Procedure OT Treatments $Self Care/Home Management : 8-22 mins G-Codes:    Harolyn Rutherford 05-11-14, 3:39 PM Pager: [phone]

## 2014-05-10 NOTE — Progress Notes (Signed)
Subjective: Patient is more alert and verbal but continues to have some difficulty with word-finding.  RN reports incontinence.  She endorses excessive urination.  No other complaints.  Objective: Vital signs in last 24 hours: Temp:  [97.8 F (36.6 C)-98.5 F (36.9 C)] 98.5 F (36.9 C) (08/19 0534) Pulse Rate:  [75-94] 89 (08/19 0534) Resp:  [19-20] 20 (08/19 0534) BP: (113-138)/(43-87) 134/44 mmHg (08/19 0534) SpO2:  [93 %-98 %] 98 % (08/19 0534) Weight:  [50.758 kg (111 lb 14.4 oz)] 50.758 kg (111 lb 14.4 oz) (08/18 2125) Weight change:     CBG (last 3)  No results found for this basename: GLUCAP,  in the last 72 hours  Intake/Output from previous day: 08/18 0701 - 08/19 0700 In: 795 [P.O.:120; I.V.:625; IV Piggyback:50] Out: -  Intake/Output this shift:    General appearance: alert and disoriented Eyes: no scleral icterus Throat: oropharynx moist without erythema Resp: clear to auscultation bilaterally Cardio: regular rate and rhythm and grade 3/6 SEM GI: soft, non-tender; bowel sounds normal; no masses,  no organomegaly Extremities: no clubbing, cyanosis or edema Neurologic: some speech issues with difficulty completing sentences with the correct words; no focal weakness or facial assymmetry  Lab Results:  Recent Labs  05/09/14 2038 05/10/14 0511  NA 126* 133*  K 3.9 3.9  CL 88* 94*  CO2 26 25  GLUCOSE 125* 103*  BUN 21 15  CREATININE 0.78 0.75  CALCIUM 9.5 9.6    Recent Labs  05/09/14 2038 05/10/14 0511  AST 29 31  ALT 16 17  ALKPHOS 98 99  BILITOT 0.5 0.6  PROT 6.6 6.4  ALBUMIN 2.8* 2.8*    Recent Labs  05/09/14 2038 05/10/14 0511  WBC 10.4 9.8  NEUTROABS 8.8* 7.9*  HGB 12.6 12.1  HCT 35.9* 34.4*  MCV 90.0 87.8  PLT 172 199   No results found for this basename: INR, PROTIME   No results found for this basename: CKTOTAL, CKMB, CKMBINDEX, TROPONINI,  in the last 72 hours  Recent Labs  05/09/14 2038  TSH 2.360   No results found  for this basename: VITAMINB12, FOLATE, FERRITIN, TIBC, IRON, RETICCTPCT,  in the last 72 hours  Studies/Results: Ct Head Wo Contrast  05/09/2014   CLINICAL DATA:  Slurred speech.  Altered mental status.  EXAM: CT HEAD WITHOUT CONTRAST  TECHNIQUE: Contiguous axial images were obtained from the base of the skull through the vertex without intravenous contrast.  COMPARISON:  None.  FINDINGS: There is no evidence of intracranial hemorrhage, brain edema, or other signs of acute infarction. There is no evidence of intracranial mass lesion or mass effect. No abnormal extraaxial fluid collections are identified.  Moderate diffuse cerebral in cerebral atrophy is demonstrated. Mild to moderate chronic small vessel disease is demonstrated as well as old lacunar infarct involving the left lentiform nucleus. No evidence of obstructive hydrocephalus. No skull abnormality identified.  IMPRESSION: No acute intracranial abnormality.  Diffuse cerebral atrophy, chronic small vessel disease, and old left basal ganglia lacunar infarct.   Electronically Signed   By: Myles Rosenthal M.D.   On: 05/09/2014 18:50     Medications: Scheduled: . cefTRIAXone (ROCEPHIN)  IV  1 g Intravenous Q24H  . enoxaparin (LOVENOX) injection  30 mg Subcutaneous Q24H  . levothyroxine  75 mcg Oral QAC breakfast  . multivitamin with minerals  1 tablet Oral Daily  . pantoprazole  40 mg Oral Daily  . saccharomyces boulardii  250 mg Oral BID  . sodium chloride  3 mL Intravenous Q12H   Continuous: . sodium chloride 125 mL/hr at 05/10/14 [id number]    Assessment/Plan: Active Problems: 1. UTI (urinary tract infection)- repeat urinalysis pending.  Culture from our office pending.  Will continue Rocephin pending cultures.  She has no hemodynamic instability though acute renal failure, altered mental status, leukocytosis prior to admission were concerning for sepsis.  Blood cultures pending. 2. Acute delirium with Expressive Aphasia- she is having some  difficulty with word finding.  No other acute neurologic findings.  CT negative for acute CVA.  Will obtain MRI Brain to r/o CVA.  Continue Aspirin until CVA ruled out (will discontinue if negative due to prior GI bleeding issues).  She has known right carotid occlusion. 3. Acute renal failure- improved- continue IV fluids. 4. Unspecified hypothyroidism- continue synthroid.  TSH pending. 5. Disposition- PT/OT evaluation, OOB.  Anticipate discharge in 1-2 days if stable.   LOS: 1 day   Martha ClanShaw, Scherry Laverne 05/10/2014, 7:19 AM

## 2014-05-10 NOTE — Clinical Social Work Psychosocial (Signed)
Clinical Social Work Department BRIEF PSYCHOSOCIAL ASSESSMENT 05/10/2014  Patient:  Teresa Callahan,Teresa Callahan     Account Number:  [id number]     Admit date:  05/09/2014  Clinical Social Worker:  Delmer IslamRAWFORD,Ranesha Val, LCSW  Date/Time:  05/10/2014 05:25 AM  Referred by:  Physician  Date Referred:  05/10/2014 Referred for  SNF Placement   Other Referral:   Interview type:  Family Other interview type:    PSYCHOSOCIAL DATA Living Status:  ALONE Admitted from facility:   Level of care:   Primary support name:  Teresa Callahan Primary support relationship to patient:  CHILD, ADULT Degree of support available:   Strong support. Per daughter Teresa Callahan, she and brother Teresa Callahan looks after patient.    CURRENT CONCERNS Current Concerns  Post-Acute Placement   Other Concerns:    SOCIAL WORK ASSESSMENT / PLAN CSW visited room and talked with patient's daughter regarding d/c plans and recommendation of ST rehab. Patient slept through most of visit, but did smile and acknowledge CSW before conversation with daughter ended. Daughter reported that patient in need of extensive rehab due to her decline in taking care of herself and being able to participate in some of the activities that she enjoyed. [place] and [place] in [place]. She explained that patient has help in the morning to assist her with her shower, getting dressed and breakfast. Patient is alone for a few hours, but she or her brother is with her every evening and spends the night with her. Per Teresa Callahan, the facility preference is [facility] as patient has been there before and they were pleased with the care she received.  [facility] and they can take patient when stable for discharge.   Assessment/plan status:  Psychosocial Support/Ongoing Assessment of Needs Other assessment/ plan:   Information/referral to community resources:   None requested or  needed at this time.    PATIENT'S/FAMILY'S RESPONSE TO PLAN OF CARE: Patient slept through conversation with daughter. Teresa Callahan wants rehab for patient and their preference is [facility].

## 2014-05-11 LAB — URINE CULTURE
Colony Count: NO GROWTH
Culture: NO GROWTH

## 2014-05-11 LAB — CBC WITH DIFFERENTIAL/PLATELET
Basophils Absolute: 0 10*3/uL (ref 0.0–0.1)
Basophils Relative: 0 % (ref 0–1)
EOS PCT: 1 % (ref 0–5)
Eosinophils Absolute: 0 10*3/uL (ref 0.0–0.7)
HCT: 34.1 % — ABNORMAL LOW (ref 36.0–46.0)
Hemoglobin: 11.7 g/dL — ABNORMAL LOW (ref 12.0–15.0)
LYMPHS PCT: 7 % — AB (ref 12–46)
Lymphs Abs: 0.5 10*3/uL — ABNORMAL LOW (ref 0.7–4.0)
MCH: 30.5 pg (ref 26.0–34.0)
MCHC: 34.3 g/dL (ref 30.0–36.0)
MCV: 88.8 fL (ref 78.0–100.0)
Monocytes Absolute: 1.4 10*3/uL — ABNORMAL HIGH (ref 0.1–1.0)
Monocytes Relative: 19 % — ABNORMAL HIGH (ref 3–12)
Neutro Abs: 5.1 10*3/uL (ref 1.7–7.7)
Neutrophils Relative %: 73 % (ref 43–77)
Platelets: 219 10*3/uL (ref 150–400)
RBC: 3.84 MIL/uL — AB (ref 3.87–5.11)
RDW: 15.8 % — AB (ref 11.5–15.5)
WBC: 7 10*3/uL (ref 4.0–10.5)

## 2014-05-11 LAB — BASIC METABOLIC PANEL
Anion gap: 14 (ref 5–15)
BUN: 9 mg/dL (ref 6–23)
CALCIUM: 8.6 mg/dL (ref 8.4–10.5)
CO2: 22 meq/L (ref 19–32)
Chloride: 99 mEq/L (ref 96–112)
Creatinine, Ser: 0.59 mg/dL (ref 0.50–1.10)
GFR calc Af Amer: 89 mL/min — ABNORMAL LOW (ref >90)
GFR calc non Af Amer: 77 mL/min — ABNORMAL LOW (ref >90)
GLUCOSE: 82 mg/dL (ref 70–99)
Potassium: 3.3 mEq/L — ABNORMAL LOW (ref 3.7–5.3)
Sodium: 135 mEq/L — ABNORMAL LOW (ref 137–147)

## 2014-05-11 NOTE — Progress Notes (Signed)
SLP Cancellation Note  Patient Details Name: Teresa Callahan MRN: [id number] DOB: [birth year]   Cancelled treatment:       Reason Eval/Treat Not Completed: SLP screened, no needs identified, will sign off. Daughter present denies any difficulties with communication, reporting that all cognitive-linguistic abilities are back to baseline. She very politely declined a speech-language evaluation at this time.    Maxcine HamLaura Paiewonsky, M.A. CCC-SLP [phone]  Maxcine Hamaiewonsky, Ura Hausen 05/11/2014, 4:00 PM

## 2014-05-11 NOTE — Progress Notes (Signed)
Subjective: RN reports improved cognitive status overnight.  Patient is sleeping but awakens without complaint.  Denies shortness of breath, cough.    Objective: Vital signs in last 24 hours: Temp:  [97.7 F (36.5 C)-100.3 F (37.9 C)] 98.5 F (36.9 C) (08/20 0534) Pulse Rate:  [80-87] 87 (08/20 0534) Resp:  [16-20] 18 (08/20 0534) BP: (139-154)/(52-79) 154/79 mmHg (08/20 0534) SpO2:  [95 %-98 %] 98 % (08/20 0534) Weight change:  Last BM Date: 05/09/14  CBG (last 3)  No results found for this basename: GLUCAP,  in the last 72 hours  Intake/Output from previous day: 08/19 0701 - 08/20 0700 In: 360 [P.O.:360] Out: -  Intake/Output this shift:    General appearance: alert and no distress Eyes: no scleral icterus Throat: oropharynx moist without erythema Resp: clear to auscultation bilaterally Cardio: regular rate and rhythm GI: soft, non-tender; bowel sounds normal; no masses,  no organomegaly Extremities: no clubbing, cyanosis or edema Neurologic: speech appears to be back to normal with no aphasia  Lab Results:  Recent Labs  05/09/14 2038 05/10/14 0511  NA 126* 133*  K 3.9 3.9  CL 88* 94*  CO2 26 25  GLUCOSE 125* 103*  BUN 21 15  CREATININE 0.78 0.75  CALCIUM 9.5 9.6    Recent Labs  05/09/14 2038 05/10/14 0511  AST 29 31  ALT 16 17  ALKPHOS 98 99  BILITOT 0.5 0.6  PROT 6.6 6.4  ALBUMIN 2.8* 2.8*    Recent Labs  05/09/14 2038 05/10/14 0511  WBC 10.4 9.8  NEUTROABS 8.8* 7.9*  HGB 12.6 12.1  HCT 35.9* 34.4*  MCV 90.0 87.8  PLT 172 199   No results found for this basename: INR, PROTIME   No results found for this basename: CKTOTAL, CKMB, CKMBINDEX, TROPONINI,  in the last 72 hours  Recent Labs  05/09/14 2038  TSH 2.360   No results found for this basename: VITAMINB12, FOLATE, FERRITIN, TIBC, IRON, RETICCTPCT,  in the last 72 hours  Studies/Results: Ct Head Wo Contrast  05/09/2014   CLINICAL DATA:  Slurred speech.  Altered mental  status.  EXAM: CT HEAD WITHOUT CONTRAST  TECHNIQUE: Contiguous axial images were obtained from the base of the skull through the vertex without intravenous contrast.  COMPARISON:  None.  FINDINGS: There is no evidence of intracranial hemorrhage, brain edema, or other signs of acute infarction. There is no evidence of intracranial mass lesion or mass effect. No abnormal extraaxial fluid collections are identified.  Moderate diffuse cerebral in cerebral atrophy is demonstrated. Mild to moderate chronic small vessel disease is demonstrated as well as old lacunar infarct involving the left lentiform nucleus. No evidence of obstructive hydrocephalus. No skull abnormality identified.  IMPRESSION: No acute intracranial abnormality.  Diffuse cerebral atrophy, chronic small vessel disease, and old left basal ganglia lacunar infarct.   Electronically Signed   By: Myles RosenthalJohn  Stahl M.D.   On: 05/09/2014 18:50   Mr Laqueta JeanBrain W UJWo Contrast  05/10/2014   CLINICAL DATA:  Speech disturbance. Balance disturbance. Confusion.  EXAM: MRI HEAD WITHOUT AND WITH CONTRAST  TECHNIQUE: Multiplanar, multiecho pulse sequences of the brain and surrounding structures were obtained without and with intravenous contrast.  CONTRAST:  9mL MULTIHANCE GADOBENATE DIMEGLUMINE 529 MG/ML IV SOLN  COMPARISON:  Head CT 05/09/2014  FINDINGS: Diffusion imaging does not show any acute or subacute infarction. The brainstem is unremarkable. There is cerebellar atrophy but no focal insult. The cerebral hemispheres show generalized atrophy with moderate chronic small vessel disease  affecting the deep and subcortical white matter. No evidence of mass lesion, hemorrhage, obstructive hydrocephalus or extra-axial collection. After contrast administration, no abnormal enhancement occurs. No pituitary mass. No fluid in the sinuses. There is fluid throughout the mastoid air cells on the right. No evidence of nasopharyngeal mass.  IMPRESSION: Brain atrophy and chronic small  vessel disease. No sign of recent infarction.  Fluid in the mastoid air cells on the right.   Electronically Signed   By: Paulina Fusi M.D.   On: 05/10/2014 21:07     Medications: Scheduled: . aspirin EC  81 mg Oral Daily  . cefTRIAXone (ROCEPHIN)  IV  1 g Intravenous Q24H  . enoxaparin (LOVENOX) injection  30 mg Subcutaneous Q24H  . levothyroxine  75 mcg Oral QAC breakfast  . multivitamin with minerals  1 tablet Oral Daily  . pantoprazole  40 mg Oral Daily  . saccharomyces boulardii  250 mg Oral BID  . sodium chloride  3 mL Intravenous Q12H   Continuous: . sodium chloride 100 mL/hr at 05/10/14 1900    Assessment/Plan: Active Problems:  1. UTI (urinary tract infection)- continue Rocephin pending urine cultures from office and after antibiotics started here and transition to oral alternative on discharge. 2. Acute delirium with Expressive Aphasia- no evidence of stroke on MRI.  Clinical course consistent with metabolic encephalopathy due to infection.  Seems to be improving.  Discontinue ASA due to history of GI bleed/AVM. 3. Acute renal failure-labs pending- continue IV fluids X 24 hours 4. Unspecified hypothyroidism- continue synthroid. TSH pending.  5. Disposition- continue PT/OT/ST evaluations, OOB.  Will continue IV fluids, IV antibiotics and therapy services for additional 24 hours.  Anticipate discharge tomorrow to home with 24 hour assistance vs. SNF rehab.  SW involved.  6. Ethics- discussed with daughter yesterday and she is FULL CODE but does not desire long term life support.   LOS: 2 days   Martha Clan 05/11/2014, 7:26 AM

## 2014-05-12 DIAGNOSIS — E871 Hypo-osmolality and hyponatremia: Secondary | ICD-10-CM | POA: Diagnosis present

## 2014-05-12 LAB — CBC WITH DIFFERENTIAL/PLATELET
BASOS ABS: 0.1 10*3/uL (ref 0.0–0.1)
Basophils Relative: 1 % (ref 0–1)
EOS ABS: 0.1 10*3/uL (ref 0.0–0.7)
Eosinophils Relative: 2 % (ref 0–5)
HEMATOCRIT: 33 % — AB (ref 36.0–46.0)
Hemoglobin: 11.2 g/dL — ABNORMAL LOW (ref 12.0–15.0)
LYMPHS ABS: 0.8 10*3/uL (ref 0.7–4.0)
Lymphocytes Relative: 13 % (ref 12–46)
MCH: 29.9 pg (ref 26.0–34.0)
MCHC: 33.9 g/dL (ref 30.0–36.0)
MCV: 88 fL (ref 78.0–100.0)
MONO ABS: 1.2 10*3/uL — AB (ref 0.1–1.0)
Monocytes Relative: 20 % — ABNORMAL HIGH (ref 3–12)
NEUTROS ABS: 4 10*3/uL (ref 1.7–7.7)
Neutrophils Relative %: 64 % (ref 43–77)
Platelets: 248 10*3/uL (ref 150–400)
RBC: 3.75 MIL/uL — ABNORMAL LOW (ref 3.87–5.11)
RDW: 15.5 % (ref 11.5–15.5)
WBC: 6.2 10*3/uL (ref 4.0–10.5)

## 2014-05-12 LAB — BASIC METABOLIC PANEL
Anion gap: 11 (ref 5–15)
BUN: 6 mg/dL (ref 6–23)
CALCIUM: 8.5 mg/dL (ref 8.4–10.5)
CO2: 24 meq/L (ref 19–32)
Chloride: 103 mEq/L (ref 96–112)
Creatinine, Ser: 0.64 mg/dL (ref 0.50–1.10)
GFR calc Af Amer: 86 mL/min — ABNORMAL LOW (ref >90)
GFR calc non Af Amer: 75 mL/min — ABNORMAL LOW (ref >90)
GLUCOSE: 91 mg/dL (ref 70–99)
Potassium: 3.2 mEq/L — ABNORMAL LOW (ref 3.7–5.3)
Sodium: 138 mEq/L (ref 137–147)

## 2014-05-12 MED ORDER — CIPROFLOXACIN HCL 250 MG PO TABS
250.0000 mg | ORAL_TABLET | Freq: Two times a day (BID) | ORAL | Status: AC
Start: 2014-05-12 — End: 2014-05-15

## 2014-05-12 MED ORDER — WHITE PETROLATUM GEL
Status: AC
  Filled 2014-05-12: qty 5

## 2014-05-12 NOTE — Care Management Note (Signed)
CARE MANAGEMENT NOTE 05/12/2014  Patient:  Teresa Callahan, Teresa Callahan   Account Number:  [id number]  Date Initiated:  05/12/2014  Documentation initiated by:  Logon Uttech  Subjective/Objective Assessment:   CM following for progression and d/c planning.     Action/Plan:   05/12/2014 Met with pt and pt will return to SNF, IM given and explained to pt .   Anticipated DC Date:  05/12/2014   Anticipated DC Plan:  SKILLED NURSING FACILITY         Choice offered to / List presented to:             Status of service:  Completed, signed off Medicare Important Message given?  YES (If response is "NO", the following Medicare IM given date fields will be blank) Date Medicare IM given:  05/12/2014 Medicare IM given by:  Saxon Barich Date Additional Medicare IM given:   Additional Medicare IM given by:    Discharge Disposition:  [place]  Per UR Regulation:    If discussed at Long Length of Stay Meetings, dates discussed:    Comments:

## 2014-05-12 NOTE — Discharge Summary (Signed)
DISCHARGE SUMMARY  Teresa Callahan  MR#: [id number]  DOB:1918-02-11  Date of Admission: 05/09/2014 Date of Discharge: 05/12/2014  Attending Physician:Teresa Callahan, Teresa Callahan  Patient's JYN:[id number]:Teresa Callahan, Teresa NoaWilliam, MD  Consults:  None  Discharge Diagnoses: Active Problems:   Acute delirium/Metabolic Encephalopathy   Expressive aphasia   Acute renal failure   Hyponatremia   UTI (urinary tract infection)   Altered mental status   Carotid stenosis   Unspecified hypothyroidism  Discharge Medications:   Medication List    STOP taking these medications       aspirin EC 81 MG tablet     VESICARE 10 MG tablet  Generic drug:  solifenacin      TAKE these medications       acetaminophen 325 MG tablet  Commonly known as:  TYLENOL  Take 650 mg by mouth every 6 (six) hours as needed. Not to exceed 3000mg  daily     albuterol 108 (90 BASE) MCG/ACT inhaler  Commonly known as:  PROVENTIL HFA;VENTOLIN HFA  Inhale 1 puff into the lungs 2 (two) times daily as needed for wheezing or shortness of breath.     ALIGN 4 MG Caps  Take 1 capsule by mouth daily.     calcium citrate 950 MG tablet  Commonly known as:  CALCITRATE - dosed in mg elemental calcium  Take 200 mg of elemental calcium by mouth daily.     ciprofloxacin 250 MG tablet  Commonly known as:  CIPRO  Take 1 tablet (250 mg total) by mouth 2 (two) times daily. For 7 days. Started 8/17     FERREX 150 PO  Take 150 mg by mouth daily.     levothyroxine 75 MCG tablet  Commonly known as:  SYNTHROID, LEVOTHROID  Take 75 mcg by mouth daily.     NEXIUM 24HR 20 MG capsule  Generic drug:  esomeprazole  Take 20 mg by mouth daily.     polyethylene glycol packet  Commonly known as:  MIRALAX / GLYCOLAX  Take 17 g by mouth daily as needed (for constipation).     traMADol 50 MG tablet  Commonly known as:  ULTRAM  Take 50 mg by mouth every 6 (six) hours as needed for moderate pain.        Hospital Procedures: Ct Head Wo Contrast  05/09/2014    CLINICAL DATA:  Slurred speech.  Altered mental status.  EXAM: CT HEAD WITHOUT CONTRAST  TECHNIQUE: Contiguous axial images were obtained from the base of the skull through the vertex without intravenous contrast.  COMPARISON:  None.  FINDINGS: There is no evidence of intracranial hemorrhage, brain edema, or other signs of acute infarction. There is no evidence of intracranial mass lesion or mass effect. No abnormal extraaxial fluid collections are identified.  Moderate diffuse cerebral in cerebral atrophy is demonstrated. Mild to moderate chronic small vessel disease is demonstrated as well as old lacunar infarct involving the left lentiform nucleus. No evidence of obstructive hydrocephalus. No skull abnormality identified.  IMPRESSION: No acute intracranial abnormality.  Diffuse cerebral atrophy, chronic small vessel disease, and old left basal ganglia lacunar infarct.   Electronically Signed   By: Teresa RosenthalJohn  Callahan M.D.   On: 05/09/2014 18:50   Mr Teresa JeanBrain W AOWo Contrast  05/10/2014   CLINICAL DATA:  Speech disturbance. Balance disturbance. Confusion.  EXAM: MRI HEAD WITHOUT AND WITH CONTRAST  TECHNIQUE: Multiplanar, multiecho pulse sequences of the brain and surrounding structures were obtained without and with intravenous contrast.  CONTRAST:  9mL MULTIHANCE GADOBENATE DIMEGLUMINE 529  MG/ML IV SOLN  COMPARISON:  Head CT 05/09/2014  FINDINGS: Diffusion imaging does not show any acute or subacute infarction. The brainstem is unremarkable. There is cerebellar atrophy but no focal insult. The cerebral hemispheres show generalized atrophy with moderate chronic small vessel disease affecting the deep and subcortical white matter. No evidence of mass lesion, hemorrhage, obstructive hydrocephalus or extra-axial collection. After contrast administration, no abnormal enhancement occurs. No pituitary mass. No fluid in the sinuses. There is fluid throughout the mastoid air cells on the right. No evidence of nasopharyngeal mass.   IMPRESSION: Brain atrophy and chronic small vessel disease. No sign of recent infarction.  Fluid in the mastoid air cells on the right.   Electronically Signed   By: Teresa Callahan M.D.   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 She was seen in the office on the day prior to admission with the complaint of increased lethargy, nausea weakness, and confusion x3-4 days. This is associated with increasing gait instability, speech changes with some difficulty finishing sentences with the correct words.  When she was seen in the office her BUN and creatinine were 34 and 1.7 respectively consistent with volume depletion with an elevated white blood count of 20. Admission was recommended at that time that her daughter preferred to take her home with oral antibiotics and to push fluids. She called back the following afternoon reporting worsening symptoms. She was directly admitted for further management  Hospital Course: Teresa Callahan was admitted to a telemetry bed. She was placed on IV Rocephin and IV fluid hydration. Upon admission her white blood count was significantly improved at 9.8 with a BUN and creatinine of 15 and 0.75 respectively. She was hemodynamically stable but remained very lethargic and weak. With fluid hydration and narcotics her condition gradually improved with resolution of her confusion and speech issues. A head CT was performed on admission showed no acute intracranial abnormality. An MRI was subsequently performed to rule out stroke that showed no acute findings. It was felt that her cognitive and speech issues were related to delirium/metabolic encephalopathy related to her infection.  Urine culture from the office are still pending. Blood in Urine cultures from the hospital (obtaine  after starting antibiotics) are negative.  She was evaluated by physical therapy and occupational therapy who recommended 24-hour assistance versus skilled nursing rehabilitation. I discussed this with her daughter who feels that she needs rehabilitation prior to return home. Arrangements are being made for transfer to a skilled nursing facility for short-term rehabilitation. She does live independently with assistance from her family.  Day of Discharge Exam BP 151/64  Pulse 77  Temp(Src) 97.9 F (36.6 C) (Oral)  Resp 17  Ht 5\' 3"  (1.6 m)  Wt 52.118 kg (114 lb 14.4 oz)  BMI 20.36 kg/m2  SpO2 97%  Physical Exam: General appearance: alert and no distress Eyes: no scleral icterus Throat: oropharynx moist without erythema Resp: clear to auscultation bilaterally Cardio: regular rate and rhythm GI: soft, non-tender; bowel sounds normal; no masses,  no organomegaly Extremities: no clubbing, cyanosis or edema Neurologic: alert and oriented X3; no focal deficits  Discharge Labs:  Recent Labs  05/10/14 0511 05/11/14 0600  NA 133* 135*  K 3.9 3.3*  CL 94* 99  CO2 25 22  GLUCOSE 103* 82  BUN 15  9  CREATININE 0.75 0.59  CALCIUM 9.6 8.6    Recent Labs  05/09/14 2038 05/10/14 0511  AST 29 31  ALT 16 17  ALKPHOS 98 99  BILITOT 0.5 0.6  PROT 6.6 6.4  ALBUMIN 2.8* 2.8*    Recent Labs  05/10/14 0511 05/11/14 0600  WBC 9.8 7.0  NEUTROABS 7.9* 5.1  HGB 12.1 11.7*  HCT 34.4* 34.1*  MCV 87.8 88.8  PLT 199 219    Recent Labs  05/09/14 2038  TSH 2.360    Discharge instructions:     Discharge Instructions   Diet general    Complete by:  As directed      Increase activity slowly    Complete by:  As directed            Disposition: to skilled nursing facility pending bed availability  Follow-up Appts: Follow-up with Dr. Clelia Croft at [facility] within 1 week of discharge from Skilled Nursing Facility- call at time of discharge.  Condition on  Discharge: improved  Tests Needing Follow-up: urine cultures from [facility]  Signed: Martha Clan 05/12/2014, 7:49 AM

## 2014-05-12 NOTE — Progress Notes (Signed)
Patient Discharge:  Patient discharged home accompanied by the daughter.  She has home health PT, OT and RN.  No c/o pain or any discomfort noted. Hemodynamically stable.  Education: Patient and daughter was educated on discharge instructions, prescriptions, medications, follow up appointment and also given hand out on delirium.  Both understood and acknowledged.  IV: Removed Peripheral IV before discharge.   Telemetry: Not applicable.  Belongings: Patient took all her belongings with her.

## 2014-05-12 NOTE — Plan of Care (Signed)
Problem: Phase III Progression Outcomes Goal: Voiding independently Outcome: Completed/Met Date Met:  05/12/14 incontinent

## 2014-05-12 NOTE — Progress Notes (Signed)
Physical Therapy Treatment Patient Details Name: Teresa Callahan MRN: [id number] DOB: [birth year] Today's Date: 05/12/2014    History of Present Illness Admitted with confusion and UTI; Also getting Stroke workup    PT Comments    Pt's daughter requested PT come to discuss pt's current functional level and potential for DCing home instead of SNF.  Pt's mom has stated she does not want to go for SNF.  I saw pt. a second time for gait training with daughter observing.  Educated daughter that pt. Is needing min assist and it is my recommendation for 24 hour supervision/assist.  Daughter states pt's current function is not much different than when she DC'd from SNF and that she believes she can be handled in the home environment .  Pt. Has been having a hired care giver several hours in the am, and son and daughter plan to cover the rest of the time with their mom.  I have informed case management of daughter and pt's plan to DC home.  She will benefit from HHPT and daughter requests [place].  Pt. Has equipment in the home.  DC recommendation has been updated (see below)  Follow Up Recommendations  Home health PT;Supervision/Assistance - 24 hour;Supervision for mobility/OOB     Equipment Recommendations  None recommended by PT    Recommendations for Other Services       Precautions / Restrictions Precautions Precautions: Fall Restrictions Weight Bearing Restrictions: No    Mobility  Bed Mobility Overal bed mobility:  (not tested, pt. in recliner) Bed Mobility: Supine to Sit     Supine to sit: Min assist     General bed mobility comments: min assist and use of bedrail to elevate trunk to sitting postiion.  Pt. had been incontinent of urine .  Transfers Overall transfer level: Needs assistance Equipment used: Rolling walker (2 wheeled) Transfers: Sit to/from Stand Sit to Stand: Min assist         General transfer comment: min assist to rise to stand and control descent; vc's  for hand placement  Ambulation/Gait Ambulation/Gait assistance: Min assist Ambulation Distance (Feet): 80 Feet Assistive device: Rolling walker (2 wheeled) Gait Pattern/deviations: Step-through pattern;Trunk flexed;Decreased stride length Gait velocity: decreased   General Gait Details: Pt. tended to keep RW too far in front of her and needed cueing to step up into RW; min assist for safety and stablity   Stairs            Wheelchair Mobility    Modified Rankin (Stroke Patients Only)       Balance           Standing balance support: Bilateral upper extremity supported;During functional activity Standing balance-Leahy Scale: Poor (needs use of RW to support self in standing)                      Cognition Arousal/Alertness: Awake/alert Behavior During Therapy: WFL for tasks assessed/performed Overall Cognitive Status: History of cognitive impairments - at baseline Area of Impairment: Orientation;Safety/judgement;Awareness;Problem solving Orientation Level: Disoriented to;Situation;Time   Memory: Decreased short-term memory Following Commands: Follows one step commands consistently Safety/Judgement: Decreased awareness of deficits Awareness: Emergent Problem Solving: Slow processing;Decreased initiation General Comments: History of dementia    Exercises      General Comments        Pertinent Vitals/Pain Pain Assessment: No/denies pain    Home Living  Prior Function            PT Goals (current goals can now be found in the care plan section) Progress towards PT goals: Progressing toward goals    Frequency  Min 3X/week    PT Plan Discharge plan needs to be updated    Co-evaluation             End of Session Equipment Utilized During Treatment: Gait belt Activity Tolerance: Patient tolerated treatment well Patient left: in chair;with call bell/phone within reach;with chair alarm set;with  family/visitor present     Time: [id number] PT Time Calculation (min): 27 min  Charges:  $Gait Training: 8-22 mins $Therapeutic Activity: 8-22 mins $Self Care/Home Management: 8-22                    G Codes:      Ferman HammingBlankenship, Burnie Hank B 05/12/2014, 2:24 PM Weldon PickingSusan Teyanna Thielman PT Acute Rehab Services [phone] [place] [phone]

## 2014-05-12 NOTE — Care Management Note (Signed)
CARE MANAGEMENT NOTE 05/12/2014  Patient:  Teresa Callahan, Teresa Callahan   Account Number:  [id number]  Date Initiated:  05/12/2014  Documentation initiated by:  Arad Burston  Subjective/Objective Assessment:   CM following for progression and d/c planning.     Action/Plan:   05/12/2014 Met with pt and pt will return to SNF, IM given and explained to pt .   Anticipated DC Date:  05/12/2014   Anticipated DC Plan:  [place]         Choice offered to / List presented to:          [facility] arranged  HH-1 RN  [place]   Status of service:  Completed, signed off Medicare Important Message given?  YES (If response is "NO", the following Medicare IM given date fields will be blank) Date Medicare IM given:  05/12/2014 Medicare IM given by:  Anysha Frappier Date Additional Medicare IM given:   Additional Medicare IM given by:    Discharge Disposition:  [place]  Per UR Regulation:    If discussed at Long Length of Stay Meetings, dates discussed:    Comments:  05/12/2014 Original plan was to d/c to SNF however pt and daughter have decided to take pt home with [facility] services. Dr Brigitte Pulse notified and order received for [facility] services. FL2 has been signed and available if needed. Pt wishes to use [facility] for [facility] services as she has used that agency previously.  [facility] notified. Pt states that she has no DME needs. Jasmine Pang RN MPH, case manager, [phone]

## 2014-05-12 NOTE — Discharge Instructions (Signed)
Delirium °Delirium (acute confusional state) is a sudden change in a person's brain function that causes the person to become confused for a short period of time. People with delirium often have trouble knowing where they are. Delirium comes on very fast. It can develop in a few days or just a few hours. Delirium usually occurs because of another mental or physical condition. For example, a person might develop delirium after a surgery. It is especially common in elderly people who are sick or in the hospital. People with dementia (a brain disease like Alzheimer's) or people who are near death may also develop delirium.  °CAUSES  °Delirium occurs when something affects the signals that the brain sends out. These signals can be affected by anything that puts stress on the body and brain, causing brain chemicals to be out of balance. Structural health problems such as acute strokes, bleeding near the brain (intracranial bleeds), and trauma can also cause delirium. Sometimes the exact cause of delirium is not known. Usually, several factors contribute to the development of delirium. Things that may cause a person to develop delirium include: °· Surgery, especially when anesthetics are involved. °· Chronic medical conditions, such as chronic lung, heart, or kidney disease. °· Fever. °· Low body temperature (hypothermia). °· Infection, such as pneumonia, severe intestinal infection, severe skin infection, or urinary tract infection. °· Poor nutrition that leads to very low vitamin or protein levels in the body (malnutrition). °· Body fluid loss (dehydration). °· Low blood sugar. °· High blood sugar, which typically occurs in people with severe diabetes. °· Electrolyte abnormalities, such as sodium imbalance or acid-base disorders. °· Low oxygen level. °· Low blood pressure. °· Uncontrolled high blood pressure. °· Brain injury (trauma). °· Stroke. °· Abuse of alcohol or sudden withdrawal of alcohol. °· Sudden tobacco  withdrawal if the person is a longtime smoker. °· Loss of vision or hearing. °· Being strapped down (restrained). °· Being in a new setting, such as an elderly person being admitted to a hospital. °· Taking illegal drugs or quitting use of those drugs. °· Taking certain medicines for pain, sleep, allergies, high blood pressure, anxiety, depression, Parkinson disease, or seizures. °· Sundowning syndrome, which is a complication of chronic dementia that can occur in the later part of a person's wake cycle. °SYMPTOMS  °The main sign of delirium is a sudden change in a person's mental state. This change can come and go. Symptoms may include: °· Not being able to pay attention. °· Being confused about places, time, and people. °· Seeing, hearing, or feeling things that are not real (hallucinations). °· Changes in sleep patterns. °· Being restless, hyperactive, irritable, and angry. °· Extreme mood swings. °· Rambling and senseless talking. °· Difficulty speaking or understanding speech. °· Memory loss. °· Changes in consciousness, such as being sleepy, sluggish, lethargic, and withdrawn. °· Focusing on things or ideas that are not important. °· Unusual body movements or shaking (tremors). °DIAGNOSIS  °There are no specific tests for delirium, but a health care provider may do the following: °· Perform a mental status assessment. The health care provider will check for confusion and lack of awareness by talking with the person and asking questions. °· Talk with the person's friends and family. A friend or family member will often need to tell the health care provider about the person's symptoms and medical history, including medicines taken or missed. °· Perform a physical exam. The health care provider will perform a physical exam to check for underlying   conditions that may lead to delirium, such as dehydration, malnutrition, trauma, and infection. The exam may include checking for changes in vision, hearing, and the way  the person moves (coordination and reflexes). The health care provider may order tests, such as: °¨ Blood tests. °¨ Urine tests. °¨ Brain imaging, such as a CT scan or MRI scan. °¨ X-rays (to look for lung problems or intestinal blockage). °TREATMENT  °Treatment will focus on the cause of the delirium. Delirium is a sign of another problem. If that problem can be found and treated, the delirium may go away. Full recovery can take several weeks. Keeping the person safe until the cause can be found and treated (supportive care) is often what is needed. In some cases, medicine may be prescribed to help keep the person calm. People with delirium should not be left alone because they may involuntarily harm themselves. °HOME CARE INSTRUCTIONS  °Supportive care is provided by the person living with, or caring for, the person with delirium. It involves keeping the person safe, encouraging healthy habits, and helping the person stay aware of his or her surroundings. Take these steps to help care for someone with delirium: °· Make sure the person eats a healthy diet. °· Make sure the person gets enough fluids. The person should drink enough fluids to keep urine clear or pale yellow. °· Do not leave the person alone. °· Keep the person on a regular schedule. Maintain regular times for meals, sleeping, and being active. °· Keep the home as quiet and stress-free as possible. This is very important at night and will help improve the quality of the person's sleep. °· Let lots of sunlight into the home during the day. °· Avoid total darkness at night. °· Help the person maintain good hygiene to avoid skin infections, bed sores, and pressure ulcers. °· Do activities outside as often as possible. °· Take the person to see others whenever you can. °· Make sure the person uses hearing aids and eyeglasses if needed. °· Give frequent verbal reminders of the current time, location, and situation. °· Use memory cues, such as clocks,  calendars, and family photos. °· Try to keep the person calm. Music or relaxation techniques may be helpful. °· Always be on the lookout for symptoms of delirium. °· Do not use restraints. °· Only give over-the-counter or prescription medicines as directed by the health care provider. °· Make sure the person takes all medicines on a regular schedule as directed by the health care provider. °· Keep all follow-up appointments. °SEEK MEDICAL CARE IF: °· Any of the person's symptoms become worse. °· New signs of delirium develop. °· Caring for the person at home does not seem safe. °· The person stops eating, drinking, or communicating. °· The person develops vomiting. °SEEK IMMEDIATE MEDICAL CARE IF:  °· Symptoms of delirium do not go away after treatment. °· The person develops chest pain or shortness of breath. °· The person seems to want to harm someone or harm himself or herself. °MAKE SURE YOU:  °· Understand these instructions. °· Will watch the condition. °· Will get help right away if the person is not doing well or gets worse. °Document Released: 06/02/2012 Document Revised: 01/23/2014 Document Reviewed: 06/02/2012 °[employer]® Patient Information ©2015 [employer]. This information is not intended to replace advice given to you by your health care provider. Make sure you discuss any questions you have with your health care provider. ° °

## 2014-05-12 NOTE — Progress Notes (Signed)
Physical Therapy Treatment Patient Details Name: Teresa Callahan MRN: [id number] DOB: 12-15-1917 Today's Date: 05/12/2014    History of Present Illness Admitted with confusion and UTI; Also getting Stroke workup    PT Comments    Pt. Sleeping very soundly upon PT arrival.  Had been incontinent of urine.  Pt. Able to ambulate 40" with 2 assist following clean up.  Progressing with mobility.  Follow Up Recommendations  SNF;Supervision/Assistance - 24 hour     Equipment Recommendations  None recommended by PT    Recommendations for Other Services       Precautions / Restrictions Precautions Precautions: Fall Restrictions Weight Bearing Restrictions: No    Mobility  Bed Mobility Overal bed mobility: Needs Assistance Bed Mobility: Supine to Sit     Supine to sit: Min assist     General bed mobility comments: min assist and use of bedrail to elevate trunk to sitting postiion.  Pt. had been incontinent of urine .  Transfers Overall transfer level: Needs assistance Equipment used: Rolling walker (2 wheeled) Transfers: Sit to/from Stand Sit to Stand: Min assist;+2 physical assistance         General transfer comment: Pt. assisted to 3n1 for urination then cleaned and provided dry fresh gown .  Min assist of two to power up initially  Ambulation/Gait Ambulation/Gait assistance: Mod assist;+2 safety/equipment Ambulation Distance (Feet): 40 Feet Assistive device: Rolling walker (2 wheeled) Gait Pattern/deviations: Step-through pattern;Trunk flexed;Shuffle;Decreased stride length Gait velocity: decreased   General Gait Details: needed assist to manage and redirect RW, mod assist for balance and stability   Stairs            Wheelchair Mobility    Modified Rankin (Stroke Patients Only)       Balance                                    Cognition Arousal/Alertness: Awake/alert (initially sound asleep, awaakened to Pt interaction) Behavior  During Therapy: WFL for tasks assessed/performed Overall Cognitive Status: History of cognitive impairments - at baseline Area of Impairment: Orientation;Following commands;Safety/judgement;Awareness;Problem solving Orientation Level: Disoriented to;Situation;Time   Memory: Decreased short-term memory Following Commands: Follows one step commands inconsistently Safety/Judgement: Decreased awareness of deficits Awareness: Intellectual Problem Solving: Slow processing;Decreased initiation General Comments: History of dementia    Exercises      General Comments        Pertinent Vitals/Pain Pain Assessment: No/denies pain    Home Living                      Prior Function            PT Goals (current goals can now be found in the care plan section) Progress towards PT goals: Progressing toward goals    Frequency  Min 3X/week    PT Plan Current plan remains appropriate    Co-evaluation             End of Session Equipment Utilized During Treatment: Gait belt Activity Tolerance: Patient tolerated treatment well Patient left: in chair;with call bell/phone within reach;with chair alarm set     Time: 0812-0836 PT Time Calculation (min): 24 min  Charges:  $Gait Training: 8-22 mins $Therapeutic Activity: 8-22 mins                    G Codes:      Ferman HammingBlankenship, Hever Castilleja B 05/12/2014, 10:59  AM [facility] [phone] [place] [phone]

## 2014-05-16 LAB — CULTURE, BLOOD (ROUTINE X 2)
Culture: NO GROWTH
Culture: NO GROWTH

## 2014-07-24 ENCOUNTER — Encounter (HOSPITAL_COMMUNITY): Payer: Self-pay

## 2015-08-19 ENCOUNTER — Encounter (HOSPITAL_COMMUNITY): Payer: Self-pay | Admitting: *Deleted

## 2015-08-19 ENCOUNTER — Emergency Department (INDEPENDENT_AMBULATORY_CARE_PROVIDER_SITE_OTHER)
Admission: EM | Admit: 2015-08-19 | Discharge: 2015-08-19 | Disposition: A | Discharge status: Home / Self Care | Payer: Medicare Other | Source: Home / Self Care | Attending: Family Medicine | Admitting: Family Medicine

## 2015-08-19 DIAGNOSIS — H109 Unspecified conjunctivitis: Secondary | ICD-10-CM

## 2015-08-19 HISTORY — DX: Dorsalgia, unspecified: M54.9

## 2015-08-19 MED ORDER — ERYTHROMYCIN 5 MG/GM OP OINT: TOPICAL_OINTMENT | OPHTHALMIC | Status: DC

## 2015-08-19 MED ORDER — EYE WASH OPHTH SOLN
OPHTHALMIC | Status: AC
  Filled 2015-08-19: qty 118

## 2015-08-19 MED ORDER — FLUORESCEIN SODIUM 1 MG OP STRP
ORAL_STRIP | OPHTHALMIC | Status: AC
  Filled 2015-08-19: qty 1

## 2015-08-19 NOTE — ED Notes (Signed)
C/O left eye irritation since approx yesterday.  Left eye area swollen, red.  C/O sensation of foreign body, but no pain.  Has flushed with Visine multiple times.  Denies any change in vision.  Has had some slight drainage.  Earlier told family she felt like her face was swollen, but pt states that's improved.

## 2015-08-19 NOTE — Discharge Instructions (Signed)
It was nice to see you today. It seems you are having inflammation of your conjunctiva either from viral infection or allergy vs irritation from excessive rubbing of your left eye. Using the fluorescein die I was unable to visualize any foreign object in your cornea. I will recommend avoid touching or rubbing your eyes. Use prescribed topical antibiotic. If no improvement in 1-2 days please schedule follow up with your eye doctor.

## 2015-08-19 NOTE — ED Provider Notes (Addendum)
CSN: [id number]     Arrival date & time 08/19/15  1658 History   First MD Initiated Contact with Patient 08/19/15 1718     No chief complaint on file.  (Consider location/radiation/quality/duration/timing/severity/associated sxs/prior Treatment) Patient is a 81 y.o. female presenting with eye problem. The history is provided by the patient. No language interpreter was used.  Eye Problem Location:  L eye Quality: Irritation and itching in her left eye. Severity:  Mild Onset quality:  Sudden Duration:  1 day Timing:  Constant Progression:  Unchanged Context: not chemical exposure and not direct trauma   Context comment:  Feels like there is something in her eye. She was playing outside during thanksgiving holiday with her dogs running around and she felt something might have gotten into her eyes then, Relieved by:  Nothing Worsened by:  Nothing tried Ineffective treatments: Been using visin for eye irrigation. Associated symptoms: itching and redness   Associated symptoms: no blurred vision, no decreased vision, no discharge, no double vision, no photophobia and no tingling   Risk factors: no previous injury to eye and no recent URI   family house in [place], running around with dogs, feels like a speck that won't wash away. Denies any pain but some irritation. Some redness. No change in vision. She was reading a paper earlier today without any difficulty.  Past Medical History  Diagnosis Date  . UTI (lower urinary tract infection)   . Asthma   . Thyroid disease   . Diverticulosis   . Anemia   . Carotid stenosis   . Vertigo   . GERD (gastroesophageal reflux disease)   . Postmenopausal   . Atrophic vaginitis   . Osteoporosis   . Incontinence   . Pneumonia     "probably" (05/09/2014)  . Hypothyroidism   . History of blood transfusion 2014    "not related to a surgery"   Past Surgical History  Procedure Laterality Date  . Abdominal hysterectomy  1957  . Cataract extraction w/  intraocular lens  implant, bilateral Bilateral   . Colonoscopy  09/30/2001  . Tonsillectomy    . Eye surgery Left     d/t complications from cataract surgery   Family History  Problem Relation Age of Onset  . Colon cancer Mother   . Melanoma Father   . Lymphoma Son    Social History  Substance Use Topics  . Smoking status: Former Smoker -- 0.20 packs/day for 8 years    Types: Cigarettes    Quit date: 09/22/1948  . Smokeless tobacco: Never Used  . Alcohol Use: No   OB History    Gravida Para Term Preterm AB TAB SAB Ectopic Multiple Living   4 4        5      Review of Systems  Eyes: Positive for redness and itching. Negative for blurred vision, double vision, photophobia and discharge.  Respiratory: Negative.   Cardiovascular: Negative.   Neurological: Negative for tingling.  All other systems reviewed and are negative.   Allergies  Horse-derived products; Macrodantin; Premarin; and Sulfa antibiotics  Home Medications   Prior to Admission medications   Medication Sig Start Date End Date Taking? Authorizing Provider  acetaminophen (TYLENOL) 325 MG tablet Take 650 mg by mouth every 6 (six) hours as needed. Not to exceed 3000mg  daily    Historical Provider, MD  albuterol (PROVENTIL HFA;VENTOLIN HFA) 108 (90 BASE) MCG/ACT inhaler Inhale 1 puff into the lungs 2 (two) times daily as needed for wheezing  or shortness of breath.    Historical Provider, MD  calcium citrate (CALCITRATE - DOSED IN MG ELEMENTAL CALCIUM) 950 MG tablet Take 200 mg of elemental calcium by mouth daily.    Historical Provider, MD  esomeprazole (NEXIUM 24HR) 20 MG capsule Take 20 mg by mouth daily.    Historical Provider, MD  levothyroxine (SYNTHROID, LEVOTHROID) 75 MCG tablet Take 75 mcg by mouth daily.    Historical Provider, MD  polyethylene glycol (MIRALAX / GLYCOLAX) packet Take 17 g by mouth daily as needed (for constipation).    Historical Provider, MD  Polysaccharide Iron Complex (FERREX 150 PO) Take  150 mg by mouth daily.    Historical Provider, MD  Probiotic Product (ALIGN) 4 MG CAPS Take 1 capsule by mouth daily.    Historical Provider, MD  traMADol (ULTRAM) 50 MG tablet Take 50 mg by mouth every 6 (six) hours as needed for moderate pain.  09/17/13   Historical Provider, MD   Meds Ordered and Administered this Visit  Medications - No data to display  There were no vitals taken for this visit. No data found.   Physical Exam  Constitutional: She appears well-developed. No distress.  HENT:  Head: Normocephalic.  Eyes: EOM are normal. Pupils are equal, round, and reactive to light. Right conjunctiva is not injected. Right conjunctiva has no hemorrhage. Left conjunctiva is injected. Left conjunctiva has no hemorrhage.    Cardiovascular: Normal rate, regular rhythm and normal heart sounds.   No murmur heard. Pulmonary/Chest: Effort normal and breath sounds normal. No respiratory distress. She has no wheezes.  Nursing note and vitals reviewed.   ED Course  Procedures (including critical care time)  Labs Review Labs Reviewed - No data to display  Imaging Review No results found.   Visual Acuity Review  Right Eye Distance:   Left Eye Distance:   Bilateral Distance:    Right Eye Near:   Left Eye Near:    Bilateral Near:        Visual Acuity  Right Eye Distance: 20/30 (w/ glasses) Left Eye Distance: 20/70 (w/ glasses) Bilateral Distance: 20/30 (w/ glasses)  Right Eye Near:   Left Eye Near:    Bilateral Near:       MDM  No diagnosis found. Conjunctivitis of left eye  Viral vs allergic or irritant conjunctivitis given that she has been constantly rubbing her left eye and using OTC visine for eye irrigation. Patient reassured that FB was not seen. i recommended avoiding continuous rubbing of her eye which can make her irritation worse. I did some irrigation today at her request with minimal relieve of her symptoms. Prophylactic ophthalmic A/B ( Erythro) was  prescribed. Instruction given to see her ophthalmologist in 1-2 days for reassessment. She and her son agreed with plan. Return precaution discussed.      Doreene Eland, MD 08/19/15 [phone]

## 2015-10-23 DIAGNOSIS — N390 Urinary tract infection, site not specified: Secondary | ICD-10-CM | POA: Diagnosis not present

## 2015-10-31 DIAGNOSIS — N390 Urinary tract infection, site not specified: Secondary | ICD-10-CM | POA: Diagnosis not present

## 2015-10-31 DIAGNOSIS — M810 Age-related osteoporosis without current pathological fracture: Secondary | ICD-10-CM | POA: Diagnosis not present

## 2015-10-31 DIAGNOSIS — M549 Dorsalgia, unspecified: Secondary | ICD-10-CM | POA: Diagnosis not present

## 2015-10-31 DIAGNOSIS — E038 Other specified hypothyroidism: Secondary | ICD-10-CM | POA: Diagnosis not present

## 2015-10-31 DIAGNOSIS — J45909 Unspecified asthma, uncomplicated: Secondary | ICD-10-CM | POA: Diagnosis not present

## 2015-10-31 DIAGNOSIS — Z6822 Body mass index (BMI) 22.0-22.9, adult: Secondary | ICD-10-CM | POA: Diagnosis not present

## 2015-10-31 DIAGNOSIS — Z1389 Encounter for screening for other disorder: Secondary | ICD-10-CM | POA: Diagnosis not present

## 2016-01-28 DIAGNOSIS — N390 Urinary tract infection, site not specified: Secondary | ICD-10-CM | POA: Diagnosis not present

## 2016-03-05 DIAGNOSIS — R197 Diarrhea, unspecified: Secondary | ICD-10-CM | POA: Diagnosis not present

## 2016-03-05 DIAGNOSIS — E871 Hypo-osmolality and hyponatremia: Secondary | ICD-10-CM | POA: Diagnosis not present

## 2016-03-05 DIAGNOSIS — N390 Urinary tract infection, site not specified: Secondary | ICD-10-CM | POA: Diagnosis not present

## 2016-04-22 DIAGNOSIS — L22 Diaper dermatitis: Secondary | ICD-10-CM | POA: Diagnosis not present

## 2016-04-22 DIAGNOSIS — L209 Atopic dermatitis, unspecified: Secondary | ICD-10-CM | POA: Diagnosis not present

## 2016-04-22 DIAGNOSIS — Z6821 Body mass index (BMI) 21.0-21.9, adult: Secondary | ICD-10-CM | POA: Diagnosis not present

## 2016-04-29 DIAGNOSIS — E871 Hypo-osmolality and hyponatremia: Secondary | ICD-10-CM | POA: Diagnosis not present

## 2016-04-29 DIAGNOSIS — D509 Iron deficiency anemia, unspecified: Secondary | ICD-10-CM | POA: Diagnosis not present

## 2016-04-29 DIAGNOSIS — J45909 Unspecified asthma, uncomplicated: Secondary | ICD-10-CM | POA: Diagnosis not present

## 2016-04-29 DIAGNOSIS — E038 Other specified hypothyroidism: Secondary | ICD-10-CM | POA: Diagnosis not present

## 2016-04-29 DIAGNOSIS — M549 Dorsalgia, unspecified: Secondary | ICD-10-CM | POA: Diagnosis not present

## 2016-04-29 DIAGNOSIS — Z6821 Body mass index (BMI) 21.0-21.9, adult: Secondary | ICD-10-CM | POA: Diagnosis not present

## 2016-04-29 DIAGNOSIS — L209 Atopic dermatitis, unspecified: Secondary | ICD-10-CM | POA: Diagnosis not present

## 2016-10-24 DIAGNOSIS — E038 Other specified hypothyroidism: Secondary | ICD-10-CM | POA: Diagnosis not present

## 2016-10-24 DIAGNOSIS — D508 Other iron deficiency anemias: Secondary | ICD-10-CM | POA: Diagnosis not present

## 2016-10-24 DIAGNOSIS — R32 Unspecified urinary incontinence: Secondary | ICD-10-CM | POA: Diagnosis not present

## 2016-10-24 DIAGNOSIS — N390 Urinary tract infection, site not specified: Secondary | ICD-10-CM | POA: Diagnosis not present

## 2016-10-24 DIAGNOSIS — Z23 Encounter for immunization: Secondary | ICD-10-CM | POA: Diagnosis not present

## 2016-10-24 DIAGNOSIS — M810 Age-related osteoporosis without current pathological fracture: Secondary | ICD-10-CM | POA: Diagnosis not present

## 2016-10-24 DIAGNOSIS — J45909 Unspecified asthma, uncomplicated: Secondary | ICD-10-CM | POA: Diagnosis not present

## 2016-10-24 DIAGNOSIS — Z6821 Body mass index (BMI) 21.0-21.9, adult: Secondary | ICD-10-CM | POA: Diagnosis not present

## 2017-03-23 DIAGNOSIS — M79642 Pain in left hand: Secondary | ICD-10-CM | POA: Diagnosis not present

## 2017-03-23 DIAGNOSIS — Z6820 Body mass index (BMI) 20.0-20.9, adult: Secondary | ICD-10-CM | POA: Diagnosis not present

## 2017-03-23 DIAGNOSIS — S62613A Displaced fracture of proximal phalanx of left middle finger, initial encounter for closed fracture: Secondary | ICD-10-CM | POA: Diagnosis not present

## 2017-03-23 DIAGNOSIS — S62305A Unspecified fracture of fourth metacarpal bone, left hand, initial encounter for closed fracture: Secondary | ICD-10-CM | POA: Diagnosis not present

## 2017-03-23 DIAGNOSIS — S62325A Displaced fracture of shaft of fourth metacarpal bone, left hand, initial encounter for closed fracture: Secondary | ICD-10-CM | POA: Diagnosis not present

## 2017-03-26 DIAGNOSIS — S62305D Unspecified fracture of fourth metacarpal bone, left hand, subsequent encounter for fracture with routine healing: Secondary | ICD-10-CM | POA: Diagnosis not present

## 2017-04-13 DIAGNOSIS — S62305D Unspecified fracture of fourth metacarpal bone, left hand, subsequent encounter for fracture with routine healing: Secondary | ICD-10-CM | POA: Diagnosis not present

## 2017-04-14 DIAGNOSIS — Z9181 History of falling: Secondary | ICD-10-CM | POA: Diagnosis not present

## 2017-04-14 DIAGNOSIS — I6529 Occlusion and stenosis of unspecified carotid artery: Secondary | ICD-10-CM | POA: Diagnosis not present

## 2017-04-14 DIAGNOSIS — D509 Iron deficiency anemia, unspecified: Secondary | ICD-10-CM | POA: Diagnosis not present

## 2017-04-14 DIAGNOSIS — M859 Disorder of bone density and structure, unspecified: Secondary | ICD-10-CM | POA: Diagnosis not present

## 2017-04-14 DIAGNOSIS — S62325D Displaced fracture of shaft of fourth metacarpal bone, left hand, subsequent encounter for fracture with routine healing: Secondary | ICD-10-CM | POA: Diagnosis not present

## 2017-04-14 DIAGNOSIS — Z87891 Personal history of nicotine dependence: Secondary | ICD-10-CM | POA: Diagnosis not present

## 2017-04-14 DIAGNOSIS — E039 Hypothyroidism, unspecified: Secondary | ICD-10-CM | POA: Diagnosis not present

## 2017-04-14 DIAGNOSIS — E038 Other specified hypothyroidism: Secondary | ICD-10-CM | POA: Diagnosis not present

## 2017-04-14 DIAGNOSIS — D508 Other iron deficiency anemias: Secondary | ICD-10-CM | POA: Diagnosis not present

## 2017-04-14 DIAGNOSIS — M8580 Other specified disorders of bone density and structure, unspecified site: Secondary | ICD-10-CM | POA: Diagnosis not present

## 2017-04-14 DIAGNOSIS — Z87440 Personal history of urinary (tract) infections: Secondary | ICD-10-CM | POA: Diagnosis not present

## 2017-04-14 DIAGNOSIS — Z792 Long term (current) use of antibiotics: Secondary | ICD-10-CM | POA: Diagnosis not present

## 2017-04-14 DIAGNOSIS — S62613D Displaced fracture of proximal phalanx of left middle finger, subsequent encounter for fracture with routine healing: Secondary | ICD-10-CM | POA: Diagnosis not present

## 2017-04-16 DIAGNOSIS — M8580 Other specified disorders of bone density and structure, unspecified site: Secondary | ICD-10-CM | POA: Diagnosis not present

## 2017-04-16 DIAGNOSIS — E039 Hypothyroidism, unspecified: Secondary | ICD-10-CM | POA: Diagnosis not present

## 2017-04-16 DIAGNOSIS — D509 Iron deficiency anemia, unspecified: Secondary | ICD-10-CM | POA: Diagnosis not present

## 2017-04-16 DIAGNOSIS — S62325D Displaced fracture of shaft of fourth metacarpal bone, left hand, subsequent encounter for fracture with routine healing: Secondary | ICD-10-CM | POA: Diagnosis not present

## 2017-04-16 DIAGNOSIS — S62613D Displaced fracture of proximal phalanx of left middle finger, subsequent encounter for fracture with routine healing: Secondary | ICD-10-CM | POA: Diagnosis not present

## 2017-04-16 DIAGNOSIS — I6529 Occlusion and stenosis of unspecified carotid artery: Secondary | ICD-10-CM | POA: Diagnosis not present

## 2017-04-21 DIAGNOSIS — E039 Hypothyroidism, unspecified: Secondary | ICD-10-CM | POA: Diagnosis not present

## 2017-04-21 DIAGNOSIS — S62613D Displaced fracture of proximal phalanx of left middle finger, subsequent encounter for fracture with routine healing: Secondary | ICD-10-CM | POA: Diagnosis not present

## 2017-04-21 DIAGNOSIS — S62325D Displaced fracture of shaft of fourth metacarpal bone, left hand, subsequent encounter for fracture with routine healing: Secondary | ICD-10-CM | POA: Diagnosis not present

## 2017-04-21 DIAGNOSIS — M8580 Other specified disorders of bone density and structure, unspecified site: Secondary | ICD-10-CM | POA: Diagnosis not present

## 2017-04-21 DIAGNOSIS — I6529 Occlusion and stenosis of unspecified carotid artery: Secondary | ICD-10-CM | POA: Diagnosis not present

## 2017-04-21 DIAGNOSIS — D509 Iron deficiency anemia, unspecified: Secondary | ICD-10-CM | POA: Diagnosis not present

## 2017-04-23 DIAGNOSIS — M8580 Other specified disorders of bone density and structure, unspecified site: Secondary | ICD-10-CM | POA: Diagnosis not present

## 2017-04-23 DIAGNOSIS — E039 Hypothyroidism, unspecified: Secondary | ICD-10-CM | POA: Diagnosis not present

## 2017-04-23 DIAGNOSIS — D509 Iron deficiency anemia, unspecified: Secondary | ICD-10-CM | POA: Diagnosis not present

## 2017-04-23 DIAGNOSIS — S62613D Displaced fracture of proximal phalanx of left middle finger, subsequent encounter for fracture with routine healing: Secondary | ICD-10-CM | POA: Diagnosis not present

## 2017-04-23 DIAGNOSIS — I6529 Occlusion and stenosis of unspecified carotid artery: Secondary | ICD-10-CM | POA: Diagnosis not present

## 2017-04-23 DIAGNOSIS — S62325D Displaced fracture of shaft of fourth metacarpal bone, left hand, subsequent encounter for fracture with routine healing: Secondary | ICD-10-CM | POA: Diagnosis not present

## 2017-04-27 DIAGNOSIS — S62613D Displaced fracture of proximal phalanx of left middle finger, subsequent encounter for fracture with routine healing: Secondary | ICD-10-CM | POA: Diagnosis not present

## 2017-04-27 DIAGNOSIS — I6529 Occlusion and stenosis of unspecified carotid artery: Secondary | ICD-10-CM | POA: Diagnosis not present

## 2017-04-27 DIAGNOSIS — M8580 Other specified disorders of bone density and structure, unspecified site: Secondary | ICD-10-CM | POA: Diagnosis not present

## 2017-04-27 DIAGNOSIS — D509 Iron deficiency anemia, unspecified: Secondary | ICD-10-CM | POA: Diagnosis not present

## 2017-04-27 DIAGNOSIS — E039 Hypothyroidism, unspecified: Secondary | ICD-10-CM | POA: Diagnosis not present

## 2017-04-27 DIAGNOSIS — S62325D Displaced fracture of shaft of fourth metacarpal bone, left hand, subsequent encounter for fracture with routine healing: Secondary | ICD-10-CM | POA: Diagnosis not present

## 2017-04-29 DIAGNOSIS — E038 Other specified hypothyroidism: Secondary | ICD-10-CM | POA: Diagnosis not present

## 2017-04-29 DIAGNOSIS — S62613A Displaced fracture of proximal phalanx of left middle finger, initial encounter for closed fracture: Secondary | ICD-10-CM | POA: Diagnosis not present

## 2017-04-29 DIAGNOSIS — J45909 Unspecified asthma, uncomplicated: Secondary | ICD-10-CM | POA: Diagnosis not present

## 2017-04-29 DIAGNOSIS — M810 Age-related osteoporosis without current pathological fracture: Secondary | ICD-10-CM | POA: Diagnosis not present

## 2017-04-29 DIAGNOSIS — Z6820 Body mass index (BMI) 20.0-20.9, adult: Secondary | ICD-10-CM | POA: Diagnosis not present

## 2017-04-29 DIAGNOSIS — S62325S Displaced fracture of shaft of fourth metacarpal bone, left hand, sequela: Secondary | ICD-10-CM | POA: Diagnosis not present

## 2017-04-29 DIAGNOSIS — E871 Hypo-osmolality and hyponatremia: Secondary | ICD-10-CM | POA: Diagnosis not present

## 2017-04-29 DIAGNOSIS — D508 Other iron deficiency anemias: Secondary | ICD-10-CM | POA: Diagnosis not present

## 2017-04-30 DIAGNOSIS — D509 Iron deficiency anemia, unspecified: Secondary | ICD-10-CM | POA: Diagnosis not present

## 2017-04-30 DIAGNOSIS — E039 Hypothyroidism, unspecified: Secondary | ICD-10-CM | POA: Diagnosis not present

## 2017-04-30 DIAGNOSIS — S62325D Displaced fracture of shaft of fourth metacarpal bone, left hand, subsequent encounter for fracture with routine healing: Secondary | ICD-10-CM | POA: Diagnosis not present

## 2017-04-30 DIAGNOSIS — M8580 Other specified disorders of bone density and structure, unspecified site: Secondary | ICD-10-CM | POA: Diagnosis not present

## 2017-04-30 DIAGNOSIS — I6529 Occlusion and stenosis of unspecified carotid artery: Secondary | ICD-10-CM | POA: Diagnosis not present

## 2017-04-30 DIAGNOSIS — S62613D Displaced fracture of proximal phalanx of left middle finger, subsequent encounter for fracture with routine healing: Secondary | ICD-10-CM | POA: Diagnosis not present

## 2017-05-04 DIAGNOSIS — M8580 Other specified disorders of bone density and structure, unspecified site: Secondary | ICD-10-CM | POA: Diagnosis not present

## 2017-05-04 DIAGNOSIS — S62325D Displaced fracture of shaft of fourth metacarpal bone, left hand, subsequent encounter for fracture with routine healing: Secondary | ICD-10-CM | POA: Diagnosis not present

## 2017-05-04 DIAGNOSIS — D509 Iron deficiency anemia, unspecified: Secondary | ICD-10-CM | POA: Diagnosis not present

## 2017-05-04 DIAGNOSIS — E039 Hypothyroidism, unspecified: Secondary | ICD-10-CM | POA: Diagnosis not present

## 2017-05-04 DIAGNOSIS — S62613D Displaced fracture of proximal phalanx of left middle finger, subsequent encounter for fracture with routine healing: Secondary | ICD-10-CM | POA: Diagnosis not present

## 2017-05-04 DIAGNOSIS — I6529 Occlusion and stenosis of unspecified carotid artery: Secondary | ICD-10-CM | POA: Diagnosis not present

## 2017-05-07 DIAGNOSIS — D509 Iron deficiency anemia, unspecified: Secondary | ICD-10-CM | POA: Diagnosis not present

## 2017-05-07 DIAGNOSIS — M8580 Other specified disorders of bone density and structure, unspecified site: Secondary | ICD-10-CM | POA: Diagnosis not present

## 2017-05-07 DIAGNOSIS — E039 Hypothyroidism, unspecified: Secondary | ICD-10-CM | POA: Diagnosis not present

## 2017-05-07 DIAGNOSIS — S62325D Displaced fracture of shaft of fourth metacarpal bone, left hand, subsequent encounter for fracture with routine healing: Secondary | ICD-10-CM | POA: Diagnosis not present

## 2017-05-07 DIAGNOSIS — S62613D Displaced fracture of proximal phalanx of left middle finger, subsequent encounter for fracture with routine healing: Secondary | ICD-10-CM | POA: Diagnosis not present

## 2017-05-07 DIAGNOSIS — I6529 Occlusion and stenosis of unspecified carotid artery: Secondary | ICD-10-CM | POA: Diagnosis not present

## 2017-05-11 DIAGNOSIS — S62613D Displaced fracture of proximal phalanx of left middle finger, subsequent encounter for fracture with routine healing: Secondary | ICD-10-CM | POA: Diagnosis not present

## 2017-05-11 DIAGNOSIS — I6529 Occlusion and stenosis of unspecified carotid artery: Secondary | ICD-10-CM | POA: Diagnosis not present

## 2017-05-11 DIAGNOSIS — M8580 Other specified disorders of bone density and structure, unspecified site: Secondary | ICD-10-CM | POA: Diagnosis not present

## 2017-05-11 DIAGNOSIS — S62305D Unspecified fracture of fourth metacarpal bone, left hand, subsequent encounter for fracture with routine healing: Secondary | ICD-10-CM | POA: Diagnosis not present

## 2017-05-11 DIAGNOSIS — D509 Iron deficiency anemia, unspecified: Secondary | ICD-10-CM | POA: Diagnosis not present

## 2017-05-11 DIAGNOSIS — E039 Hypothyroidism, unspecified: Secondary | ICD-10-CM | POA: Diagnosis not present

## 2017-05-11 DIAGNOSIS — S62325D Displaced fracture of shaft of fourth metacarpal bone, left hand, subsequent encounter for fracture with routine healing: Secondary | ICD-10-CM | POA: Diagnosis not present

## 2017-05-13 DIAGNOSIS — M8580 Other specified disorders of bone density and structure, unspecified site: Secondary | ICD-10-CM | POA: Diagnosis not present

## 2017-05-13 DIAGNOSIS — S62325D Displaced fracture of shaft of fourth metacarpal bone, left hand, subsequent encounter for fracture with routine healing: Secondary | ICD-10-CM | POA: Diagnosis not present

## 2017-05-13 DIAGNOSIS — E039 Hypothyroidism, unspecified: Secondary | ICD-10-CM | POA: Diagnosis not present

## 2017-05-13 DIAGNOSIS — S62613D Displaced fracture of proximal phalanx of left middle finger, subsequent encounter for fracture with routine healing: Secondary | ICD-10-CM | POA: Diagnosis not present

## 2017-05-13 DIAGNOSIS — D509 Iron deficiency anemia, unspecified: Secondary | ICD-10-CM | POA: Diagnosis not present

## 2017-05-13 DIAGNOSIS — I6529 Occlusion and stenosis of unspecified carotid artery: Secondary | ICD-10-CM | POA: Diagnosis not present

## 2017-05-18 DIAGNOSIS — S62325D Displaced fracture of shaft of fourth metacarpal bone, left hand, subsequent encounter for fracture with routine healing: Secondary | ICD-10-CM | POA: Diagnosis not present

## 2017-05-18 DIAGNOSIS — I6529 Occlusion and stenosis of unspecified carotid artery: Secondary | ICD-10-CM | POA: Diagnosis not present

## 2017-05-18 DIAGNOSIS — E039 Hypothyroidism, unspecified: Secondary | ICD-10-CM | POA: Diagnosis not present

## 2017-05-18 DIAGNOSIS — D509 Iron deficiency anemia, unspecified: Secondary | ICD-10-CM | POA: Diagnosis not present

## 2017-05-18 DIAGNOSIS — M8580 Other specified disorders of bone density and structure, unspecified site: Secondary | ICD-10-CM | POA: Diagnosis not present

## 2017-05-18 DIAGNOSIS — S62613D Displaced fracture of proximal phalanx of left middle finger, subsequent encounter for fracture with routine healing: Secondary | ICD-10-CM | POA: Diagnosis not present

## 2017-05-21 DIAGNOSIS — E039 Hypothyroidism, unspecified: Secondary | ICD-10-CM | POA: Diagnosis not present

## 2017-05-21 DIAGNOSIS — S62613D Displaced fracture of proximal phalanx of left middle finger, subsequent encounter for fracture with routine healing: Secondary | ICD-10-CM | POA: Diagnosis not present

## 2017-05-21 DIAGNOSIS — M8580 Other specified disorders of bone density and structure, unspecified site: Secondary | ICD-10-CM | POA: Diagnosis not present

## 2017-05-21 DIAGNOSIS — D509 Iron deficiency anemia, unspecified: Secondary | ICD-10-CM | POA: Diagnosis not present

## 2017-05-21 DIAGNOSIS — I6529 Occlusion and stenosis of unspecified carotid artery: Secondary | ICD-10-CM | POA: Diagnosis not present

## 2017-05-21 DIAGNOSIS — S62325D Displaced fracture of shaft of fourth metacarpal bone, left hand, subsequent encounter for fracture with routine healing: Secondary | ICD-10-CM | POA: Diagnosis not present

## 2017-05-26 DIAGNOSIS — S62613D Displaced fracture of proximal phalanx of left middle finger, subsequent encounter for fracture with routine healing: Secondary | ICD-10-CM | POA: Diagnosis not present

## 2017-05-26 DIAGNOSIS — E039 Hypothyroidism, unspecified: Secondary | ICD-10-CM | POA: Diagnosis not present

## 2017-05-26 DIAGNOSIS — M8580 Other specified disorders of bone density and structure, unspecified site: Secondary | ICD-10-CM | POA: Diagnosis not present

## 2017-05-26 DIAGNOSIS — D509 Iron deficiency anemia, unspecified: Secondary | ICD-10-CM | POA: Diagnosis not present

## 2017-05-26 DIAGNOSIS — S62325D Displaced fracture of shaft of fourth metacarpal bone, left hand, subsequent encounter for fracture with routine healing: Secondary | ICD-10-CM | POA: Diagnosis not present

## 2017-05-26 DIAGNOSIS — I6529 Occlusion and stenosis of unspecified carotid artery: Secondary | ICD-10-CM | POA: Diagnosis not present

## 2017-05-29 DIAGNOSIS — I6529 Occlusion and stenosis of unspecified carotid artery: Secondary | ICD-10-CM | POA: Diagnosis not present

## 2017-05-29 DIAGNOSIS — S62325D Displaced fracture of shaft of fourth metacarpal bone, left hand, subsequent encounter for fracture with routine healing: Secondary | ICD-10-CM | POA: Diagnosis not present

## 2017-05-29 DIAGNOSIS — D509 Iron deficiency anemia, unspecified: Secondary | ICD-10-CM | POA: Diagnosis not present

## 2017-05-29 DIAGNOSIS — M8580 Other specified disorders of bone density and structure, unspecified site: Secondary | ICD-10-CM | POA: Diagnosis not present

## 2017-05-29 DIAGNOSIS — E039 Hypothyroidism, unspecified: Secondary | ICD-10-CM | POA: Diagnosis not present

## 2017-05-29 DIAGNOSIS — S62613D Displaced fracture of proximal phalanx of left middle finger, subsequent encounter for fracture with routine healing: Secondary | ICD-10-CM | POA: Diagnosis not present

## 2017-06-01 DIAGNOSIS — M8580 Other specified disorders of bone density and structure, unspecified site: Secondary | ICD-10-CM | POA: Diagnosis not present

## 2017-06-01 DIAGNOSIS — S62613D Displaced fracture of proximal phalanx of left middle finger, subsequent encounter for fracture with routine healing: Secondary | ICD-10-CM | POA: Diagnosis not present

## 2017-06-01 DIAGNOSIS — E039 Hypothyroidism, unspecified: Secondary | ICD-10-CM | POA: Diagnosis not present

## 2017-06-01 DIAGNOSIS — S62325D Displaced fracture of shaft of fourth metacarpal bone, left hand, subsequent encounter for fracture with routine healing: Secondary | ICD-10-CM | POA: Diagnosis not present

## 2017-06-01 DIAGNOSIS — I6529 Occlusion and stenosis of unspecified carotid artery: Secondary | ICD-10-CM | POA: Diagnosis not present

## 2017-06-01 DIAGNOSIS — D509 Iron deficiency anemia, unspecified: Secondary | ICD-10-CM | POA: Diagnosis not present

## 2017-06-04 DIAGNOSIS — S62325D Displaced fracture of shaft of fourth metacarpal bone, left hand, subsequent encounter for fracture with routine healing: Secondary | ICD-10-CM | POA: Diagnosis not present

## 2017-06-04 DIAGNOSIS — D509 Iron deficiency anemia, unspecified: Secondary | ICD-10-CM | POA: Diagnosis not present

## 2017-06-04 DIAGNOSIS — S62613D Displaced fracture of proximal phalanx of left middle finger, subsequent encounter for fracture with routine healing: Secondary | ICD-10-CM | POA: Diagnosis not present

## 2017-06-04 DIAGNOSIS — E039 Hypothyroidism, unspecified: Secondary | ICD-10-CM | POA: Diagnosis not present

## 2017-06-04 DIAGNOSIS — I6529 Occlusion and stenosis of unspecified carotid artery: Secondary | ICD-10-CM | POA: Diagnosis not present

## 2017-06-04 DIAGNOSIS — M8580 Other specified disorders of bone density and structure, unspecified site: Secondary | ICD-10-CM | POA: Diagnosis not present

## 2017-06-08 DIAGNOSIS — S62613D Displaced fracture of proximal phalanx of left middle finger, subsequent encounter for fracture with routine healing: Secondary | ICD-10-CM | POA: Diagnosis not present

## 2017-06-08 DIAGNOSIS — M8580 Other specified disorders of bone density and structure, unspecified site: Secondary | ICD-10-CM | POA: Diagnosis not present

## 2017-06-08 DIAGNOSIS — E039 Hypothyroidism, unspecified: Secondary | ICD-10-CM | POA: Diagnosis not present

## 2017-06-08 DIAGNOSIS — I6529 Occlusion and stenosis of unspecified carotid artery: Secondary | ICD-10-CM | POA: Diagnosis not present

## 2017-06-08 DIAGNOSIS — S62325D Displaced fracture of shaft of fourth metacarpal bone, left hand, subsequent encounter for fracture with routine healing: Secondary | ICD-10-CM | POA: Diagnosis not present

## 2017-06-08 DIAGNOSIS — D509 Iron deficiency anemia, unspecified: Secondary | ICD-10-CM | POA: Diagnosis not present

## 2017-06-10 DIAGNOSIS — S62305D Unspecified fracture of fourth metacarpal bone, left hand, subsequent encounter for fracture with routine healing: Secondary | ICD-10-CM | POA: Diagnosis not present

## 2017-06-11 DIAGNOSIS — I6529 Occlusion and stenosis of unspecified carotid artery: Secondary | ICD-10-CM | POA: Diagnosis not present

## 2017-06-11 DIAGNOSIS — E039 Hypothyroidism, unspecified: Secondary | ICD-10-CM | POA: Diagnosis not present

## 2017-06-11 DIAGNOSIS — S62325D Displaced fracture of shaft of fourth metacarpal bone, left hand, subsequent encounter for fracture with routine healing: Secondary | ICD-10-CM | POA: Diagnosis not present

## 2017-06-11 DIAGNOSIS — S62613D Displaced fracture of proximal phalanx of left middle finger, subsequent encounter for fracture with routine healing: Secondary | ICD-10-CM | POA: Diagnosis not present

## 2017-06-11 DIAGNOSIS — D509 Iron deficiency anemia, unspecified: Secondary | ICD-10-CM | POA: Diagnosis not present

## 2017-06-11 DIAGNOSIS — M8580 Other specified disorders of bone density and structure, unspecified site: Secondary | ICD-10-CM | POA: Diagnosis not present

## 2017-07-14 DIAGNOSIS — Z23 Encounter for immunization: Secondary | ICD-10-CM | POA: Diagnosis not present

## 2017-10-31 ENCOUNTER — Inpatient Hospital Stay (HOSPITAL_COMMUNITY)
Admission: EM | Admit: 2017-10-31 | Discharge: 2017-11-04 | DRG: 481 | Disposition: A | Discharge status: Skilled Nursing Facility | Payer: Medicare Other | Attending: Internal Medicine | Admitting: Internal Medicine

## 2017-10-31 ENCOUNTER — Other Ambulatory Visit: Payer: Self-pay

## 2017-10-31 ENCOUNTER — Encounter (HOSPITAL_COMMUNITY): Payer: Self-pay | Admitting: Emergency Medicine

## 2017-10-31 ENCOUNTER — Ambulatory Visit (INDEPENDENT_AMBULATORY_CARE_PROVIDER_SITE_OTHER): Payer: Medicare Other

## 2017-10-31 ENCOUNTER — Emergency Department (HOSPITAL_COMMUNITY): Payer: Medicare Other

## 2017-10-31 ENCOUNTER — Ambulatory Visit (HOSPITAL_COMMUNITY)
Admission: EM | Admit: 2017-10-31 | Discharge: 2017-10-31 | Disposition: A | Discharge status: Home / Self Care | Payer: Medicare Other | Attending: Physician Assistant | Admitting: Physician Assistant

## 2017-10-31 ENCOUNTER — Encounter (HOSPITAL_COMMUNITY): Payer: Self-pay | Admitting: *Deleted

## 2017-10-31 DIAGNOSIS — Y92012 Bathroom of single-family (private) house as the place of occurrence of the external cause: Secondary | ICD-10-CM

## 2017-10-31 DIAGNOSIS — S72145A Nondisplaced intertrochanteric fracture of left femur, initial encounter for closed fracture: Secondary | ICD-10-CM

## 2017-10-31 DIAGNOSIS — Z87891 Personal history of nicotine dependence: Secondary | ICD-10-CM | POA: Diagnosis not present

## 2017-10-31 DIAGNOSIS — W010XXA Fall on same level from slipping, tripping and stumbling without subsequent striking against object, initial encounter: Secondary | ICD-10-CM | POA: Diagnosis present

## 2017-10-31 DIAGNOSIS — S62665A Nondisplaced fracture of distal phalanx of left ring finger, initial encounter for closed fracture: Secondary | ICD-10-CM | POA: Diagnosis present

## 2017-10-31 DIAGNOSIS — N390 Urinary tract infection, site not specified: Secondary | ICD-10-CM | POA: Diagnosis not present

## 2017-10-31 DIAGNOSIS — I9581 Postprocedural hypotension: Secondary | ICD-10-CM | POA: Diagnosis not present

## 2017-10-31 DIAGNOSIS — D62 Acute posthemorrhagic anemia: Secondary | ICD-10-CM | POA: Diagnosis not present

## 2017-10-31 DIAGNOSIS — S72012A Unspecified intracapsular fracture of left femur, initial encounter for closed fracture: Secondary | ICD-10-CM | POA: Diagnosis not present

## 2017-10-31 DIAGNOSIS — W19XXXA Unspecified fall, initial encounter: Secondary | ICD-10-CM

## 2017-10-31 DIAGNOSIS — S6992XA Unspecified injury of left wrist, hand and finger(s), initial encounter: Secondary | ICD-10-CM | POA: Diagnosis not present

## 2017-10-31 DIAGNOSIS — M25552 Pain in left hip: Secondary | ICD-10-CM | POA: Diagnosis not present

## 2017-10-31 DIAGNOSIS — S0083XA Contusion of other part of head, initial encounter: Secondary | ICD-10-CM | POA: Diagnosis not present

## 2017-10-31 DIAGNOSIS — F801 Expressive language disorder: Secondary | ICD-10-CM | POA: Diagnosis not present

## 2017-10-31 DIAGNOSIS — Z9181 History of falling: Secondary | ICD-10-CM | POA: Diagnosis not present

## 2017-10-31 DIAGNOSIS — Z90710 Acquired absence of both cervix and uterus: Secondary | ICD-10-CM

## 2017-10-31 DIAGNOSIS — M810 Age-related osteoporosis without current pathological fracture: Secondary | ICD-10-CM | POA: Diagnosis not present

## 2017-10-31 DIAGNOSIS — I6529 Occlusion and stenosis of unspecified carotid artery: Secondary | ICD-10-CM | POA: Diagnosis not present

## 2017-10-31 DIAGNOSIS — S72002A Fracture of unspecified part of neck of left femur, initial encounter for closed fracture: Secondary | ICD-10-CM | POA: Diagnosis present

## 2017-10-31 DIAGNOSIS — E039 Hypothyroidism, unspecified: Secondary | ICD-10-CM | POA: Diagnosis not present

## 2017-10-31 DIAGNOSIS — S0990XA Unspecified injury of head, initial encounter: Secondary | ICD-10-CM

## 2017-10-31 DIAGNOSIS — J45909 Unspecified asthma, uncomplicated: Secondary | ICD-10-CM | POA: Diagnosis not present

## 2017-10-31 DIAGNOSIS — G8911 Acute pain due to trauma: Secondary | ICD-10-CM | POA: Diagnosis not present

## 2017-10-31 DIAGNOSIS — I4510 Unspecified right bundle-branch block: Secondary | ICD-10-CM | POA: Diagnosis not present

## 2017-10-31 DIAGNOSIS — S72002D Fracture of unspecified part of neck of left femur, subsequent encounter for closed fracture with routine healing: Secondary | ICD-10-CM | POA: Diagnosis not present

## 2017-10-31 DIAGNOSIS — S299XXA Unspecified injury of thorax, initial encounter: Secondary | ICD-10-CM | POA: Diagnosis not present

## 2017-10-31 DIAGNOSIS — R34 Anuria and oliguria: Secondary | ICD-10-CM | POA: Diagnosis present

## 2017-10-31 DIAGNOSIS — Z87440 Personal history of urinary (tract) infections: Secondary | ICD-10-CM | POA: Diagnosis not present

## 2017-10-31 DIAGNOSIS — S199XXA Unspecified injury of neck, initial encounter: Secondary | ICD-10-CM | POA: Diagnosis not present

## 2017-10-31 DIAGNOSIS — K219 Gastro-esophageal reflux disease without esophagitis: Secondary | ICD-10-CM | POA: Diagnosis present

## 2017-10-31 DIAGNOSIS — Z79890 Hormone replacement therapy (postmenopausal): Secondary | ICD-10-CM | POA: Diagnosis not present

## 2017-10-31 DIAGNOSIS — J189 Pneumonia, unspecified organism: Secondary | ICD-10-CM | POA: Diagnosis not present

## 2017-10-31 DIAGNOSIS — S72032A Displaced midcervical fracture of left femur, initial encounter for closed fracture: Secondary | ICD-10-CM | POA: Diagnosis not present

## 2017-10-31 DIAGNOSIS — T148XXA Other injury of unspecified body region, initial encounter: Secondary | ICD-10-CM

## 2017-10-31 DIAGNOSIS — S79911A Unspecified injury of right hip, initial encounter: Secondary | ICD-10-CM | POA: Diagnosis not present

## 2017-10-31 LAB — COMPREHENSIVE METABOLIC PANEL
ALBUMIN: 4 g/dL (ref 3.5–5.0)
ALK PHOS: 65 U/L (ref 38–126)
ALT: 19 U/L (ref 14–54)
AST: 27 U/L (ref 15–41)
Anion gap: 13 (ref 5–15)
BUN: 18 mg/dL (ref 6–20)
CO2: 20 mmol/L — AB (ref 22–32)
Calcium: 9.5 mg/dL (ref 8.9–10.3)
Chloride: 102 mmol/L (ref 101–111)
Creatinine, Ser: 0.79 mg/dL (ref 0.44–1.00)
GFR calc Af Amer: >60 mL/min (ref >60)
GFR calc non Af Amer: >60 mL/min (ref >60)
GLUCOSE: 109 mg/dL — AB (ref 65–99)
Potassium: 4.1 mmol/L (ref 3.5–5.1)
SODIUM: 135 mmol/L (ref 135–145)
Total Bilirubin: 2.1 mg/dL — ABNORMAL HIGH (ref 0.3–1.2)
Total Protein: 7.1 g/dL (ref 6.5–8.1)

## 2017-10-31 LAB — CBC
HCT: 39.4 % (ref 36.0–46.0)
Hemoglobin: 13.5 g/dL (ref 12.0–15.0)
MCH: 32.2 pg (ref 26.0–34.0)
MCHC: 34.3 g/dL (ref 30.0–36.0)
MCV: 94 fL (ref 78.0–100.0)
Platelets: 244 10*3/uL (ref 150–400)
RBC: 4.19 MIL/uL (ref 3.87–5.11)
RDW: 15.4 % (ref 11.5–15.5)
WBC: 9.6 10*3/uL (ref 4.0–10.5)

## 2017-10-31 LAB — PROTIME-INR
INR: 0.97
Prothrombin Time: 12.8 seconds (ref 11.4–15.2)

## 2017-10-31 LAB — APTT: APTT: 35 s (ref 24–36)

## 2017-10-31 LAB — SAMPLE TO BLOOD BANK

## 2017-10-31 MED ORDER — MORPHINE SULFATE (PF) 4 MG/ML IV SOLN: 1.0000 mg | INTRAVENOUS | Status: DC | PRN

## 2017-10-31 MED ORDER — ALBUTEROL SULFATE (2.5 MG/3ML) 0.083% IN NEBU: 3.0000 mL | INHALATION_SOLUTION | Freq: Two times a day (BID) | RESPIRATORY_TRACT | Status: DC | PRN

## 2017-10-31 MED ORDER — ACETAMINOPHEN 650 MG RE SUPP: 650.0000 mg | Freq: Four times a day (QID) | RECTAL | Status: DC | PRN

## 2017-10-31 MED ORDER — LEVOTHYROXINE SODIUM 75 MCG PO TABS
75.0000 ug | ORAL_TABLET | Freq: Every day | ORAL | Status: DC
  Administered 2017-11-01 – 2017-11-04 (×4): 75 ug via ORAL
  Filled 2017-10-31 (×4): qty 1

## 2017-10-31 MED ORDER — ONDANSETRON HCL 4 MG/2ML IJ SOLN
4.0000 mg | Freq: Four times a day (QID) | INTRAMUSCULAR | Status: DC | PRN
  Administered 2017-11-01: 4 mg via INTRAVENOUS

## 2017-10-31 MED ORDER — DOXYCYCLINE MONOHYDRATE 100 MG PO TABS: 100.0000 mg | ORAL_TABLET | Freq: Every day | ORAL | Status: DC

## 2017-10-31 MED ORDER — ENOXAPARIN SODIUM 30 MG/0.3ML ~~LOC~~ SOLN
30.0000 mg | SUBCUTANEOUS | Status: DC
  Administered 2017-11-01 – 2017-11-03 (×3): 30 mg via SUBCUTANEOUS
  Filled 2017-10-31 (×3): qty 0.3

## 2017-10-31 MED ORDER — DOXYCYCLINE HYCLATE 100 MG PO TABS
100.0000 mg | ORAL_TABLET | Freq: Every day | ORAL | Status: DC
  Administered 2017-11-01 – 2017-11-04 (×4): 100 mg via ORAL
  Filled 2017-10-31 (×4): qty 1

## 2017-10-31 MED ORDER — SODIUM CHLORIDE 0.9 % IV SOLN
INTRAVENOUS | Status: DC
  Administered 2017-10-31 – 2017-11-02 (×2): via INTRAVENOUS

## 2017-10-31 MED ORDER — ACETAMINOPHEN 325 MG PO TABS
650.0000 mg | ORAL_TABLET | Freq: Four times a day (QID) | ORAL | Status: DC | PRN
  Administered 2017-10-31 – 2017-11-04 (×3): 650 mg via ORAL
  Filled 2017-10-31 (×3): qty 2

## 2017-10-31 MED ORDER — LOPERAMIDE HCL 2 MG PO CAPS
2.0000 mg | ORAL_CAPSULE | ORAL | Status: DC | PRN
  Administered 2017-10-31 – 2017-11-01 (×3): 2 mg via ORAL
  Filled 2017-10-31 (×3): qty 1

## 2017-10-31 NOTE — ED Notes (Signed)
Patient transported to X-ray 

## 2017-10-31 NOTE — ED Notes (Signed)
Pt had large watery, brown diarrhea movement. Pt changed and cleaned up. Called RN Harriett Sineancy to make her aware.

## 2017-10-31 NOTE — ED Notes (Signed)
Dr. Selena BattenKim at bedside. To call report to 6N.

## 2017-10-31 NOTE — ED Provider Notes (Signed)
[facility]    CSN: [id number] Arrival date & time: 10/31/17  1238     History   Chief Complaint Chief Complaint  Patient presents with  . Fall    HPI Teresa Callahan is a 84 y.o. female.   Who presents following a fall yesterday morning. She does not remember exactly how she fell but denies a syncopal episode. No LOC. She did hit on the left side of her head and left hip region. She has not noted any dizziness, N, V or change in mental status. Her head is "sore", but she has not noted any swelling. She is not on anti-coags. She reports pain in her left groin when trying to walk or stand.       Past Medical History:  Diagnosis Date  . Anemia   . Asthma   . Atrophic vaginitis   . Back pain   . Carotid stenosis   . Diverticulosis   . GERD (gastroesophageal reflux disease)   . History of blood transfusion 2014   "not related to a surgery"  . Hypothyroidism   . Incontinence   . Osteoporosis   . Pneumonia    "probably" (05/09/2014)  . Postmenopausal   . Thyroid disease   . UTI (lower urinary tract infection)   . Vertigo     Patient Active Problem List   Diagnosis Date Noted  . Hyponatremia 05/12/2014  . Expressive aphasia 05/10/2014  . UTI (urinary tract infection) 05/09/2014  . Altered mental status 05/09/2014  . Carotid stenosis 05/09/2014  . Acute renal failure (HCC) 05/09/2014  . Acute delirium 05/09/2014  . Unspecified hypothyroidism 10/05/2013  . Asthma, chronic 02/17/2013  . Postural dizziness 02/17/2013  . Pulmonary infiltrates 11/24/2011  . Chronic UTI 11/24/2011  . ANGIODYSPLASIA-INTESTINE 06/25/2010  . ANEMIA, IRON DEFICIENCY 05/07/2010  . FECAL OCCULT BLOOD 05/07/2010    Past Surgical History:  Procedure Laterality Date  . ABDOMINAL HYSTERECTOMY  1957  . CATARACT EXTRACTION W/ INTRAOCULAR LENS  IMPLANT, BILATERAL Bilateral   . COLONOSCOPY  09/30/2001  . EYE SURGERY Left    d/t complications from cataract surgery  . TONSILLECTOMY        OB History    Gravida Para Term Preterm AB Living   4 4       5    SAB TAB Ectopic Multiple Live Births                   Home Medications    Prior to Admission medications   Medication Sig Start Date End Date Taking? Authorizing Provider  calcium citrate (CALCITRATE - DOSED IN MG ELEMENTAL CALCIUM) 950 MG tablet Take 200 mg of elemental calcium by mouth daily.   Yes [provider]  doxycycline (ADOXA) 100 MG tablet Take 100 mg by mouth daily.   Yes [provider]  levothyroxine (SYNTHROID, LEVOTHROID) 75 MCG tablet Take 75 mcg by mouth daily.   Yes [provider]  Probiotic Product (ALIGN) 4 MG CAPS Take 1 capsule by mouth daily.   Yes [provider]  acetaminophen (TYLENOL) 325 MG tablet Take 650 mg by mouth every 6 (six) hours as needed. Not to exceed 3000mg  daily    [provider]  albuterol (PROVENTIL HFA;VENTOLIN HFA) 108 (90 BASE) MCG/ACT inhaler Inhale 1 puff into the lungs 2 (two) times daily as needed for wheezing or shortness of breath.    [provider]  ciprofloxacin (CIPRO) 250 MG tablet Take 250 mg by mouth 2 (  two) times daily. **Pt just filled; has not started yet    [provider]  erythromycin ophthalmic ointment Place a 1/2 inch ribbon of ointment into the lower eyelid TID for 5 days 08/19/15   Janit Pagan T, MD  esomeprazole (NEXIUM 24HR) 20 MG capsule Take 20 mg by mouth daily.    [provider]  Phenazopyridine HCl (AZO TABS PO) Take by mouth.    [provider]  polyethylene glycol (MIRALAX / GLYCOLAX) packet Take 17 g by mouth daily as needed (for constipation).    [provider]  Polysaccharide Iron Complex (FERREX 150 PO) Take 150 mg by mouth daily.    [provider]  traMADol (ULTRAM) 50 MG tablet Take 50 mg by mouth every 6 (six) hours as needed for moderate pain.  09/17/13   [provider]    Family History Family History  Problem  Relation Age of Onset  . Colon cancer Mother   . Melanoma Father   . Lymphoma Son     Social History Social History   Tobacco Use  . Smoking status: Former Smoker    Packs/day: 0.20    Years: 8.00    Pack years: 1.60    Types: Cigarettes    Last attempt to quit: 09/22/1948    Years since quitting: 69.1  . Smokeless tobacco: Never Used  Substance Use Topics  . Alcohol use: Yes    Comment: 1 drink daily  . Drug use: No     Allergies   Horse-derived products; Macrodantin; Premarin [estrogens, conjugated]; and Sulfa antibiotics   Review of Systems Review of Systems  Constitutional: Negative.   HENT: Negative for facial swelling.   Respiratory: Negative for shortness of breath.   Musculoskeletal: Positive for arthralgias.       Left groin pain  Neurological: Negative for dizziness, tremors, syncope, facial asymmetry, speech difficulty, weakness, light-headedness, numbness and headaches.  Hematological: Does not bruise/bleed easily.  Psychiatric/Behavioral: Negative.      Physical Exam Triage Vital Signs ED Triage Vitals  Enc Vitals Group     BP 10/31/17 1345 (!) 136/57     Pulse Rate 10/31/17 1345 83     Resp 10/31/17 1345 16     Temp 10/31/17 1345 97.8 F (36.6 C)     Temp Source 10/31/17 1345 Temporal     SpO2 10/31/17 1345 96 %     Weight 10/31/17 1346 115 lb (52.2 kg)     Height --      Head Circumference --      Peak Flow --      Pain Score 10/31/17 1344 2     Pain Loc --      Pain Edu? --      Excl. in GC? --    No data found.  Updated Vital Signs BP (!) 136/57   Pulse 83   Temp 97.8 F (36.6 C) (Temporal)   Resp 16   Wt 115 lb (52.2 kg)   SpO2 96%   BMI 20.37 kg/m   Visual Acuity Right Eye Distance:   Left Eye Distance:   Bilateral Distance:    Right Eye Near:   Left Eye Near:    Bilateral Near:     Physical Exam  Constitutional: She is oriented to person, place, and time. She appears well-developed and well-nourished. No distress.    HENT:  Left frontal and temporal region with ecchymosis and tenderness to palpation. No frank swelling is noted, no lacerations  Eyes: Pupils are equal, round, and reactive to light. Right eye exhibits no discharge. Left eye exhibits no discharge. No scleral icterus.  Pulmonary/Chest: Effort normal.  Musculoskeletal:  Patient in wheelchair with pain to palpation left hip and lateral hip region. Pain with sitting external and internal ROM reproduced in the groin, no tenderness in the femur  Neurological: She is alert and oriented to person, place, and time. No cranial nerve deficit.  Skin: Skin is warm and dry. She is not diaphoretic.  Psychiatric: Her behavior is normal.  Nursing note and vitals reviewed.    UC Treatments / Results  Labs (all labs ordered are listed, but only abnormal results are displayed) Labs Reviewed - No data to display  EKG  EKG Interpretation None       Radiology No results found.  Procedures Procedures (including critical care time)  Medications Ordered in UC Medications - No data to display   Initial Impression / Assessment and Plan / UC Course  I have reviewed the triage vital signs and the nursing notes.  Pertinent labs & imaging results that were available during my care of the patient were reviewed by me and considered in my medical decision making (see chart for details).   84 yo with closed head injury, no neuro deficit and left femoral neck fracture. Will send to the ED for management.     Final Clinical Impressions(s) / UC Diagnoses   Final diagnoses:  None    ED Discharge Orders    None       Controlled Substance Prescriptions [place] Controlled Substance Registry consulted? Not Applicable   Sharin Mons 10/31/17 1510

## 2017-10-31 NOTE — ED Notes (Signed)
[facility]

## 2017-10-31 NOTE — H&P (Signed)
TRH H&P   Patient Demographics:    Teresa Callahan, is a 84 y.o. female  MRN: [id number]   DOB - 1918-07-13  Admit Date - 10/31/2017  Outpatient Primary MD for the patient is Martha Clan, MD  Referring MD/NP/PA:   Dr. Madilyn Hook   Outpatient Specialists:   Patient coming from: home  Chief Complaint  Patient presents with  . Fall  . Hip Pain      HPI:    Teresa Callahan  is a 84 y.o. female,  w Asthma, Hypothyroidism, Osteoporosis, Genella Rife, Carotid stenosis, apparently presents w c/o fall (mechanical) in the bathroom.  Pt denies syncope,  Cp, palp, sob, n/v, diarrhea, brbpr, dysuria, hematuria.  Pt went to urgent care and found to have L subcapital hip fracture.   In ED,  Xray left hip IMPRESSION: Impacted subcapital femoral neck fracture on the left. Old healed fracture left ischium. No dislocation. Symmetric narrowing both hip joints.   Xray L middle finger IMPRESSION: 1. Osteopenia. Suspected acute nondisplaced fracture involving the mid to distal shaft of the third distal phalanx 2. Arthritis of the DIP and PIP joints. Probable erosion at the base of the third proximal phalanx suggesting inflammatory/erosive arthritis 3. Old appearing fourth metacarpal fracture  CXR no active disease   CT brain/ Cervical spine IMPRESSION: 1. No intracranial trauma 2. Atrophy disease and white matter microvascular 3. No cervical spine fracture.  Na 135, K 4.1,  Bun 18, Creatinine 0.79  Ast 27, Alt 19  Wbc 9.6, Hgb 13.5, Plt 244    Pt will be admitted for L subcapital hip fracture as well as suspected acute nondisplaced  fracture involving the mid to distal shaf of the 3rd distal phalanx    Review of systems:    In addition to the HPI above,  No Fever-chills, No Headache, No changes with Vision or hearing, No problems swallowing food or Liquids, No Chest pain, Cough or  Shortness of Breath, No Abdominal pain, No Nausea or Vommitting, Bowel movements are regular, No Blood in stool or Urine, No dysuria, No new skin rashes or bruises,  No new weakness, tingling, numbness in any extremity, No recent weight gain or loss, No polyuria, polydypsia or polyphagia, No significant Mental Stressors.  A full 10 point Review of Systems was done, except as stated above, all other Review of Systems were negative.   With Past History of the following :    Past Medical History:  Diagnosis Date  . Anemia   . Asthma   . Atrophic vaginitis   . Back pain   . Carotid stenosis   . Diverticulosis   . GERD (gastroesophageal reflux disease)   . History of blood transfusion 2014   "not related to a surgery"  . Hypothyroidism   . Incontinence   . Osteoporosis   . Pneumonia    "probably" (05/09/2014)  . Postmenopausal   .  Thyroid disease   . UTI (lower urinary tract infection)   . Vertigo       Past Surgical History:  Procedure Laterality Date  . ABDOMINAL HYSTERECTOMY  1957  . CATARACT EXTRACTION W/ INTRAOCULAR LENS  IMPLANT, BILATERAL Bilateral   . COLONOSCOPY  09/30/2001  . EYE SURGERY Left    d/t complications from cataract surgery  . TONSILLECTOMY        Social History:     Social History   Tobacco Use  . Smoking status: Former Smoker    Packs/day: 0.20    Years: 8.00    Pack years: 1.60    Types: Cigarettes    Last attempt to quit: 09/22/1948    Years since quitting: 69.1  . Smokeless tobacco: Never Used  Substance Use Topics  . Alcohol use: Yes    Comment: 1 drink daily     Lives - at home  Mobility - typically walks by self   Family History :     Family History  Problem Relation Age of Onset  . Colon cancer Mother   . Melanoma Father   . Lymphoma Son       Home Medications:   Prior to Admission medications   Medication Sig Start Date End Date Taking? Authorizing Provider  acetaminophen (TYLENOL) 500 MG tablet Take  500-1,000 mg by mouth every 6 (six) hours as needed (pain).   Yes [provider]  albuterol (PROVENTIL HFA;VENTOLIN HFA) 108 (90 BASE) MCG/ACT inhaler Inhale 1 puff into the lungs 2 (two) times daily as needed for wheezing or shortness of breath.   Yes [provider]  estradiol (ESTRACE) 0.1 MG/GM vaginal cream Place 1 Applicatorful vaginally at bedtime as needed (dryness).   Yes [provider]  levothyroxine (SYNTHROID, LEVOTHROID) 75 MCG tablet Take 75 mcg by mouth daily.   Yes [provider]  loperamide (IMODIUM) 2 MG capsule Take 2 mg by mouth as needed for diarrhea or loose stools.   Yes [provider]  ciprofloxacin (CIPRO) 250 MG tablet Take 250 mg by mouth 2 (two) times daily.     [provider]  doxycycline (ADOXA) 100 MG tablet Take 100 mg by mouth daily.    [provider]  Polysaccharide Iron Complex (FERREX 150 PO) Take 150 mg by mouth daily.    [provider]     Allergies:     Allergies  Allergen Reactions  . Horse-Derived Products Other (See Comments)    Loss of appetite  . Macrodantin Other (See Comments)    Unknown reaction  . Premarin [Estrogens, Conjugated] Other (See Comments)    Loss of appetite  . Sulfa Antibiotics Other (See Comments)    Loss of appetite      Physical Exam:   Vitals  Blood pressure (!) 145/70, pulse 96, temperature (!) 97.5 F (36.4 C), temperature source Oral, resp. rate 18, height 4\' 9"  (1.448 m), weight 52.2 kg (115 lb), SpO2 94 %.   1. General  lying in bed in NAD,    2. Normal affect and insight, Not Suicidal or Homicidal, Awake Alert, Oriented X 3.  3. No F.N deficits, ALL C.Nerves Intact, Strength 5/5 all 4 extremities, Sensation intact all 4 extremities, Plantars down going.  4. Ears and Eyes appear Normal, Conjunctivae clear, PERRLA. Moist Oral Mucosa.  5. Supple Neck, No JVD, No cervical lymphadenopathy appriciated, No Carotid Bruits.  6.  Symmetrical Chest wall movement, Good air movement bilaterally, CTAB.  7. RRR, No  Gallops, Rubs or Murmurs, No Parasternal Heave.  8. Positive Bowel Sounds, Abdomen Soft, No tenderness, No organomegaly appriciated,No rebound -guarding or rigidity.  9.  No Cyanosis, Normal Skin Turgor, No Skin Rash or Bruise.  10. Good muscle tone,  joints appear normal , no effusions, Normal ROM.  11. No Palpable Lymph Nodes in Neck or Axillae  No bruise over the left hip  Bruise over the face   Data Review:    CBC Recent Labs  Lab 10/31/17 1541  WBC 9.6  HGB 13.5  HCT 39.4  PLT 244  MCV 94.0  MCH 32.2  MCHC 34.3  RDW 15.4   ------------------------------------------------------------------------------------------------------------------  Chemistries  Recent Labs  Lab 10/31/17 1541  NA 135  K 4.1  CL 102  CO2 20*  GLUCOSE 109*  BUN 18  CREATININE 0.79  CALCIUM 9.5  AST 27  ALT 19  ALKPHOS 65  BILITOT 2.1*   ------------------------------------------------------------------------------------------------------------------ estimated creatinine clearance is 27.9 mL/min (by C-G formula based on SCr of 0.79 mg/dL). ------------------------------------------------------------------------------------------------------------------ No results for input(s): TSH, T4TOTAL, T3FREE, THYROIDAB in the last 72 hours.  Invalid input(s): FREET3  Coagulation profile Recent Labs  Lab 10/31/17 1541  INR 0.97   ------------------------------------------------------------------------------------------------------------------- No results for input(s): DDIMER in the last 72 hours. -------------------------------------------------------------------------------------------------------------------  Cardiac Enzymes No results for input(s): CKMB, TROPONINI, MYOGLOBIN in the last 168 hours.  Invalid input(s):  CK ------------------------------------------------------------------------------------------------------------------ No results found for: BNP   ---------------------------------------------------------------------------------------------------------------  Urinalysis    Component Value Date/Time   COLORURINE YELLOW 05/10/2014 0818   APPEARANCEUR CLEAR 05/10/2014 0818   LABSPEC 1.009 05/10/2014 0818   PHURINE 7.0 05/10/2014 0818   GLUCOSEU NEGATIVE 05/10/2014 0818   HGBUR TRACE (A) 05/10/2014 0818   BILIRUBINUR NEGATIVE 05/10/2014 0818   KETONESUR NEGATIVE 05/10/2014 0818   PROTEINUR 30 (A) 05/10/2014 0818   UROBILINOGEN 0.2 05/10/2014 0818   NITRITE NEGATIVE 05/10/2014 0818   LEUKOCYTESUR TRACE (A) 05/10/2014 0818    ----------------------------------------------------------------------------------------------------------------   Imaging Results:    Dg Chest 1 View  Result Date: 10/31/2017 CLINICAL DATA:  Larey Seat yesterday.  Left hip fracture. EXAM: CHEST 1 VIEW COMPARISON:  09/21/2013 FINDINGS: Cardiac silhouette is normal in size. No mediastinal or hilar masses. No convincing adenopathy. Lungs show prominent bronchovascular markings. There are left infrahilar calcified nodes associated with left lower lobe calcified granuloma, stable from prior studies. No evidence of pneumonia or pulmonary edema. No convincing pleural effusion.  No pneumothorax. Skeletal structures are demineralized. There old healed left rib fractures. IMPRESSION: No active disease. Electronically Signed   By: Amie Portland M.D.   On: 10/31/2017 16:31   Ct Head Wo Contrast  Result Date: 10/31/2017 CLINICAL DATA:  Cervical spine trauma, head trauma EXAM: CT HEAD WITHOUT CONTRAST CT CERVICAL SPINE WITHOUT CONTRAST TECHNIQUE: Multidetector CT imaging of the head and cervical spine was performed following the standard protocol without intravenous contrast. Multiplanar CT image reconstructions of the cervical spine were  also generated. COMPARISON:  No acute intracranial hemorrhage. No focal mass lesion. No CT evidence of acute infarction. No midline shift or mass effect. No hydrocephalus. Basilar cisterns are patent. FINDINGS: CT HEAD FINDINGS Brain: No acute intracranial hemorrhage. No focal mass lesion. No CT evidence of acute infarction. No midline shift or mass effect. No hydrocephalus. Basilar cisterns are patent. There are periventricular and subcortical white matter hypodensities. Generalized cortical atrophy. Vascular: No hyperdense vessel or unexpected calcification. Skull: Normal. Negative for fracture or focal lesion. Sinuses/Orbits: Paranasal sinuses and mastoid air cells are clear. Orbits  are clear. Other: None. CT CERVICAL SPINE FINDINGS Alignment: Normal alignment of the cervical vertebral bodies. Skull base and vertebrae: Normal craniocervical junction. No loss of vertebral body height or disc height. Normal facet articulation. No evidence of fracture. Soft tissues and spinal canal: No prevertebral soft tissue swelling. No perispinal or epidural hematoma. Disc levels:  Unremarkable Upper chest: Clear Other: None IMPRESSION: 1. No intracranial trauma 2. Atrophy disease and white matter microvascular 3. No cervical spine fracture. Electronically Signed   By: Genevive BiStewart  Edmunds M.D.   On: 10/31/2017 17:47   Ct Cervical Spine Wo Contrast  Result Date: 10/31/2017 CLINICAL DATA:  Cervical spine trauma, head trauma EXAM: CT HEAD WITHOUT CONTRAST CT CERVICAL SPINE WITHOUT CONTRAST TECHNIQUE: Multidetector CT imaging of the head and cervical spine was performed following the standard protocol without intravenous contrast. Multiplanar CT image reconstructions of the cervical spine were also generated. COMPARISON:  No acute intracranial hemorrhage. No focal mass lesion. No CT evidence of acute infarction. No midline shift or mass effect. No hydrocephalus. Basilar cisterns are patent. FINDINGS: CT HEAD FINDINGS Brain: No acute  intracranial hemorrhage. No focal mass lesion. No CT evidence of acute infarction. No midline shift or mass effect. No hydrocephalus. Basilar cisterns are patent. There are periventricular and subcortical white matter hypodensities. Generalized cortical atrophy. Vascular: No hyperdense vessel or unexpected calcification. Skull: Normal. Negative for fracture or focal lesion. Sinuses/Orbits: Paranasal sinuses and mastoid air cells are clear. Orbits are clear. Other: None. CT CERVICAL SPINE FINDINGS Alignment: Normal alignment of the cervical vertebral bodies. Skull base and vertebrae: Normal craniocervical junction. No loss of vertebral body height or disc height. Normal facet articulation. No evidence of fracture. Soft tissues and spinal canal: No prevertebral soft tissue swelling. No perispinal or epidural hematoma. Disc levels:  Unremarkable Upper chest: Clear Other: None IMPRESSION: 1. No intracranial trauma 2. Atrophy disease and white matter microvascular 3. No cervical spine fracture. Electronically Signed   By: Genevive BiStewart  Edmunds M.D.   On: 10/31/2017 17:47   Dg Finger Middle Left  Result Date: 10/31/2017 CLINICAL DATA:  Fall with hip fracture and left middle finger pain bruising to the distal phalanx EXAM: LEFT MIDDLE FINGER 2+V COMPARISON:  None. FINDINGS: Osteopenia is present. Old appearing fourth metacarpal fracture. Joint space narrowing at the DIP and PIP joints. Possible small erosion at the base of the third proximal phalanx. Acute nondisplaced fracture involving the mid to distal shaft of the third distal phalanx. IMPRESSION: 1. Osteopenia. Suspected acute nondisplaced fracture involving the mid to distal shaft of the third distal phalanx 2. Arthritis of the DIP and PIP joints. Probable erosion at the base of the third proximal phalanx suggesting inflammatory/erosive arthritis 3. Old appearing fourth metacarpal fracture Electronically Signed   By: Jasmine PangKim  Fujinaga M.D.   On: 10/31/2017 18:35   Dg Hip  Unilat W Or Wo Pelvis 2-3 Views Left  Result Date: 10/31/2017 CLINICAL DATA:  Pain following fall EXAM: DG HIP (WITH OR WITHOUT PELVIS) 2-3V LEFT COMPARISON:  None. FINDINGS: Frontal pelvis as well as frontal and lateral left hip images were obtained. There is evidence of an impacted subcapital femoral neck fracture on the left. There is evidence of an old fracture of the left ischium with remodeling. No other evidence of fracture. No dislocation. There is mild symmetric narrowing of both hip joints. No erosive change. Rectum appears mildly distended with stool. IMPRESSION: Impacted subcapital femoral neck fracture on the left. Old healed fracture left ischium. No dislocation. Symmetric narrowing both hip  joints. Electronically Signed   By: Bretta Bang III M.D.   On: 10/31/2017 14:48   ekg  nsr at 85, nl axis, incomplete RBBB   Assessment & Plan:    Principal Problem:   Closed left hip fracture (HCC)    Impacted subcapital femoral neck fracture on the left. NPO after mn Ns iv Tylenol prn pain Morphine 1mg  iv q4h prn severe pain Zofran 4mg  iv q6h prn n/v Pt is medically cleared to have surgery Orthopedics Dr. Lajoyce Corners has been consulted by ED, appreciate input Per ED, to OR tomorrow  Suspected acute nondisplaced fracture involving the mid to distal shaft of the third distal phalanx L hand Orthopedics to please comment on this as well Please refer to rheumatology on discharge for ? Inflammatory arthritis  Recurrent UTI Cont doxycycline  Hypothyroidism Cont levothyroxine   DVT Prophylaxis  SCDs, start lovenox tomorrow evening  AM Labs Ordered, also please review Full Orders  Family Communication: Admission, patients condition and plan of care including tests being ordered have been discussed with the patient  who indicate understanding and agree with the plan and Code Status.  Code Status FULL CODE  Likely DC to  home  Condition GUARDED    Consults called: orthopedics by  ED  Admission status: inpatient  Time spent in minutes : 45   Pearson Grippe M.D on 10/31/2017 at 8:12 PM  Between 7am to 7pm - Pager - [phone] . After 7pm go to [email or web address] - password [facility]  [facility] - Office  [phone]

## 2017-10-31 NOTE — Discharge Instructions (Signed)
Patient with closed head injury (no neuro deficit) and new left femoral neck fracture. Will present to the ED for immediate evaluation.

## 2017-10-31 NOTE — ED Provider Notes (Signed)
[facility] [address]  SURGICAL Provider Note   CSN: [id number] Arrival date & time: 10/31/17  1514     History   Chief Complaint Chief Complaint  Patient presents with  . Fall  . Hip Pain    HPI Teresa Callahan is a 84 y.o. female.  The history is provided by the patient. No language interpreter was used.  Fall   Hip Pain     Teresa Callahan is a 84 y.o. female who presents to the Emergency Department complaining of fall, hip pain.  She presents from urgent care for evaluation of left hip fracture following a fall.  Yesterday she had a fall around noon.  She is unsure where she fell or what initiated the fall.  She did strike her head and her left hip.  She presented to urgent care today for evaluation and had plain films performed that demonstrated a left subcapital femur fracture.  She reports pain in her hip that is only present when she tries to get in and out of bed.  She has mild headache.  No nausea, vomiting, chest pain.  She lives at home with her son.  Past Medical History:  Diagnosis Date  . Anemia   . Asthma   . Atrophic vaginitis   . Back pain   . Carotid stenosis   . Diverticulosis   . GERD (gastroesophageal reflux disease)   . History of blood transfusion 2014   "not related to a surgery"  . Hypothyroidism   . Incontinence   . Osteoporosis   . Pneumonia    "probably" (05/09/2014)  . Postmenopausal   . Thyroid disease   . UTI (lower urinary tract infection)   . Vertigo     Patient Active Problem List   Diagnosis Date Noted  . Closed left hip fracture (HCC) 10/31/2017  . Hyponatremia 05/12/2014  . Expressive aphasia 05/10/2014  . UTI (urinary tract infection) 05/09/2014  . Altered mental status 05/09/2014  . Carotid stenosis 05/09/2014  . Acute renal failure (HCC) 05/09/2014  . Acute delirium 05/09/2014  . Unspecified hypothyroidism 10/05/2013  . Asthma, chronic 02/17/2013  . Postural dizziness 02/17/2013  . Pulmonary  infiltrates 11/24/2011  . Chronic UTI 11/24/2011  . ANGIODYSPLASIA-INTESTINE 06/25/2010  . ANEMIA, IRON DEFICIENCY 05/07/2010  . FECAL OCCULT BLOOD 05/07/2010    Past Surgical History:  Procedure Laterality Date  . ABDOMINAL HYSTERECTOMY  1957  . CATARACT EXTRACTION W/ INTRAOCULAR LENS  IMPLANT, BILATERAL Bilateral   . COLONOSCOPY  09/30/2001  . EYE SURGERY Left    d/t complications from cataract surgery  . TONSILLECTOMY      OB History    Gravida Para Term Preterm AB Living   4 4       5    SAB TAB Ectopic Multiple Live Births                   Home Medications    Prior to Admission medications   Medication Sig Start Date End Date Taking? Authorizing Provider  acetaminophen (TYLENOL) 500 MG tablet Take 500-1,000 mg by mouth every 6 (six) hours as needed (pain).   Yes [provider]  albuterol (PROVENTIL HFA;VENTOLIN HFA) 108 (90 BASE) MCG/ACT inhaler Inhale 1 puff into the lungs 2 (two) times daily as needed for wheezing or shortness of breath.   Yes [provider]  estradiol (ESTRACE) 0.1 MG/GM vaginal cream Place 1 Applicatorful vaginally at bedtime as needed (dryness).   Yes [provider]  levothyroxine (SYNTHROID, LEVOTHROID) 75 MCG tablet Take 75 mcg by mouth daily.   Yes [provider]  loperamide (IMODIUM) 2 MG capsule Take 2 mg by mouth as needed for diarrhea or loose stools.   Yes [provider]  ciprofloxacin (CIPRO) 250 MG tablet Take 250 mg by mouth 2 (two) times daily.     [provider]  doxycycline (ADOXA) 100 MG tablet Take 100 mg by mouth daily.    [provider]  Polysaccharide Iron Complex (FERREX 150 PO) Take 150 mg by mouth daily.    [provider]    Family History Family History  Problem Relation Age of Onset  . Colon cancer Mother   . Melanoma Father   . Lymphoma Son     Social History Social History   Tobacco Use  . Smoking status: Former Smoker    Packs/day:  0.20    Years: 8.00    Pack years: 1.60    Types: Cigarettes    Last attempt to quit: 09/22/1948    Years since quitting: 69.1  . Smokeless tobacco: Never Used  Substance Use Topics  . Alcohol use: Yes    Comment: 1 drink daily  . Drug use: No     Allergies   Horse-derived products; Macrodantin; Premarin [estrogens, conjugated]; and Sulfa antibiotics   Review of Systems Review of Systems  All other systems reviewed and are negative.    Physical Exam Updated Vital Signs BP 127/77 (BP Location: Left Arm)   Pulse 92   Temp 98.5 F (36.9 C) (Oral)   Resp 18   Ht 4\' 9"  (1.448 m)   Wt 51.7 kg (114 lb)   SpO2 95%   BMI 24.67 kg/m   Physical Exam  Constitutional: She appears well-developed and well-nourished.  HENT:  Head: Normocephalic.  Ecchymosis to the left temple and cheek  Cardiovascular: Normal rate and regular rhythm.  No murmur heard. Pulmonary/Chest: Effort normal and breath sounds normal. No respiratory distress.  Abdominal: Soft. There is no tenderness. There is no rebound and no guarding.  Musculoskeletal: She exhibits no tenderness.  2+ DP pulses bilaterally.  There is tenderness to palpation over the left hip.  Left lower extremity is externally rotated.  Neurological: She is alert.  Oriented to place.  Disoriented to time.  Moves all extremities.  Skin: Skin is warm and dry.  Psychiatric: She has a normal mood and affect. Her behavior is normal.  Nursing note and vitals reviewed.    ED Treatments / Results  Labs (all labs ordered are listed, but only abnormal results are displayed) Labs Reviewed  COMPREHENSIVE METABOLIC PANEL - Abnormal; Notable for the following components:      Result Value   CO2 20 (*)    Glucose, Bld 109 (*)    Total Bilirubin 2.1 (*)    All other components within normal limits  CBC  PROTIME-INR  APTT  URINALYSIS, ROUTINE W REFLEX MICROSCOPIC  COMPREHENSIVE METABOLIC PANEL  CBC  SAMPLE TO BLOOD BANK    EKG  EKG  Interpretation  Date/Time:  Saturday October 31 2017 16:37:34 EST Ventricular Rate:  84 PR Interval:    QRS Duration: 113 QT Interval:  396 QTC Calculation: 463 R Axis:   64 Text Interpretation:  Sinus rhythm Ventricular premature complex Incomplete right bundle branch block Borderline low voltage, extremity leads Baseline wander Confirmed by Tilden Fossa [phone]) on 10/31/2017 5:07:36 PM       Radiology Dg Chest 1  View  Result Date: 10/31/2017 CLINICAL DATA:  Larey Seat yesterday.  Left hip fracture. EXAM: CHEST 1 VIEW COMPARISON:  09/21/2013 FINDINGS: Cardiac silhouette is normal in size. No mediastinal or hilar masses. No convincing adenopathy. Lungs show prominent bronchovascular markings. There are left infrahilar calcified nodes associated with left lower lobe calcified granuloma, stable from prior studies. No evidence of pneumonia or pulmonary edema. No convincing pleural effusion.  No pneumothorax. Skeletal structures are demineralized. There old healed left rib fractures. IMPRESSION: No active disease. Electronically Signed   By: Amie Portland M.D.   On: 10/31/2017 16:31   Ct Head Wo Contrast  Result Date: 10/31/2017 CLINICAL DATA:  Cervical spine trauma, head trauma EXAM: CT HEAD WITHOUT CONTRAST CT CERVICAL SPINE WITHOUT CONTRAST TECHNIQUE: Multidetector CT imaging of the head and cervical spine was performed following the standard protocol without intravenous contrast. Multiplanar CT image reconstructions of the cervical spine were also generated. COMPARISON:  No acute intracranial hemorrhage. No focal mass lesion. No CT evidence of acute infarction. No midline shift or mass effect. No hydrocephalus. Basilar cisterns are patent. FINDINGS: CT HEAD FINDINGS Brain: No acute intracranial hemorrhage. No focal mass lesion. No CT evidence of acute infarction. No midline shift or mass effect. No hydrocephalus. Basilar cisterns are patent. There are periventricular and subcortical white matter  hypodensities. Generalized cortical atrophy. Vascular: No hyperdense vessel or unexpected calcification. Skull: Normal. Negative for fracture or focal lesion. Sinuses/Orbits: Paranasal sinuses and mastoid air cells are clear. Orbits are clear. Other: None. CT CERVICAL SPINE FINDINGS Alignment: Normal alignment of the cervical vertebral bodies. Skull base and vertebrae: Normal craniocervical junction. No loss of vertebral body height or disc height. Normal facet articulation. No evidence of fracture. Soft tissues and spinal canal: No prevertebral soft tissue swelling. No perispinal or epidural hematoma. Disc levels:  Unremarkable Upper chest: Clear Other: None IMPRESSION: 1. No intracranial trauma 2. Atrophy disease and white matter microvascular 3. No cervical spine fracture. Electronically Signed   By: Genevive Bi M.D.   On: 10/31/2017 17:47   Ct Cervical Spine Wo Contrast  Result Date: 10/31/2017 CLINICAL DATA:  Cervical spine trauma, head trauma EXAM: CT HEAD WITHOUT CONTRAST CT CERVICAL SPINE WITHOUT CONTRAST TECHNIQUE: Multidetector CT imaging of the head and cervical spine was performed following the standard protocol without intravenous contrast. Multiplanar CT image reconstructions of the cervical spine were also generated. COMPARISON:  No acute intracranial hemorrhage. No focal mass lesion. No CT evidence of acute infarction. No midline shift or mass effect. No hydrocephalus. Basilar cisterns are patent. FINDINGS: CT HEAD FINDINGS Brain: No acute intracranial hemorrhage. No focal mass lesion. No CT evidence of acute infarction. No midline shift or mass effect. No hydrocephalus. Basilar cisterns are patent. There are periventricular and subcortical white matter hypodensities. Generalized cortical atrophy. Vascular: No hyperdense vessel or unexpected calcification. Skull: Normal. Negative for fracture or focal lesion. Sinuses/Orbits: Paranasal sinuses and mastoid air cells are clear. Orbits are clear.  Other: None. CT CERVICAL SPINE FINDINGS Alignment: Normal alignment of the cervical vertebral bodies. Skull base and vertebrae: Normal craniocervical junction. No loss of vertebral body height or disc height. Normal facet articulation. No evidence of fracture. Soft tissues and spinal canal: No prevertebral soft tissue swelling. No perispinal or epidural hematoma. Disc levels:  Unremarkable Upper chest: Clear Other: None IMPRESSION: 1. No intracranial trauma 2. Atrophy disease and white matter microvascular 3. No cervical spine fracture. Electronically Signed   By: Genevive Bi M.D.   On: 10/31/2017 17:47   Dg  Finger Middle Left  Result Date: 10/31/2017 CLINICAL DATA:  Fall with hip fracture and left middle finger pain bruising to the distal phalanx EXAM: LEFT MIDDLE FINGER 2+V COMPARISON:  None. FINDINGS: Osteopenia is present. Old appearing fourth metacarpal fracture. Joint space narrowing at the DIP and PIP joints. Possible small erosion at the base of the third proximal phalanx. Acute nondisplaced fracture involving the mid to distal shaft of the third distal phalanx. IMPRESSION: 1. Osteopenia. Suspected acute nondisplaced fracture involving the mid to distal shaft of the third distal phalanx 2. Arthritis of the DIP and PIP joints. Probable erosion at the base of the third proximal phalanx suggesting inflammatory/erosive arthritis 3. Old appearing fourth metacarpal fracture Electronically Signed   By: Jasmine PangKim  Fujinaga M.D.   On: 10/31/2017 18:35   Dg Hip Unilat W Or Wo Pelvis 2-3 Views Left  Result Date: 10/31/2017 CLINICAL DATA:  Pain following fall EXAM: DG HIP (WITH OR WITHOUT PELVIS) 2-3V LEFT COMPARISON:  None. FINDINGS: Frontal pelvis as well as frontal and lateral left hip images were obtained. There is evidence of an impacted subcapital femoral neck fracture on the left. There is evidence of an old fracture of the left ischium with remodeling. No other evidence of fracture. No dislocation. There is  mild symmetric narrowing of both hip joints. No erosive change. Rectum appears mildly distended with stool. IMPRESSION: Impacted subcapital femoral neck fracture on the left. Old healed fracture left ischium. No dislocation. Symmetric narrowing both hip joints. Electronically Signed   By: Bretta BangWilliam  Woodruff III M.D.   On: 10/31/2017 14:48    Procedures Procedures (including critical care time)  Medications Ordered in ED Medications  albuterol (PROVENTIL) (2.5 MG/3ML) 0.083% nebulizer solution 3 mL (not administered)  levothyroxine (SYNTHROID, LEVOTHROID) tablet 75 mcg (not administered)  loperamide (IMODIUM) capsule 2 mg (2 mg Oral Given 10/31/17 2242)  enoxaparin (LOVENOX) injection 30 mg (not administered)  0.9 %  sodium chloride infusion ( Intravenous New Bag/Given 10/31/17 2243)  acetaminophen (TYLENOL) tablet 650 mg (650 mg Oral Given 10/31/17 2242)    Or  acetaminophen (TYLENOL) suppository 650 mg ( Rectal See Alternative 10/31/17 2242)  morphine 4 MG/ML injection 1 mg (not administered)  ondansetron (ZOFRAN) injection 4 mg (not administered)  doxycycline (VIBRA-TABS) tablet 100 mg (not administered)     Initial Impression / Assessment and Plan / ED Course  I have reviewed the triage vital signs and the nursing notes.  Pertinent labs & imaging results that were available during my care of the patient were reviewed by me and considered in my medical decision making (see chart for details).     Patient here for evaluation of injuries following a fall yesterday.  Imaging from urgent care demonstrates a left subcapital femur fracture.  She is neurovascularly intact on examination.  She declines any pain medications in the emergency department.  Discussed with Dr. Lajoyce Cornersuda with orthopedics, he will see the patient in consult.  Hospitalist consulted for admission.  Final Clinical Impressions(s) / ED Diagnoses   Final diagnoses:  Fall    ED Discharge Orders    None       Tilden Fossaees, Qunisha Bryk,  MD 11/01/17 [phone]

## 2017-10-31 NOTE — ED Triage Notes (Signed)
The pt fell yesterday  [facility] was [facility] and diagnosed with a   Fractured hip on the lt side  She also  Injured the lt side of her face from the fall  She also had a c-t of the head that was negative   She only hurts when she attempts to walk

## 2017-10-31 NOTE — ED Triage Notes (Signed)
PT fell in the bathroom at 7am Friday morning. PT remembers the whole incident and denies LOC. PT struck left side of forehead and has bruising to that area. Son lives with her and reports no mental status changes since incident. PT is not on a blood thinner per son. PT has tenderness to left inner thigh and bruising to left middle finger.

## 2017-11-01 ENCOUNTER — Inpatient Hospital Stay (HOSPITAL_COMMUNITY): Payer: Medicare Other

## 2017-11-01 ENCOUNTER — Inpatient Hospital Stay (HOSPITAL_COMMUNITY): Payer: Medicare Other | Admitting: Certified Registered Nurse Anesthetist

## 2017-11-01 ENCOUNTER — Encounter (HOSPITAL_COMMUNITY)
Admission: EM | Disposition: A | Discharge status: Skilled Nursing Facility | Payer: Self-pay | Source: Home / Self Care | Attending: Internal Medicine

## 2017-11-01 ENCOUNTER — Encounter (HOSPITAL_COMMUNITY): Payer: Self-pay | Admitting: Surgery

## 2017-11-01 DIAGNOSIS — S72002A Fracture of unspecified part of neck of left femur, initial encounter for closed fracture: Secondary | ICD-10-CM

## 2017-11-01 HISTORY — PX: HIP PINNING,CANNULATED: SHX1758

## 2017-11-01 LAB — COMPREHENSIVE METABOLIC PANEL
ALBUMIN: 3.6 g/dL (ref 3.5–5.0)
ALT: 15 U/L (ref 14–54)
ANION GAP: 12 (ref 5–15)
AST: 23 U/L (ref 15–41)
Alkaline Phosphatase: 57 U/L (ref 38–126)
BILIRUBIN TOTAL: 1.6 mg/dL — AB (ref 0.3–1.2)
BUN: 19 mg/dL (ref 6–20)
CO2: 19 mmol/L — ABNORMAL LOW (ref 22–32)
Calcium: 9.3 mg/dL (ref 8.9–10.3)
Chloride: 105 mmol/L (ref 101–111)
Creatinine, Ser: 0.91 mg/dL (ref 0.44–1.00)
GFR, EST AFRICAN AMERICAN: 59 mL/min — AB (ref >60)
GFR, EST NON AFRICAN AMERICAN: 51 mL/min — AB (ref >60)
Glucose, Bld: 82 mg/dL (ref 65–99)
POTASSIUM: 4.2 mmol/L (ref 3.5–5.1)
Sodium: 136 mmol/L (ref 135–145)
TOTAL PROTEIN: 6.2 g/dL — AB (ref 6.5–8.1)

## 2017-11-01 LAB — CBC
HCT: 37.9 % (ref 36.0–46.0)
HEMOGLOBIN: 12.4 g/dL (ref 12.0–15.0)
MCH: 31.2 pg (ref 26.0–34.0)
MCHC: 32.7 g/dL (ref 30.0–36.0)
MCV: 95.2 fL (ref 78.0–100.0)
Platelets: 211 10*3/uL (ref 150–400)
RBC: 3.98 MIL/uL (ref 3.87–5.11)
RDW: 15.8 % — ABNORMAL HIGH (ref 11.5–15.5)
WBC: 8 10*3/uL (ref 4.0–10.5)

## 2017-11-01 LAB — SURGICAL PCR SCREEN
MRSA, PCR: NEGATIVE
STAPHYLOCOCCUS AUREUS: NEGATIVE

## 2017-11-01 SURGERY — FIXATION, FEMUR, NECK, PERCUTANEOUS, USING SCREW
Anesthesia: General | Site: Hip | Laterality: Left

## 2017-11-01 MED ORDER — 0.9 % SODIUM CHLORIDE (POUR BTL) OPTIME
TOPICAL | Status: DC | PRN
Start: 2017-11-01 — End: 2017-11-01
  Administered 2017-11-01: 1000 mL

## 2017-11-01 MED ORDER — LIDOCAINE HCL (CARDIAC) 20 MG/ML IV SOLN
INTRAVENOUS | Status: DC | PRN
  Administered 2017-11-01: 70 mg via INTRAVENOUS

## 2017-11-01 MED ORDER — CEFAZOLIN SODIUM-DEXTROSE 2-4 GM/100ML-% IV SOLN
2.0000 g | INTRAVENOUS | Status: AC
  Administered 2017-11-01: 2 g via INTRAVENOUS
  Filled 2017-11-01: qty 100

## 2017-11-01 MED ORDER — SUCCINYLCHOLINE CHLORIDE 200 MG/10ML IV SOSY
PREFILLED_SYRINGE | INTRAVENOUS | Status: AC
  Filled 2017-11-01: qty 10

## 2017-11-01 MED ORDER — LACTATED RINGERS IV SOLN
INTRAVENOUS | Status: DC
  Administered 2017-11-01: 13:00:00 via INTRAVENOUS

## 2017-11-01 MED ORDER — ROCURONIUM BROMIDE 10 MG/ML (PF) SYRINGE
PREFILLED_SYRINGE | INTRAVENOUS | Status: AC
  Filled 2017-11-01: qty 5

## 2017-11-01 MED ORDER — LIDOCAINE 2% (20 MG/ML) 5 ML SYRINGE
INTRAMUSCULAR | Status: AC
  Filled 2017-11-01: qty 5

## 2017-11-01 MED ORDER — PROPOFOL 10 MG/ML IV BOLUS
INTRAVENOUS | Status: AC
  Filled 2017-11-01: qty 20

## 2017-11-01 MED ORDER — SUGAMMADEX SODIUM 200 MG/2ML IV SOLN
INTRAVENOUS | Status: AC
  Filled 2017-11-01: qty 2

## 2017-11-01 MED ORDER — DEXAMETHASONE SODIUM PHOSPHATE 10 MG/ML IJ SOLN
INTRAMUSCULAR | Status: DC | PRN
  Administered 2017-11-01: 5 mg via INTRAVENOUS

## 2017-11-01 MED ORDER — DEXAMETHASONE SODIUM PHOSPHATE 10 MG/ML IJ SOLN
INTRAMUSCULAR | Status: AC
  Filled 2017-11-01: qty 1

## 2017-11-01 MED ORDER — FENTANYL CITRATE (PF) 250 MCG/5ML IJ SOLN
INTRAMUSCULAR | Status: AC
  Filled 2017-11-01: qty 5

## 2017-11-01 MED ORDER — FENTANYL CITRATE (PF) 100 MCG/2ML IJ SOLN
INTRAMUSCULAR | Status: DC | PRN
  Administered 2017-11-01: 50 ug via INTRAVENOUS

## 2017-11-01 MED ORDER — ONDANSETRON HCL 4 MG/2ML IJ SOLN
INTRAMUSCULAR | Status: AC
  Filled 2017-11-01: qty 2

## 2017-11-01 MED ORDER — CEFAZOLIN SODIUM-DEXTROSE 2-4 GM/100ML-% IV SOLN
2.0000 g | Freq: Three times a day (TID) | INTRAVENOUS | Status: AC
  Administered 2017-11-01 – 2017-11-02 (×3): 2 g via INTRAVENOUS
  Filled 2017-11-01 (×3): qty 100

## 2017-11-01 MED ORDER — PHENYLEPHRINE HCL 10 MG/ML IJ SOLN
INTRAVENOUS | Status: DC | PRN
  Administered 2017-11-01: 40 ug/min via INTRAVENOUS

## 2017-11-01 MED ORDER — VANCOMYCIN HCL 500 MG IV SOLR
INTRAVENOUS | Status: DC | PRN
  Administered 2017-11-01: 500 mg via TOPICAL

## 2017-11-01 MED ORDER — FENTANYL CITRATE (PF) 100 MCG/2ML IJ SOLN: 25.0000 ug | INTRAMUSCULAR | Status: DC | PRN

## 2017-11-01 MED ORDER — TRAMADOL HCL 50 MG PO TABS
50.0000 mg | ORAL_TABLET | Freq: Four times a day (QID) | ORAL | Status: DC | PRN
  Administered 2017-11-01: 50 mg via ORAL
  Filled 2017-11-01: qty 1

## 2017-11-01 MED ORDER — PROPOFOL 10 MG/ML IV BOLUS
INTRAVENOUS | Status: DC | PRN
  Administered 2017-11-01: 100 mg via INTRAVENOUS

## 2017-11-01 MED ORDER — SUCCINYLCHOLINE CHLORIDE 20 MG/ML IJ SOLN
INTRAMUSCULAR | Status: DC | PRN
  Administered 2017-11-01: 100 mg via INTRAVENOUS

## 2017-11-01 MED ORDER — VANCOMYCIN HCL 500 MG IV SOLR
INTRAVENOUS | Status: AC
  Filled 2017-11-01: qty 500

## 2017-11-01 SURGICAL SUPPLY — 44 items
ADH SKN CLS APL DERMABOND .7 (GAUZE/BANDAGES/DRESSINGS) ×1
BIT DRILL CANN LRG QC 5X300 (BIT) ×2 IMPLANT
BNDG COHESIVE 6X5 TAN STRL LF (GAUZE/BANDAGES/DRESSINGS) ×3 IMPLANT
BRUSH SCRUB SURG 4.25 DISP (MISCELLANEOUS) ×3 IMPLANT
CATH FOLEY 2WAY SLVR  5CC 16FR (CATHETERS) ×2
CATH FOLEY 2WAY SLVR 5CC 16FR (CATHETERS) IMPLANT
CHLORAPREP W/TINT 26ML (MISCELLANEOUS) ×3 IMPLANT
COVER SURGICAL LIGHT HANDLE (MISCELLANEOUS) ×4 IMPLANT
DERMABOND ADVANCED (GAUZE/BANDAGES/DRESSINGS) ×2
DERMABOND ADVANCED .7 DNX12 (GAUZE/BANDAGES/DRESSINGS) ×1 IMPLANT
DRAPE C-ARMOR (DRAPES) ×3 IMPLANT
DRAPE IMP U-DRAPE 54X76 (DRAPES) ×6 IMPLANT
DRAPE ORTHO SPLIT 77X108 STRL (DRAPES) ×6
DRAPE PROXIMA HALF (DRAPES) ×6 IMPLANT
DRAPE STERI IOBAN 125X83 (DRAPES) ×3 IMPLANT
DRAPE SURG 17X23 STRL (DRAPES) ×3 IMPLANT
DRAPE SURG ORHT 6 SPLT 77X108 (DRAPES) ×2 IMPLANT
DRAPE U-SHAPE 47X51 STRL (DRAPES) ×3 IMPLANT
DRSG MEPILEX BORDER 4X4 (GAUZE/BANDAGES/DRESSINGS) ×3 IMPLANT
ELECT REM PT RETURN 9FT ADLT (ELECTROSURGICAL) ×3
ELECTRODE REM PT RTRN 9FT ADLT (ELECTROSURGICAL) ×1 IMPLANT
GLOVE BIO SURGEON STRL SZ7.5 (GLOVE) ×12 IMPLANT
GLOVE BIOGEL PI IND STRL 7.5 (GLOVE) ×1 IMPLANT
GLOVE BIOGEL PI INDICATOR 7.5 (GLOVE) ×2
GOWN STRL REUS W/ TWL LRG LVL3 (GOWN DISPOSABLE) ×2 IMPLANT
GOWN STRL REUS W/TWL LRG LVL3 (GOWN DISPOSABLE) ×6
GUIDEWIRE THREADED 2.8 (WIRE) ×6 IMPLANT
KIT BASIN OR (CUSTOM PROCEDURE TRAY) ×3 IMPLANT
KIT ROOM TURNOVER OR (KITS) ×3 IMPLANT
MANIFOLD NEPTUNE II (INSTRUMENTS) ×3 IMPLANT
NS IRRIG 1000ML POUR BTL (IV SOLUTION) ×3 IMPLANT
PACK GENERAL/GYN (CUSTOM PROCEDURE TRAY) ×3 IMPLANT
PAD ARMBOARD 7.5X6 YLW CONV (MISCELLANEOUS) ×6 IMPLANT
SCREW CANN 32MM 6.5X75MM (Screw) ×2 IMPLANT
SCREW CANN F/THREAD 6.5X75 (Screw) ×4 IMPLANT
STAPLER VISISTAT 35W (STAPLE) ×3 IMPLANT
STOCKINETTE IMPERVIOUS LG (DRAPES) ×3 IMPLANT
SUT MNCRL AB 3-0 PS2 18 (SUTURE) ×3 IMPLANT
SUT VIC AB 2-0 CT1 27 (SUTURE) ×6
SUT VIC AB 2-0 CT1 TAPERPNT 27 (SUTURE) ×1 IMPLANT
TOWEL OR 17X24 6PK STRL BLUE (TOWEL DISPOSABLE) ×3 IMPLANT
TOWEL OR 17X26 10 PK STRL BLUE (TOWEL DISPOSABLE) ×6 IMPLANT
WASHER FOR 5.0 SCREWS (Washer) ×4 IMPLANT
WATER STERILE IRR 1000ML POUR (IV SOLUTION) ×3 IMPLANT

## 2017-11-01 NOTE — Op Note (Signed)
OrthopaedicSurgeryOperativeNote (ZOX:[id number](CSN:[id number]) Date of Surgery: 11/01/2017  Admit Date: 10/31/2017   Diagnoses: Pre-Op Diagnoses: Left valgus impacted femoral neck fracture  Post-Op Diagnosis: Same  Procedures: CPT 27235-Percutaneous fixation of left femoral neck fracture  Surgeons: Primary: Roby LoftsHaddix, Vincenzo Stave P, MD   Location:[facility] OR ROOM 03   AnesthesiaGeneral   Antibiotics:Ancef 2g preop   Tourniquettime:None  EstimatedBloodLoss:20 mL   Complications:None  Specimens:None  Implants: Implant Name Type Inv. Item Serial No. Manufacturer Lot No. LRB No. Used Action  6.355mm x 75 stainless steele cannulated screw Screw   [employer]  Left 2 Implanted  SCREW CANN 32MM 6.5X75MM - [id number] Screw SCREW CANN 32MM 6.5X75MM  [employer]  Left 1 Implanted  WASHER FOR 5.0 SCREWS - [id number] Washer WASHER FOR 5.0 SCREWS  [employer]  Left 2 Implanted    IndicationsforSurgery: This is a 84 year old female who sustained a ground-level fall on Friday morning which is 2 days prior to surgery.  She went to the urgent care yesterday which showed a valgus impacted femoral neck fracture.  She was admitted for surgical fixation.  I was asked to take over her care due to OR availability.  After reviewing her images I felt that we could proceed with a percutaneous fixation of the femoral neck.  I discussed the risks and benefits of proceeding with this versus a hemiarthroplasty.   benefits for percutaneous fixation included less chance for blood loss, maintenance of native hip, quicker recovery, and no need for restrictions postoperatively. Risks discussed included bleeding requiring blood transfusion, bleeding causing a hematoma, infection, malunion, nonunion, damage to surrounding nerves and blood vessels, pain, hardware prominence or irritation, hardware failure, stiffness, post-traumatic arthritis, DVT/PE, compartment syndrome, and even death. Risks and benefits were extensively  discussed as noted above and the patient and their family agreed to proceed with surgery and consent was obtained.  Operative Findings: Valgus impacted femoral neck fracture fixed with three, 6.245mm cannulated screws  Procedure: The patient was identified in the preoperative holding area. Consent was confirmed with the patient and their family and all questions were answered. The operative extremity was marked after confirmation with the patient they were then brought back to the operating room by our anesthesia colleagues. They were placed under general anesthesia and carefully transferred to a radiolucent flat top table. A bump was placed under the operative hip and fluoroscopy was used to confirm that the fracture had not displaced. The operative extremity was then prepped and draped in usual sterile fashion. A preoperative timeout was performed to verify the patient, the procedure, and the extremity. Preoperative antibiotics were dosed.  Using fluoroscopy as a guide I marked out an incision. I carried this down through skin and the IT band. I used 2.698mm guidepins to direct up the femoral neck in an inverted triangle. I placed anterior superior guidepin, a posterior superior guidepin and a posterior inferior guidepin. I confirmed positioning with fluoroscopy and then proceeded to measure and placed 6.445mm screws across the femoral neck. I placed the posterior-superior guidepin with a 32mm partially threaded screw with a washer to compress the fracture. The other screws were fully threaded and one was placed with a washer  Final fluoroscopic images were obtained confirming length on all screws. An approach withdrawal technique was used to make sure all screws were extra-articular. The incision was irrigated and closed with 2-0 vicryl, 3-0 monocryl and dermabond. A dressing was placed. The patient was awoken from anesthesia and taken to the PACU in stable condition.  Post Op  Plan/Instructions: Plan for  weight bearing as tolerated left lower extremity. Ancef for postop prophylaxis. Lovenox for DVT prophylaxis for 30 days.  I was present and performed the entire surgery.  Truitt Merle, MD [facility]

## 2017-11-01 NOTE — Anesthesia Preprocedure Evaluation (Signed)
Anesthesia Evaluation  Patient identified by MRN, date of birth, ID band Patient awake    Reviewed: Allergy & Precautions, NPO status , Patient's Chart, lab work & pertinent test results  Airway Mallampati: II  TM Distance: >3 FB     Dental   Pulmonary asthma , pneumonia, former smoker,    breath sounds clear to auscultation       Cardiovascular + Peripheral Vascular Disease   Rhythm:Regular Rate:Normal     Neuro/Psych    GI/Hepatic Neg liver ROS, GERD  ,  Endo/Other  Hypothyroidism   Renal/GU Renal disease     Musculoskeletal   Abdominal   Peds  Hematology  (+) anemia ,   Anesthesia Other Findings   Reproductive/Obstetrics                             Anesthesia Physical Anesthesia Plan  ASA: III  Anesthesia Plan: General   Post-op Pain Management:    Induction: Intravenous  PONV Risk Score and Plan: 3 and Treatment may vary due to age or medical condition, Ondansetron, Dexamethasone and Midazolam  Airway Management Planned: Oral ETT  Additional Equipment:   Intra-op Plan:   Post-operative Plan: Possible Post-op intubation/ventilation  Informed Consent: I have reviewed the patients History and Physical, chart, labs and discussed the procedure including the risks, benefits and alternatives for the proposed anesthesia with the patient or authorized representative who has indicated his/her understanding and acceptance.   Dental advisory given  Plan Discussed with: CRNA and Anesthesiologist  Anesthesia Plan Comments:         Anesthesia Quick Evaluation

## 2017-11-01 NOTE — Consult Note (Signed)
Orthopaedic Trauma Service (OTS) Consult   Patient ID: Teresa Callahan MRN: [id number] DOB/AGE: [birth year] 84 y.o.  Reason for Consult: Left femoral neck fracture Referring Physician: Dr. Aldean BakerMarcus Duda, MD [facility]  HPI: Teresa Callahan is an 84 y.o. female who is being seen in consultation at the request of Dr. Lajoyce Cornersuda for evaluation of left femoral neck fracture.  The patient had fallen on Friday brought to urgent care in the hospital yesterday after continued leg pain and difficulty with weightbearing.  At baseline she lives with her son and is ambulatory with a walker.  She is very functional at baseline.  She also hit her head on her fall and a CT scan was performed which was negative.  I was asked to evaluate her for surgery as Dr. Lajoyce Cornersuda is unable to perform her surgery due to availibility.  Past Medical History:  Diagnosis Date  . Anemia   . Asthma   . Atrophic vaginitis   . Back pain   . Carotid stenosis   . Diverticulosis   . GERD (gastroesophageal reflux disease)   . History of blood transfusion 2014   "not related to a surgery"  . Hypothyroidism   . Incontinence   . Osteoporosis   . Pneumonia    "probably" (05/09/2014)  . Postmenopausal   . Thyroid disease   . UTI (lower urinary tract infection)   . Vertigo     Past Surgical History:  Procedure Laterality Date  . ABDOMINAL HYSTERECTOMY  1957  . CATARACT EXTRACTION W/ INTRAOCULAR LENS  IMPLANT, BILATERAL Bilateral   . COLONOSCOPY  09/30/2001  . EYE SURGERY Left    d/t complications from cataract surgery  . TONSILLECTOMY      Family History  Problem Relation Age of Onset  . Colon cancer Mother   . Melanoma Father   . Lymphoma Son     Social History:  reports that she quit smoking about 69 years ago. Her smoking use included cigarettes. She has a 1.60 pack-year smoking history. she has never used smokeless tobacco. She reports that she drinks alcohol. She reports that she does not use drugs.  Allergies:   Allergies  Allergen Reactions  . Horse-Derived Products Other (See Comments)    Loss of appetite  . Macrodantin Other (See Comments)    Unknown reaction  . Premarin [Estrogens, Conjugated] Other (See Comments)    Loss of appetite  . Sulfa Antibiotics Other (See Comments)    Loss of appetite     Medications:  No current facility-administered medications on file prior to encounter.    Current Outpatient Medications on File Prior to Encounter  Medication Sig Dispense Refill  . acetaminophen (TYLENOL) 500 MG tablet Take 500-1,000 mg by mouth every 6 (six) hours as needed (pain).    Marland Kitchen. albuterol (PROVENTIL HFA;VENTOLIN HFA) 108 (90 BASE) MCG/ACT inhaler Inhale 1 puff into the lungs 2 (two) times daily as needed for wheezing or shortness of breath.    . estradiol (ESTRACE) 0.1 MG/GM vaginal cream Place 1 Applicatorful vaginally at bedtime as needed (dryness).    Marland Kitchen. levothyroxine (SYNTHROID, LEVOTHROID) 75 MCG tablet Take 75 mcg by mouth daily.    Marland Kitchen. loperamide (IMODIUM) 2 MG capsule Take 2 mg by mouth as needed for diarrhea or loose stools.    . ciprofloxacin (CIPRO) 250 MG tablet Take 250 mg by mouth 2 (two) times daily.     Marland Kitchen. doxycycline (ADOXA) 100 MG tablet Take 100 mg by mouth daily.    .Marland Kitchen  Polysaccharide Iron Complex (FERREX 150 PO) Take 150 mg by mouth daily.      ROS: Constitutional: No fever or chills Vision: No changes in vision ENT: No difficulty swallowing CV: No chest pain Pulm: No SOB or wheezing GI: No nausea or vomiting GU: +for recent UTI Skin: No poor wound healing Neurologic: No numbness or tingling Psychiatric: No depression or anxiety Heme: No bruising Allergic: No reaction to medications or food   Exam: Blood pressure (!) 148/55, pulse 90, temperature 98.4 F (36.9 C), temperature source Oral, resp. rate 18, height 4\' 9"  (1.448 m), weight 51.7 kg (114 lb), SpO2 96 %. General: No acute distress she is drowsy. Orientation: Awake and alert and responds  appropriately Mood and Affect: Cooperative and pleasant Gait: Unable to assess due to fracture Coordination and balance: Within normal limits  Left lower extremity: Patient has no skin lesions of her left hip.  She has notable pain with gentle logroll of her hip.  No obvious deformities.  She is neurovascularly intact distally.  Reflexes are within normal limits, no lymphadenopathy  Right lower extremity: Skin without lesions. No tenderness to palpation. Full painless ROM, full strength in each muscle groups without evidence of instability.   Medical Decision Making: Imaging: AP and lateral view of the left hip shows a valgus impacted femoral neck fracture.  No displacement on the lateral view anteriorly or posteriorly.  Labs:  CBC    Component Value Date/Time   WBC 9.6 10/31/2017 1541   RBC 4.19 10/31/2017 1541   HGB 13.5 10/31/2017 1541   HCT 39.4 10/31/2017 1541   PLT 244 10/31/2017 1541   MCV 94.0 10/31/2017 1541   MCH 32.2 10/31/2017 1541   MCHC 34.3 10/31/2017 1541   RDW 15.4 10/31/2017 1541   LYMPHSABS 0.8 05/12/2014 0500   MONOABS 1.2 (H) 05/12/2014 0500   EOSABS 0.1 05/12/2014 0500   BASOSABS 0.1 05/12/2014 0500   Medical history and chart was reviewed  Assessment/Plan: 84 year old female with history of anemia and hypothyroidism that presents with a valgus impacted left femoral neck fracture.  I discussed at length with the patient's son and the patient herself about treatment options.  I feel that with the valgus impacted femoral neck fracture that there is potential that percutaneous fixation would be an option.  If there is any signs of displacement I would plan to proceed with a hemiarthroplasty.  Risks and benefits were discussed as noted above including no need for hip precautions with percutaneous fixation as well as less chance of blood loss requiring blood transfusion. Risks discussed included bleeding requiring blood transfusion, bleeding causing a hematoma,  infection, malunion, nonunion, damage to surrounding nerves and blood vessels, pain, hardware prominence or irritation, hardware failure, stiffness, post-traumatic arthritis, DVT/PE, compartment syndrome, and even death.  The son is understanding of this I will wait arrival of her daughter to discuss this with her as well.  Plan to perform the surgery later this afternoon.   Roby Lofts, MD [facility] [phone] (phone)

## 2017-11-01 NOTE — Consult Note (Signed)
ORTHOPAEDIC CONSULTATION  REQUESTING PHYSICIAN: Noralee Stainhoi, Jennifer, DO  Chief Complaint: Left hip pain.  HPI: Teresa Callahan is a 84 y.o. female who presents with left femoral neck impacted fracture.  Patient states she fell on Friday and was brought to the hospital yesterday.  Patient complains of inability to weight-bear complains of blunt trauma to the left side of her face CT scan is negative.  Past Medical History:  Diagnosis Date  . Anemia   . Asthma   . Atrophic vaginitis   . Back pain   . Carotid stenosis   . Diverticulosis   . GERD (gastroesophageal reflux disease)   . History of blood transfusion 2014   "not related to a surgery"  . Hypothyroidism   . Incontinence   . Osteoporosis   . Pneumonia    "probably" (05/09/2014)  . Postmenopausal   . Thyroid disease   . UTI (lower urinary tract infection)   . Vertigo    Past Surgical History:  Procedure Laterality Date  . ABDOMINAL HYSTERECTOMY  1957  . CATARACT EXTRACTION W/ INTRAOCULAR LENS  IMPLANT, BILATERAL Bilateral   . COLONOSCOPY  09/30/2001  . EYE SURGERY Left    d/t complications from cataract surgery  . TONSILLECTOMY     Social History   Socioeconomic History  . Marital status: Widowed    Spouse name: None  . Number of children: None  . Years of education: None  . Highest education level: None  Social Needs  . Financial resource strain: None  . Food insecurity - worry: None  . Food insecurity - inability: None  . Transportation needs - medical: None  . Transportation needs - non-medical: None  Occupational History  . Occupation: retired IT consultantguidence counselor  Tobacco Use  . Smoking status: Former Smoker    Packs/day: 0.20    Years: 8.00    Pack years: 1.60    Types: Cigarettes    Last attempt to quit: 09/22/1948    Years since quitting: 69.1  . Smokeless tobacco: Never Used  Substance and Sexual Activity  . Alcohol use: Yes    Comment: 1 drink daily  . Drug use: No  . Sexual activity: None    Other Topics Concern  . None  Social History Narrative  . None   Family History  Problem Relation Age of Onset  . Colon cancer Mother   . Melanoma Father   . Lymphoma Son    - negative except otherwise stated in the family history section Allergies  Allergen Reactions  . Horse-Derived Products Other (See Comments)    Loss of appetite  . Macrodantin Other (See Comments)    Unknown reaction  . Premarin [Estrogens, Conjugated] Other (See Comments)    Loss of appetite  . Sulfa Antibiotics Other (See Comments)    Loss of appetite    Prior to Admission medications   Medication Sig Start Date End Date Taking? Authorizing Provider  acetaminophen (TYLENOL) 500 MG tablet Take 500-1,000 mg by mouth every 6 (six) hours as needed (pain).   Yes [provider]  albuterol (PROVENTIL HFA;VENTOLIN HFA) 108 (90 BASE) MCG/ACT inhaler Inhale 1 puff into the lungs 2 (two) times daily as needed for wheezing or shortness of breath.   Yes [provider]  estradiol (ESTRACE) 0.1 MG/GM vaginal cream Place 1 Applicatorful vaginally at bedtime as needed (dryness).   Yes [provider]  levothyroxine (SYNTHROID, LEVOTHROID) 75 MCG tablet Take 75 mcg by mouth daily.   Yes  [provider]  loperamide (IMODIUM) 2 MG capsule Take 2 mg by mouth as needed for diarrhea or loose stools.   Yes [provider]  ciprofloxacin (CIPRO) 250 MG tablet Take 250 mg by mouth 2 (two) times daily.     [provider]  doxycycline (ADOXA) 100 MG tablet Take 100 mg by mouth daily.    [provider]  Polysaccharide Iron Complex (FERREX 150 PO) Take 150 mg by mouth daily.    [provider]   Dg Chest 1 View  Result Date: 10/31/2017 CLINICAL DATA:  Larey Seat yesterday.  Left hip fracture. EXAM: CHEST 1 VIEW COMPARISON:  09/21/2013 FINDINGS: Cardiac silhouette is normal in size. No mediastinal or hilar masses. No convincing adenopathy. Lungs show prominent  bronchovascular markings. There are left infrahilar calcified nodes associated with left lower lobe calcified granuloma, stable from prior studies. No evidence of pneumonia or pulmonary edema. No convincing pleural effusion.  No pneumothorax. Skeletal structures are demineralized. There old healed left rib fractures. IMPRESSION: No active disease. Electronically Signed   By: Amie Portland M.D.   On: 10/31/2017 16:31   Ct Head Wo Contrast  Result Date: 10/31/2017 CLINICAL DATA:  Cervical spine trauma, head trauma EXAM: CT HEAD WITHOUT CONTRAST CT CERVICAL SPINE WITHOUT CONTRAST TECHNIQUE: Multidetector CT imaging of the head and cervical spine was performed following the standard protocol without intravenous contrast. Multiplanar CT image reconstructions of the cervical spine were also generated. COMPARISON:  No acute intracranial hemorrhage. No focal mass lesion. No CT evidence of acute infarction. No midline shift or mass effect. No hydrocephalus. Basilar cisterns are patent. FINDINGS: CT HEAD FINDINGS Brain: No acute intracranial hemorrhage. No focal mass lesion. No CT evidence of acute infarction. No midline shift or mass effect. No hydrocephalus. Basilar cisterns are patent. There are periventricular and subcortical white matter hypodensities. Generalized cortical atrophy. Vascular: No hyperdense vessel or unexpected calcification. Skull: Normal. Negative for fracture or focal lesion. Sinuses/Orbits: Paranasal sinuses and mastoid air cells are clear. Orbits are clear. Other: None. CT CERVICAL SPINE FINDINGS Alignment: Normal alignment of the cervical vertebral bodies. Skull base and vertebrae: Normal craniocervical junction. No loss of vertebral body height or disc height. Normal facet articulation. No evidence of fracture. Soft tissues and spinal canal: No prevertebral soft tissue swelling. No perispinal or epidural hematoma. Disc levels:  Unremarkable Upper chest: Clear Other: None IMPRESSION: 1. No  intracranial trauma 2. Atrophy disease and white matter microvascular 3. No cervical spine fracture. Electronically Signed   By: Genevive Bi M.D.   On: 10/31/2017 17:47   Ct Cervical Spine Wo Contrast  Result Date: 10/31/2017 CLINICAL DATA:  Cervical spine trauma, head trauma EXAM: CT HEAD WITHOUT CONTRAST CT CERVICAL SPINE WITHOUT CONTRAST TECHNIQUE: Multidetector CT imaging of the head and cervical spine was performed following the standard protocol without intravenous contrast. Multiplanar CT image reconstructions of the cervical spine were also generated. COMPARISON:  No acute intracranial hemorrhage. No focal mass lesion. No CT evidence of acute infarction. No midline shift or mass effect. No hydrocephalus. Basilar cisterns are patent. FINDINGS: CT HEAD FINDINGS Brain: No acute intracranial hemorrhage. No focal mass lesion. No CT evidence of acute infarction. No midline shift or mass effect. No hydrocephalus. Basilar cisterns are patent. There are periventricular and subcortical white matter hypodensities. Generalized cortical atrophy. Vascular: No hyperdense vessel or unexpected calcification. Skull: Normal. Negative for fracture or focal lesion. Sinuses/Orbits: Paranasal sinuses and mastoid air cells are clear. Orbits are clear. Other: None. CT CERVICAL  SPINE FINDINGS Alignment: Normal alignment of the cervical vertebral bodies. Skull base and vertebrae: Normal craniocervical junction. No loss of vertebral body height or disc height. Normal facet articulation. No evidence of fracture. Soft tissues and spinal canal: No prevertebral soft tissue swelling. No perispinal or epidural hematoma. Disc levels:  Unremarkable Upper chest: Clear Other: None IMPRESSION: 1. No intracranial trauma 2. Atrophy disease and white matter microvascular 3. No cervical spine fracture. Electronically Signed   By: Genevive Bi M.D.   On: 10/31/2017 17:47   Dg Finger Middle Left  Result Date: 10/31/2017 CLINICAL DATA:   Fall with hip fracture and left middle finger pain bruising to the distal phalanx EXAM: LEFT MIDDLE FINGER 2+V COMPARISON:  None. FINDINGS: Osteopenia is present. Old appearing fourth metacarpal fracture. Joint space narrowing at the DIP and PIP joints. Possible small erosion at the base of the third proximal phalanx. Acute nondisplaced fracture involving the mid to distal shaft of the third distal phalanx. IMPRESSION: 1. Osteopenia. Suspected acute nondisplaced fracture involving the mid to distal shaft of the third distal phalanx 2. Arthritis of the DIP and PIP joints. Probable erosion at the base of the third proximal phalanx suggesting inflammatory/erosive arthritis 3. Old appearing fourth metacarpal fracture Electronically Signed   By: Jasmine Pang M.D.   On: 10/31/2017 18:35   Dg Hip Unilat W Or Wo Pelvis 2-3 Views Left  Result Date: 10/31/2017 CLINICAL DATA:  Pain following fall EXAM: DG HIP (WITH OR WITHOUT PELVIS) 2-3V LEFT COMPARISON:  None. FINDINGS: Frontal pelvis as well as frontal and lateral left hip images were obtained. There is evidence of an impacted subcapital femoral neck fracture on the left. There is evidence of an old fracture of the left ischium with remodeling. No other evidence of fracture. No dislocation. There is mild symmetric narrowing of both hip joints. No erosive change. Rectum appears mildly distended with stool. IMPRESSION: Impacted subcapital femoral neck fracture on the left. Old healed fracture left ischium. No dislocation. Symmetric narrowing both hip joints. Electronically Signed   By: Bretta Bang III M.D.   On: 10/31/2017 14:48   - pertinent xrays, CT, MRI studies were reviewed and independently interpreted  Positive ROS: All other systems have been reviewed and were otherwise negative with the exception of those mentioned in the HPI and as above.  Physical Exam: General: Alert, no acute distress Psychiatric: Patient is competent for consent with normal  mood and affect Lymphatic: No axillary or cervical lymphadenopathy Cardiovascular: No pedal edema Respiratory: No cyanosis, no use of accessory musculature GI: No organomegaly, abdomen is soft and non-tender  Skin: Examination his skin is intact over the left hip.  Patient has bruising on the left side of her face.  Neurologic: Patient does  have protective sensation bilateral lower extremities.   MUSCULOSKELETAL:  Examination patient is alert oriented no adenopathy well-dressed normal affect normal respiratory effort she has pain with attempted range of motion of the left hip.  Radiographs show an impacted left femoral neck fracture.    Assessment: Assessment: Impacted left femoral neck fracture.  Plan: Plan: plan for left hip hemiarthroplasty.  I have discussed with the patient and her son and he would like to hold on surgery until the patient's daughter arrives who is the power of attorney.  Will consult Dr. Jena Gauss for providing the surgery.  Risks and benefits were discussed with the patient and her son including the potential for not being ambulatory after surgery.  Patient and son state they understand  and they want to proceed with surgery but would like to delay surgery until the daughter is available for consultation and decision making.  Thank you for the consult and the opportunity to see Ms. Bonne Dolores, MD [facility] [phone] 9:16 AM

## 2017-11-01 NOTE — Anesthesia Postprocedure Evaluation (Signed)
Anesthesia Post Note  Patient: Teresa Callahan  Procedure(s) Performed: CANNULATED HIP PINNING (Left Hip)     Patient location during evaluation: PACU Anesthesia Type: General Level of consciousness: awake Pain management: pain level controlled Vital Signs Assessment: post-procedure vital signs reviewed and stable Respiratory status: spontaneous breathing Cardiovascular status: stable Anesthetic complications: no    Last Vitals:  Vitals:   11/01/17 1230 11/01/17 1508  BP: (!) 128/46 (!) 135/53  Pulse: 90 79  Resp: 20 14  Temp: 36.7 C (!) 36.1 C  SpO2: 98% (!) 82%    Last Pain:  Vitals:   11/01/17 1230  TempSrc: Oral  PainSc:                  Damarrion Mimbs

## 2017-11-01 NOTE — Progress Notes (Signed)
PROGRESS NOTE    Teresa Callahan  ZOX:[id number] DOB: [birth year] DOA: 10/31/2017 PCP: Martha Clan, MD     Brief Narrative:  Teresa Callahan is a 84 yo female with past medical history of asthma, hypothyroidism, osteoporosis, GERD, carotid stenosis, who presents after a mechanical fall at home. She states that she was in the bathroom when she lost her balance and fell on her left side. Evaluation revealed left hip fracture and left third digit fracture. Orthopedic surgery was consulted and patient admitted for surgical evaluation.   Assessment & Plan:   Principal Problem:   Closed left hip fracture (HCC)   Left hip fracture after mechanical fall -Orthopedic surgery consulted, planning for OR this afternoon -Chales Abrahams periop risk is low at 0.29% risk probability for perioperative MI or cardiac arrest   Left third distal phalanx fracture -Xray: acute nondisplaced fracture involving the mid to distal shaft of the third distal phalanx -Per orthopedic surgery   Recurrent UTI -Continue doxycycline  Hypothyroidism -Continue synthroid   DVT prophylaxis: Lovenox Code Status: Full Family Communication: Son and daughter at bedside Disposition Plan: Pending surgical intervention, PT OT    Consultants:   Orthopedic surgery  Procedures:   None   Antimicrobials:  Anti-infectives (From admission, onward)   Start     Dose/Rate Route Frequency Ordered Stop   11/01/17 1015  ceFAZolin (ANCEF) IVPB 2g/100 mL premix     2 g 200 mL/hr over 30 Minutes Intravenous On call to O.R. 11/01/17 1008 11/02/17 0559   11/01/17 1000  doxycycline (VIBRA-TABS) tablet 100 mg     100 mg Oral Daily 10/31/17 2130     10/31/17 2130  doxycycline (ADOXA) tablet 100 mg  Status:  Discontinued     100 mg Oral Daily 10/31/17 2124 10/31/17 2129        Subjective: Doing well this morning. She has no complaints of pain in her hip, no chest pain or shortness of breath.   Objective: Vitals:   10/31/17 2000  10/31/17 2030 10/31/17 2137 11/01/17 0445  BP: (!) 145/70 (!) 154/67 127/77 (!) 148/55  Pulse: 96 94 92 90  Resp: 18  18 18   Temp:   98.5 F (36.9 C) 98.4 F (36.9 C)  TempSrc:   Oral Oral  SpO2: 94% 95% 95% 96%  Weight:   51.7 kg (114 lb)   Height:   4\' 9"  (1.448 m)    No intake or output data in the 24 hours ending 11/01/17 1137 Filed Weights   10/31/17 1528 10/31/17 1636 10/31/17 2137  Weight: 52.2 kg (115 lb) 52.2 kg (115 lb) 51.7 kg (114 lb)    Examination:  General exam: Appears calm and comfortable, elderly and thin  Respiratory system: Clear to auscultation. Respiratory effort normal. Cardiovascular system: S1 & S2 heard, RRR. No JVD, murmurs, rubs, gallops or clicks. No pedal edema. Gastrointestinal system: Abdomen is nondistended, soft and nontender. No organomegaly or masses felt. Normal bowel sounds heard. Central nervous system: Alert and oriented. No focal neurological deficits. Extremities: Symmetric  Skin: No rashes, lesions or ulcers Psychiatry: Judgement and insight appear normal. Mood & affect appropriate.   Data Reviewed: I have personally reviewed following labs and imaging studies  CBC: Recent Labs  Lab 10/31/17 1541 11/01/17 0954  WBC 9.6 8.0  HGB 13.5 12.4  HCT 39.4 37.9  MCV 94.0 95.2  PLT 244 211   Basic Metabolic Panel: Recent Labs  Lab 10/31/17 1541 11/01/17 0954  NA 135 136  K 4.1  4.2  CL 102 105  CO2 20* 19*  GLUCOSE 109* 82  BUN 18 19  CREATININE 0.79 0.91  CALCIUM 9.5 9.3   GFR: Estimated Creatinine Clearance: 24.4 mL/min (by C-G formula based on SCr of 0.91 mg/dL). Liver Function Tests: Recent Labs  Lab 10/31/17 1541 11/01/17 0954  AST 27 23  ALT 19 15  ALKPHOS 65 57  BILITOT 2.1* 1.6*  PROT 7.1 6.2*  ALBUMIN 4.0 3.6   No results for input(s): LIPASE, AMYLASE in the last 168 hours. No results for input(s): AMMONIA in the last 168 hours. Coagulation Profile: Recent Labs  Lab 10/31/17 1541  INR 0.97   Cardiac  Enzymes: No results for input(s): CKTOTAL, CKMB, CKMBINDEX, TROPONINI in the last 168 hours. BNP (last 3 results) No results for input(s): PROBNP in the last 8760 hours. HbA1C: No results for input(s): HGBA1C in the last 72 hours. CBG: No results for input(s): GLUCAP in the last 168 hours. Lipid Profile: No results for input(s): CHOL, HDL, LDLCALC, TRIG, CHOLHDL, LDLDIRECT in the last 72 hours. Thyroid Function Tests: No results for input(s): TSH, T4TOTAL, FREET4, T3FREE, THYROIDAB in the last 72 hours. Anemia Panel: No results for input(s): VITAMINB12, FOLATE, FERRITIN, TIBC, IRON, RETICCTPCT in the last 72 hours. Sepsis Labs: No results for input(s): PROCALCITON, LATICACIDVEN in the last 168 hours.  Recent Results (from the past 240 hour(s))  Surgical PCR screen     Status: None   Collection Time: 11/01/17  3:51 AM  Result Value Ref Range Status   MRSA, PCR NEGATIVE NEGATIVE Final   Staphylococcus aureus NEGATIVE NEGATIVE Final    Comment: (NOTE) The Xpert SA Assay (FDA approved for NASAL specimens in patients 53 years of age and older), is one component of a comprehensive surveillance program. It is not intended to diagnose infection nor to guide or monitor treatment. Performed at [facility], [address] [place], [place] [address]        Radiology Studies: Dg Chest 1 View  Result Date: 10/31/2017 CLINICAL DATA:  Larey Seat yesterday.  Left hip fracture. EXAM: CHEST 1 VIEW COMPARISON:  09/21/2013 FINDINGS: Cardiac silhouette is normal in size. No mediastinal or hilar masses. No convincing adenopathy. Lungs show prominent bronchovascular markings. There are left infrahilar calcified nodes associated with left lower lobe calcified granuloma, stable from prior studies. No evidence of pneumonia or pulmonary edema. No convincing pleural effusion.  No pneumothorax. Skeletal structures are demineralized. There old healed left rib fractures. IMPRESSION: No active disease.  Electronically Signed   By: Amie Portland M.D.   On: 10/31/2017 16:31   Ct Head Wo Contrast  Result Date: 10/31/2017 CLINICAL DATA:  Cervical spine trauma, head trauma EXAM: CT HEAD WITHOUT CONTRAST CT CERVICAL SPINE WITHOUT CONTRAST TECHNIQUE: Multidetector CT imaging of the head and cervical spine was performed following the standard protocol without intravenous contrast. Multiplanar CT image reconstructions of the cervical spine were also generated. COMPARISON:  No acute intracranial hemorrhage. No focal mass lesion. No CT evidence of acute infarction. No midline shift or mass effect. No hydrocephalus. Basilar cisterns are patent. FINDINGS: CT HEAD FINDINGS Brain: No acute intracranial hemorrhage. No focal mass lesion. No CT evidence of acute infarction. No midline shift or mass effect. No hydrocephalus. Basilar cisterns are patent. There are periventricular and subcortical white matter hypodensities. Generalized cortical atrophy. Vascular: No hyperdense vessel or unexpected calcification. Skull: Normal. Negative for fracture or focal lesion. Sinuses/Orbits: Paranasal sinuses and mastoid air cells are clear. Orbits are clear. Other:  None. CT CERVICAL SPINE FINDINGS Alignment: Normal alignment of the cervical vertebral bodies. Skull base and vertebrae: Normal craniocervical junction. No loss of vertebral body height or disc height. Normal facet articulation. No evidence of fracture. Soft tissues and spinal canal: No prevertebral soft tissue swelling. No perispinal or epidural hematoma. Disc levels:  Unremarkable Upper chest: Clear Other: None IMPRESSION: 1. No intracranial trauma 2. Atrophy disease and white matter microvascular 3. No cervical spine fracture. Electronically Signed   By: Genevive BiStewart  Edmunds M.D.   On: 10/31/2017 17:47   Ct Cervical Spine Wo Contrast  Result Date: 10/31/2017 CLINICAL DATA:  Cervical spine trauma, head trauma EXAM: CT HEAD WITHOUT CONTRAST CT CERVICAL SPINE WITHOUT CONTRAST  TECHNIQUE: Multidetector CT imaging of the head and cervical spine was performed following the standard protocol without intravenous contrast. Multiplanar CT image reconstructions of the cervical spine were also generated. COMPARISON:  No acute intracranial hemorrhage. No focal mass lesion. No CT evidence of acute infarction. No midline shift or mass effect. No hydrocephalus. Basilar cisterns are patent. FINDINGS: CT HEAD FINDINGS Brain: No acute intracranial hemorrhage. No focal mass lesion. No CT evidence of acute infarction. No midline shift or mass effect. No hydrocephalus. Basilar cisterns are patent. There are periventricular and subcortical white matter hypodensities. Generalized cortical atrophy. Vascular: No hyperdense vessel or unexpected calcification. Skull: Normal. Negative for fracture or focal lesion. Sinuses/Orbits: Paranasal sinuses and mastoid air cells are clear. Orbits are clear. Other: None. CT CERVICAL SPINE FINDINGS Alignment: Normal alignment of the cervical vertebral bodies. Skull base and vertebrae: Normal craniocervical junction. No loss of vertebral body height or disc height. Normal facet articulation. No evidence of fracture. Soft tissues and spinal canal: No prevertebral soft tissue swelling. No perispinal or epidural hematoma. Disc levels:  Unremarkable Upper chest: Clear Other: None IMPRESSION: 1. No intracranial trauma 2. Atrophy disease and white matter microvascular 3. No cervical spine fracture. Electronically Signed   By: Genevive BiStewart  Edmunds M.D.   On: 10/31/2017 17:47   Dg Finger Middle Left  Result Date: 10/31/2017 CLINICAL DATA:  Fall with hip fracture and left middle finger pain bruising to the distal phalanx EXAM: LEFT MIDDLE FINGER 2+V COMPARISON:  None. FINDINGS: Osteopenia is present. Old appearing fourth metacarpal fracture. Joint space narrowing at the DIP and PIP joints. Possible small erosion at the base of the third proximal phalanx. Acute nondisplaced fracture  involving the mid to distal shaft of the third distal phalanx. IMPRESSION: 1. Osteopenia. Suspected acute nondisplaced fracture involving the mid to distal shaft of the third distal phalanx 2. Arthritis of the DIP and PIP joints. Probable erosion at the base of the third proximal phalanx suggesting inflammatory/erosive arthritis 3. Old appearing fourth metacarpal fracture Electronically Signed   By: Jasmine PangKim  Fujinaga M.D.   On: 10/31/2017 18:35   Dg Hip Unilat W Or Wo Pelvis 2-3 Views Left  Result Date: 10/31/2017 CLINICAL DATA:  Pain following fall EXAM: DG HIP (WITH OR WITHOUT PELVIS) 2-3V LEFT COMPARISON:  None. FINDINGS: Frontal pelvis as well as frontal and lateral left hip images were obtained. There is evidence of an impacted subcapital femoral neck fracture on the left. There is evidence of an old fracture of the left ischium with remodeling. No other evidence of fracture. No dislocation. There is mild symmetric narrowing of both hip joints. No erosive change. Rectum appears mildly distended with stool. IMPRESSION: Impacted subcapital femoral neck fracture on the left. Old healed fracture left ischium. No dislocation. Symmetric narrowing both hip joints. Electronically Signed  By: Bretta Bang III M.D.   On: 10/31/2017 14:48      Scheduled Meds: . doxycycline  100 mg Oral Daily  . enoxaparin (LOVENOX) injection  30 mg Subcutaneous Q24H  . levothyroxine  75 mcg Oral QAC breakfast   Continuous Infusions: . sodium chloride 75 mL/hr at 10/31/17 2243  .  ceFAZolin (ANCEF) IV       LOS: 1 day    Time spent: 40 minutes   Noralee Stain, DO [facility] [email or web address] Password [facility] 11/01/2017, 11:37 AM

## 2017-11-01 NOTE — Transfer of Care (Signed)
Immediate Anesthesia Transfer of Care Note  Patient: Caralyn GuileMargery O Popwell  Procedure(s) Performed: CANNULATED HIP PINNING (Left Hip)  Patient Location: PACU  Anesthesia Type:General  Level of Consciousness: awake, alert  and oriented  Airway & Oxygen Therapy: Patient Spontanous Breathing and Patient connected to nasal cannula oxygen  Post-op Assessment: Report given to RN and Post -op Vital signs reviewed and stable  Post vital signs: Reviewed and stable  Last Vitals:  Vitals:   11/01/17 0445 11/01/17 1230  BP: (!) 148/55 (!) 128/46  Pulse: 90 90  Resp: 18 20  Temp: 36.9 C 36.7 C  SpO2: 96% 98%    Last Pain:  Vitals:   11/01/17 1230  TempSrc: Oral  PainSc:          Complications: No apparent anesthesia complications

## 2017-11-01 NOTE — Anesthesia Procedure Notes (Signed)
Procedure Name: Intubation Date/Time: 11/01/2017 1:50 PM Performed by: Coralee RudFlores, Cheyene Hamric, CRNA Pre-anesthesia Checklist: Patient identified, Emergency Drugs available, Suction available and Patient being monitored Patient Re-evaluated:Patient Re-evaluated prior to induction Oxygen Delivery Method: Circle system utilized Preoxygenation: Pre-oxygenation with 100% oxygen Induction Type: IV induction Ventilation: Mask ventilation without difficulty Laryngoscope Size: Glidescope (Poor mouth opening, lower floor tori and full mouth dentition.  Elective Glidescope intubation.) Grade View: Grade II Tube type: Oral Tube size: 7.0 mm Airway Equipment and Method: Stylet Placement Confirmation: ETT inserted through vocal cords under direct vision,  positive ETCO2 and breath sounds checked- equal and bilateral Secured at: 21 cm Tube secured with: Tape Dental Injury: Teeth and Oropharynx as per pre-operative assessment

## 2017-11-02 LAB — CBC
HEMATOCRIT: 31.2 % — AB (ref 36.0–46.0)
Hemoglobin: 10.4 g/dL — ABNORMAL LOW (ref 12.0–15.0)
MCH: 32 pg (ref 26.0–34.0)
MCHC: 33.3 g/dL (ref 30.0–36.0)
MCV: 96 fL (ref 78.0–100.0)
PLATELETS: 209 10*3/uL (ref 150–400)
RBC: 3.25 MIL/uL — ABNORMAL LOW (ref 3.87–5.11)
RDW: 15.7 % — AB (ref 11.5–15.5)
WBC: 6.8 10*3/uL (ref 4.0–10.5)

## 2017-11-02 LAB — BASIC METABOLIC PANEL
Anion gap: 9 (ref 5–15)
BUN: 17 mg/dL (ref 6–20)
CALCIUM: 8.3 mg/dL — AB (ref 8.9–10.3)
CO2: 20 mmol/L — ABNORMAL LOW (ref 22–32)
CREATININE: 0.79 mg/dL (ref 0.44–1.00)
Chloride: 106 mmol/L (ref 101–111)
GFR calc Af Amer: >60 mL/min (ref >60)
GFR calc non Af Amer: >60 mL/min (ref >60)
GLUCOSE: 96 mg/dL (ref 65–99)
Potassium: 3.9 mmol/L (ref 3.5–5.1)
Sodium: 135 mmol/L (ref 135–145)

## 2017-11-02 LAB — HEMOGLOBIN AND HEMATOCRIT, BLOOD
HCT: 30 % — ABNORMAL LOW (ref 36.0–46.0)
Hemoglobin: 9.8 g/dL — ABNORMAL LOW (ref 12.0–15.0)

## 2017-11-02 MED ORDER — ENSURE ENLIVE PO LIQD
237.0000 mL | Freq: Two times a day (BID) | ORAL | Status: DC
  Administered 2017-11-02 – 2017-11-03 (×3): 237 mL via ORAL

## 2017-11-02 MED ORDER — SODIUM CHLORIDE 0.9 % IV BOLUS (SEPSIS)
500.0000 mL | Freq: Once | INTRAVENOUS | Status: AC
  Administered 2017-11-02: 500 mL via INTRAVENOUS

## 2017-11-02 MED ORDER — OXYCODONE-ACETAMINOPHEN 5-325 MG PO TABS
1.0000 | ORAL_TABLET | ORAL | Status: DC | PRN
Start: 2017-11-02 — End: 2017-11-04

## 2017-11-02 NOTE — Progress Notes (Addendum)
Initial Nutrition Assessment  DOCUMENTATION CODES:   Not applicable  INTERVENTION:  Provide Ensure Enlive po BID, each supplement provides 350 kcal and 20 grams of protein.  Encourage adequate PO intake.   NUTRITION DIAGNOSIS:   Increased nutrient needs related to (s/p surgery) as evidenced by estimated needs.  GOAL:   Patient will meet greater than or equal to 90% of their needs  MONITOR:   PO intake, Supplement acceptance, Labs, Weight trends, I & O's, Skin  REASON FOR ASSESSMENT:   Malnutrition Screening Tool    ASSESSMENT:   84 yo female with past medical history of asthma, hypothyroidism, osteoporosis, GERD, carotid stenosis, who presents after a mechanical fall at home.  Procedure (2/10): CANNULATED HIP PINNING (Left Hip)  Meal completion 10% this AM. Pt reports not favoring her breakfast this AM. Family at bedside will be ordering meals of food items pt will eat to encouraged po. Pt reports having a good appetite current and PTA with no other difficulties. Weight has been stable. RD to order nutritional supplements to aid in caloric and protein needs. Pt encouraged to eat her food at meals.   Labs and medications reviewed.   NUTRITION - FOCUSED PHYSICAL EXAM:  Depletion likely related to the natural aging process.    Most Recent Value  Orbital Region  Unable to assess  Upper Arm Region  No depletion  Thoracic and Lumbar Region  Moderate depletion  Buccal Region  Unable to assess  Temple Region  Unable to assess  Clavicle Bone Region  Severe depletion  Clavicle and Acromion Bone Region  Severe depletion  Scapular Bone Region  Unable to assess  Dorsal Hand  Moderate depletion  Patellar Region  Moderate depletion  Anterior Thigh Region  Moderate depletion  Posterior Calf Region  Unable to assess  Edema (RD Assessment)  None  Hair  Reviewed  Eyes  Reviewed  Mouth  Reviewed  Skin  Reviewed  Nails  Reviewed       Diet Order:  Diet regular Room service  appropriate? Yes; Fluid consistency: Thin  EDUCATION NEEDS:   Not appropriate for education at this time  Skin:  Skin Assessment: Skin Integrity Issues: Skin Integrity Issues:: Incisions Incisions: L hip  Last BM:  2/10  Height:   Ht Readings from Last 1 Encounters:  10/31/17 4\' 9"  (1.448 m)    Weight:   Wt Readings from Last 1 Encounters:  10/31/17 114 lb (51.7 kg)    Ideal Body Weight:  43 kg  BMI:  Body mass index is 24.67 kg/m.  Estimated Nutritional Needs:   Kcal:  1300-1550  Protein:  50-65 grams  Fluid:  >/= 1.5 L/day    Roslyn SmilingStephanie Tomer Chalmers, MS, RD, LDN Pager # [phone] After hours/ weekend pager # [phone]

## 2017-11-02 NOTE — NC FL2 (Signed)
[place] MEDICAID FL2 LEVEL OF CARE SCREENING TOOL     IDENTIFICATION  Patient Name: Teresa Callahan Birthdate: October 26, 1917 Sex: female Admission Date (Current Location): 10/31/2017  [facility] [place]   Facility and Address:  [facility], [address], [place], [place] [address]      Provider Number: [id number]  Attending Physician Name and Address:  Noralee Stainhoi, Jennifer, DO  Relative Name and Phone Number:  Hyman HopesClark Fayad [phone]    Current Level of Care: Hospital Recommended Level of Care: Skilled Nursing Facility Prior Approval Number:    Date Approved/Denied:   PASRR Number: [id number]  Discharge Plan: SNF    Current Diagnoses: Patient Active Problem List   Diagnosis Date Noted  . Closed left hip fracture (HCC) 10/31/2017  . Hyponatremia 05/12/2014  . Expressive aphasia 05/10/2014  . UTI (urinary tract infection) 05/09/2014  . Altered mental status 05/09/2014  . Carotid stenosis 05/09/2014  . Acute renal failure (HCC) 05/09/2014  . Acute delirium 05/09/2014  . Unspecified hypothyroidism 10/05/2013  . Asthma, chronic 02/17/2013  . Postural dizziness 02/17/2013  . Pulmonary infiltrates 11/24/2011  . Chronic UTI 11/24/2011  . ANGIODYSPLASIA-INTESTINE 06/25/2010  . ANEMIA, IRON DEFICIENCY 05/07/2010  . FECAL OCCULT BLOOD 05/07/2010    Orientation RESPIRATION BLADDER Height & Weight     Self, Time, Situation, Place  Normal Incontinent, Indwelling catheter Weight: 114 lb (51.7 kg) Height:  4\' 9"  (144.8 cm)  BEHAVIORAL SYMPTOMS/MOOD NEUROLOGICAL BOWEL NUTRITION STATUS      Continent Diet(see discharge summary)  AMBULATORY STATUS COMMUNICATION OF NEEDS Skin   Extensive Assist Verbally Surgical wounds(L Hip Incision)                       Personal Care Assistance Level of Assistance  Bathing, Feeding, Dressing Bathing Assistance: Maximum assistance Feeding assistance: Independent Dressing Assistance: Maximum  assistance     Functional Limitations Info  Sight, Hearing, Speech Sight Info: Adequate Hearing Info: Adequate Speech Info: Adequate    SPECIAL CARE FACTORS FREQUENCY  PT (By licensed PT), OT (By licensed OT)     PT Frequency: 5x week OT Frequency: 5x week            Contractures Contractures Info: Not present    Additional Factors Info  Code Status, Allergies Code Status Info: Full Code Allergies Info: HORSE-DERIVED PRODUCTS, MACRODANTIN, PREMARIN ESTROGENS, CONJUGATED, SULFA ANTIBIOTICS            Current Medications (11/02/2017):  This is the current hospital active medication list Current Facility-Administered Medications  Medication Dose Route Frequency Provider Last Rate Last Dose  . acetaminophen (TYLENOL) tablet 650 mg  650 mg Oral Q6H PRN Pearson GrippeKim, James, MD   650 mg at 11/01/17 1848   Or  . acetaminophen (TYLENOL) suppository 650 mg  650 mg Rectal Q6H PRN Pearson GrippeKim, James, MD      . albuterol (PROVENTIL) (2.5 MG/3ML) 0.083% nebulizer solution 3 mL  3 mL Inhalation BID PRN Pearson GrippeKim, James, MD      . ceFAZolin (ANCEF) IVPB 2g/100 mL premix  2 g Intravenous Q8H Haddix, Gillie MannersKevin P, MD   Stopped at 11/02/17 0741  . doxycycline (VIBRA-TABS) tablet 100 mg  100 mg Oral Daily Pearson GrippeKim, James, MD   100 mg at 11/02/17 1044  . enoxaparin (LOVENOX) injection 30 mg  30 mg Subcutaneous Q24H Pearson GrippeKim, James, MD   30 mg at 11/01/17 2036  . lactated ringers infusion   Intravenous Continuous Dorris SinghGreen, Charlene, MD      .  levothyroxine (SYNTHROID, LEVOTHROID) tablet 75 mcg  75 mcg Oral QAC breakfast Pearson Grippe, MD   75 mcg at 11/02/17 [phone]  . loperamide (IMODIUM) capsule 2 mg  2 mg Oral PRN Pearson Grippe, MD   2 mg at 11/01/17 1122  . ondansetron (ZOFRAN) injection 4 mg  4 mg Intravenous Q6H PRN Pearson Grippe, MD   4 mg at 11/01/17 1443  . oxyCODONE-acetaminophen (PERCOCET/ROXICET) 5-325 MG per tablet 1 tablet  1 tablet Oral Q4H PRN Noralee Stain, DO      . traMADol Janean Sark) tablet 50 mg  50 mg Oral Q6H PRN Haddix,  Gillie Manners, MD   50 mg at 11/01/17 9604     Discharge Medications: Please see discharge summary for a list of discharge medications.  Relevant Imaging Results:  Relevant Lab Results:   Additional Information SS# [id number] [place], [place]

## 2017-11-02 NOTE — Clinical Social Work Note (Signed)
Clinical Social Work Assessment  Patient Details  Name: Teresa Callahan MRN: [id number] Date of Birth: 1918-07-06  Date of referral:  11/02/17               Reason for consult:  Facility Placement, Discharge Planning                Permission sought to share information with:  Facility Sport and exercise psychologist, Family Supports Permission granted to share information::  Yes, Verbal Permission Granted  Name::      [place]::  SNFs  Relationship::  daughter and son  Contact Information:   [phone] & [phone]  Housing/Transportation Living arrangements for the past 2 months:  Apartment Source of Information:  Patient, Adult Children Patient Interpreter Needed:  None Criminal Activity/Legal Involvement Pertinent to Current Situation/Hospitalization:  No - Comment as needed Significant Relationships:  Adult Children, Warehouse manager, Industrial/product designer, Other Family Members Lives with:  Adult Children Do you feel safe going back to the place where you live?  Yes Need for family participation in patient care:  Yes (Comment)(supervision and mobility support)  Care giving concerns:  Pt is from home where she lives with her son, is requiring assistance with mobility and would benefit from supervision.    Social Worker assessment / plan: CSW met with pt and pt daughter at bedside, pt is oriented, lives with her son in [place]. Pt states that she has previously been to SNF with previous falls. Pt currently also has the support of an aide from [facility] 2x a week for baths etc. Pt is amenable to SNF and would like her son and daughter to be updated with any options and to support discharge planning. CSW continuing to follow.   Employment status:  Retired Brewing technologist) PT Recommendations:  [place] / Referral to community resources:  [place]  Patient/Family's Response to care:   CSW answered pt daughter questions regarding care options and SNF process. Pt daughter aware that pt and pt family should make several choices. Pt very pleasant and amenable to SNF placement.   Patient/Family's Understanding of and Emotional Response to Diagnosis, Current Treatment, and Prognosis:  Pt family states understanding of diagnosis, current treatment, and prognosis. Pt and pt family state that they would like to have options and are amenable to CSW support.   Emotional Assessment Appearance:  Appears stated age Attitude/Demeanor/Rapport:  Engaged, Gracious Affect (typically observed):  Adaptable, Accepting, Appropriate Orientation:  Oriented to Place, Oriented to  Time, Oriented to Self, Oriented to Situation Alcohol / Substance use:  Not Applicable Psych involvement (Current and /or in the community):  No (Comment)  Discharge Needs  Concerns to be addressed:  Care Coordination, Discharge Planning Concerns Readmission within the last 30 days:    Current discharge risk:  Physical Impairment, Dependent with Mobility Barriers to Discharge:  Ship broker, Continued Medical Work up   [employer], [place] 11/02/2017, 11:59 AM

## 2017-11-02 NOTE — Progress Notes (Signed)
PROGRESS NOTE    Teresa Callahan  RUE:[id number] DOB: [birth year] DOA: 10/31/2017 PCP: Martha ClanShaw, [name], MD     Brief Narrative:  Teresa Callahan is a 84 yo female with past medical history of asthma, hypothyroidism, osteoporosis, GERD, carotid stenosis, who presents after a mechanical fall at home. She states that she was in the bathroom when she lost her balance and fell on her left side. Evaluation revealed left hip fracture and left third digit fracture. Orthopedic surgery was consulted and patient admitted for surgical evaluation. She underwent percutaneous fixation on 2/10 with Dr. Jena GaussHaddix.   Assessment & Plan:   Principal Problem:   Closed left hip fracture (HCC)   Left hip fracture after mechanical fall -S/p percutaneous fixation 2/10 with Dr. Jena GaussHaddix  -PT OT eval  -Lovenox for DVT ppx for 30 days  -WBAT LLE   Left third distal phalanx fracture -Xray: acute nondisplaced fracture involving the mid to distal shaft of the third distal phalanx -Per orthopedic surgery   Recurrent UTI -Continue doxycycline  Hypothyroidism -Continue synthroid   DVT prophylaxis: Lovenox Code Status: Full Family Communication: No family at bedside Disposition Plan: Pending PT OT evaluation, possibly need SNF    Consultants:   Orthopedic surgery  Procedures:   S/p percutaneous fixation 2/10   Antimicrobials:  Anti-infectives (From admission, onward)   Start     Dose/Rate Route Frequency Ordered Stop   11/01/17 2100  ceFAZolin (ANCEF) IVPB 2g/100 mL premix     2 g 200 mL/hr over 30 Minutes Intravenous Every 8 hours 11/01/17 1656 11/02/17 2159   11/01/17 1447  vancomycin (VANCOCIN) powder  Status:  Discontinued       As needed 11/01/17 1447 11/01/17 1510   11/01/17 1015  ceFAZolin (ANCEF) IVPB 2g/100 mL premix     2 g 200 mL/hr over 30 Minutes Intravenous On call to O.R. 11/01/17 1008 11/01/17 1406   11/01/17 1000  doxycycline (VIBRA-TABS) tablet 100 mg     100 mg Oral Daily 10/31/17  2130     10/31/17 2130  doxycycline (ADOXA) tablet 100 mg  Status:  Discontinued     100 mg Oral Daily 10/31/17 2124 10/31/17 2129       Subjective: Doing well. No complaints of pain today. Denies chest pain, SOB.   Objective: Vitals:   11/01/17 1948 11/02/17 0120 11/02/17 0416 11/02/17 1023  BP: 124/60 125/64 124/60 (!) 101/44  Pulse: 84 86 80 73  Resp: 15 16 15 16   Temp: 98.4 F (36.9 C) 98.5 F (36.9 C) 98.4 F (36.9 C) (!) 97.3 F (36.3 C)  TempSrc: Oral Axillary Axillary Oral  SpO2: 99% 100% 100% 94%  Weight:      Height:        Intake/Output Summary (Last 24 hours) at 11/02/2017 1102 Last data filed at 11/02/2017 0829 Gross per 24 hour  Intake 3226.25 ml  Output 720 ml  Net 2506.25 ml   Filed Weights   10/31/17 1528 10/31/17 1636 10/31/17 2137  Weight: 52.2 kg (115 lb) 52.2 kg (115 lb) 51.7 kg (114 lb)    Examination: General exam: Appears calm and comfortable, elderly and thin  Respiratory system: Clear to auscultation. Respiratory effort normal. Cardiovascular system: S1 & S2 heard, RRR. No JVD, murmurs, rubs, gallops or clicks. No pedal edema. Gastrointestinal system: Abdomen is nondistended, soft and nontender. No organomegaly or masses felt. Normal bowel sounds heard. Central nervous system: Alert and oriented. No focal neurological deficits. Extremities: Symmetric, left middle finger in splint  Skin: No rashes, lesions or ulcers Psychiatry: Judgement and insight appear normal. Mood & affect appropriate.    Data Reviewed: I have personally reviewed following labs and imaging studies  CBC: Recent Labs  Lab 10/31/17 1541 11/01/17 0954 11/02/17 0744  WBC 9.6 8.0 6.8  HGB 13.5 12.4 10.4*  HCT 39.4 37.9 31.2*  MCV 94.0 95.2 96.0  PLT 244 211 209   Basic Metabolic Panel: Recent Labs  Lab 10/31/17 1541 11/01/17 0954 11/02/17 0744  NA 135 136 135  K 4.1 4.2 3.9  CL 102 105 106  CO2 20* 19* 20*  GLUCOSE 109* 82 96  BUN 18 19 17   CREATININE  0.79 0.91 0.79  CALCIUM 9.5 9.3 8.3*   GFR: Estimated Creatinine Clearance: 27.8 mL/min (by C-G formula based on SCr of 0.79 mg/dL). Liver Function Tests: Recent Labs  Lab 10/31/17 1541 11/01/17 0954  AST 27 23  ALT 19 15  ALKPHOS 65 57  BILITOT 2.1* 1.6*  PROT 7.1 6.2*  ALBUMIN 4.0 3.6   No results for input(s): LIPASE, AMYLASE in the last 168 hours. No results for input(s): AMMONIA in the last 168 hours. Coagulation Profile: Recent Labs  Lab 10/31/17 1541  INR 0.97   Cardiac Enzymes: No results for input(s): CKTOTAL, CKMB, CKMBINDEX, TROPONINI in the last 168 hours. BNP (last 3 results) No results for input(s): PROBNP in the last 8760 hours. HbA1C: No results for input(s): HGBA1C in the last 72 hours. CBG: No results for input(s): GLUCAP in the last 168 hours. Lipid Profile: No results for input(s): CHOL, HDL, LDLCALC, TRIG, CHOLHDL, LDLDIRECT in the last 72 hours. Thyroid Function Tests: No results for input(s): TSH, T4TOTAL, FREET4, T3FREE, THYROIDAB in the last 72 hours. Anemia Panel: No results for input(s): VITAMINB12, FOLATE, FERRITIN, TIBC, IRON, RETICCTPCT in the last 72 hours. Sepsis Labs: No results for input(s): PROCALCITON, LATICACIDVEN in the last 168 hours.  Recent Results (from the past 240 hour(s))  Surgical PCR screen     Status: None   Collection Time: 11/01/17  3:51 AM  Result Value Ref Range Status   MRSA, PCR NEGATIVE NEGATIVE Final   Staphylococcus aureus NEGATIVE NEGATIVE Final    Comment: (NOTE) The Xpert SA Assay (FDA approved for NASAL specimens in patients 16 years of age and older), is one component of a comprehensive surveillance program. It is not intended to diagnose infection nor to guide or monitor treatment. Performed at [facility], [address], [place], [place] [address]        Radiology Studies: Dg Chest 1 View  Result Date: 10/31/2017 CLINICAL DATA:  Larey Seat yesterday.  Left hip fracture. EXAM: CHEST 1 VIEW  COMPARISON:  09/21/2013 FINDINGS: Cardiac silhouette is normal in size. No mediastinal or hilar masses. No convincing adenopathy. Lungs show prominent bronchovascular markings. There are left infrahilar calcified nodes associated with left lower lobe calcified granuloma, stable from prior studies. No evidence of pneumonia or pulmonary edema. No convincing pleural effusion.  No pneumothorax. Skeletal structures are demineralized. There old healed left rib fractures. IMPRESSION: No active disease. Electronically Signed   By: Amie Portland M.D.   On: 10/31/2017 16:31   Ct Head Wo Contrast  Result Date: 10/31/2017 CLINICAL DATA:  Cervical spine trauma, head trauma EXAM: CT HEAD WITHOUT CONTRAST CT CERVICAL SPINE WITHOUT CONTRAST TECHNIQUE: Multidetector CT imaging of the head and cervical spine was performed following the standard protocol without intravenous contrast. Multiplanar CT image reconstructions of the cervical spine were also generated. COMPARISON:  No acute intracranial hemorrhage. No focal mass lesion. No CT evidence of acute infarction. No midline shift or mass effect. No hydrocephalus. Basilar cisterns are patent. FINDINGS: CT HEAD FINDINGS Brain: No acute intracranial hemorrhage. No focal mass lesion. No CT evidence of acute infarction. No midline shift or mass effect. No hydrocephalus. Basilar cisterns are patent. There are periventricular and subcortical white matter hypodensities. Generalized cortical atrophy. Vascular: No hyperdense vessel or unexpected calcification. Skull: Normal. Negative for fracture or focal lesion. Sinuses/Orbits: Paranasal sinuses and mastoid air cells are clear. Orbits are clear. Other: None. CT CERVICAL SPINE FINDINGS Alignment: Normal alignment of the cervical vertebral bodies. Skull base and vertebrae: Normal craniocervical junction. No loss of vertebral body height or disc height. Normal facet articulation. No evidence of fracture. Soft tissues and spinal canal: No  prevertebral soft tissue swelling. No perispinal or epidural hematoma. Disc levels:  Unremarkable Upper chest: Clear Other: None IMPRESSION: 1. No intracranial trauma 2. Atrophy disease and white matter microvascular 3. No cervical spine fracture. Electronically Signed   By: Genevive Bi M.D.   On: 10/31/2017 17:47   Ct Cervical Spine Wo Contrast  Result Date: 10/31/2017 CLINICAL DATA:  Cervical spine trauma, head trauma EXAM: CT HEAD WITHOUT CONTRAST CT CERVICAL SPINE WITHOUT CONTRAST TECHNIQUE: Multidetector CT imaging of the head and cervical spine was performed following the standard protocol without intravenous contrast. Multiplanar CT image reconstructions of the cervical spine were also generated. COMPARISON:  No acute intracranial hemorrhage. No focal mass lesion. No CT evidence of acute infarction. No midline shift or mass effect. No hydrocephalus. Basilar cisterns are patent. FINDINGS: CT HEAD FINDINGS Brain: No acute intracranial hemorrhage. No focal mass lesion. No CT evidence of acute infarction. No midline shift or mass effect. No hydrocephalus. Basilar cisterns are patent. There are periventricular and subcortical white matter hypodensities. Generalized cortical atrophy. Vascular: No hyperdense vessel or unexpected calcification. Skull: Normal. Negative for fracture or focal lesion. Sinuses/Orbits: Paranasal sinuses and mastoid air cells are clear. Orbits are clear. Other: None. CT CERVICAL SPINE FINDINGS Alignment: Normal alignment of the cervical vertebral bodies. Skull base and vertebrae: Normal craniocervical junction. No loss of vertebral body height or disc height. Normal facet articulation. No evidence of fracture. Soft tissues and spinal canal: No prevertebral soft tissue swelling. No perispinal or epidural hematoma. Disc levels:  Unremarkable Upper chest: Clear Other: None IMPRESSION: 1. No intracranial trauma 2. Atrophy disease and white matter microvascular 3. No cervical spine  fracture. Electronically Signed   By: Genevive Bi M.D.   On: 10/31/2017 17:47   Dg Finger Middle Left  Result Date: 10/31/2017 CLINICAL DATA:  Fall with hip fracture and left middle finger pain bruising to the distal phalanx EXAM: LEFT MIDDLE FINGER 2+V COMPARISON:  None. FINDINGS: Osteopenia is present. Old appearing fourth metacarpal fracture. Joint space narrowing at the DIP and PIP joints. Possible small erosion at the base of the third proximal phalanx. Acute nondisplaced fracture involving the mid to distal shaft of the third distal phalanx. IMPRESSION: 1. Osteopenia. Suspected acute nondisplaced fracture involving the mid to distal shaft of the third distal phalanx 2. Arthritis of the DIP and PIP joints. Probable erosion at the base of the third proximal phalanx suggesting inflammatory/erosive arthritis 3. Old appearing fourth metacarpal fracture Electronically Signed   By: Jasmine Pang M.D.   On: 10/31/2017 18:35   Dg C-arm 1-60 Min  Result Date: 11/01/2017 CLINICAL DATA:  Pin fixation for femoral neck fracture EXAM: DG C-ARM 61-120 MIN; DG HIP  2-3V LEFT COMPARISON:  Pelvis and left hip images October 31, 2017 FLUOROSCOPY TIME:  1 minute 18 seconds; 8 acquired images FINDINGS: Frontal and lateral views obtained. Subcapital femoral neck fracture on the left again noted with impaction. There are 3 screws transfixing this fracture with alignment anatomic. Tips of the screws are in the proximal femoral head. No new fracture. No dislocation. Mild narrowing left hip joint. IMPRESSION: Three screws transfix a subcapital femoral neck fracture on the left with alignment near anatomic. Tips of the screws in the proximal femoral head. No dislocation. No new fracture. Electronically Signed   By: Bretta Bang III M.D.   On: 11/01/2017 15:00   Dg Hip Unilat With Pelvis 1v Left  Result Date: 11/01/2017 CLINICAL DATA:  Postop hip pinning EXAM: DG HIP (WITH OR WITHOUT PELVIS) 1V*L* COMPARISON:   10/31/2017 FINDINGS: Three screws placed across the left femoral neck fracture. No hardware complicating feature. Anatomic alignment. IMPRESSION: Internal fixation across the left femoral neck fracture. No complicating feature. Electronically Signed   By: Charlett Nose M.D.   On: 11/01/2017 15:54   Dg Hip Unilat With Pelvis 2-3 Views Left  Result Date: 11/01/2017 CLINICAL DATA:  Pin fixation for femoral neck fracture EXAM: DG C-ARM 61-120 MIN; DG HIP  2-3V LEFT COMPARISON:  Pelvis and left hip images October 31, 2017 FLUOROSCOPY TIME:  1 minute 18 seconds; 8 acquired images FINDINGS: Frontal and lateral views obtained. Subcapital femoral neck fracture on the left again noted with impaction. There are 3 screws transfixing this fracture with alignment anatomic. Tips of the screws are in the proximal femoral head. No new fracture. No dislocation. Mild narrowing left hip joint. IMPRESSION: Three screws transfix a subcapital femoral neck fracture on the left with alignment near anatomic. Tips of the screws in the proximal femoral head. No dislocation. No new fracture. Electronically Signed   By: Bretta Bang III M.D.   On: 11/01/2017 15:00   Dg Hip Unilat W Or Wo Pelvis 2-3 Views Left  Result Date: 10/31/2017 CLINICAL DATA:  Pain following fall EXAM: DG HIP (WITH OR WITHOUT PELVIS) 2-3V LEFT COMPARISON:  None. FINDINGS: Frontal pelvis as well as frontal and lateral left hip images were obtained. There is evidence of an impacted subcapital femoral neck fracture on the left. There is evidence of an old fracture of the left ischium with remodeling. No other evidence of fracture. No dislocation. There is mild symmetric narrowing of both hip joints. No erosive change. Rectum appears mildly distended with stool. IMPRESSION: Impacted subcapital femoral neck fracture on the left. Old healed fracture left ischium. No dislocation. Symmetric narrowing both hip joints. Electronically Signed   By: Bretta Bang III  M.D.   On: 10/31/2017 14:48      Scheduled Meds: . doxycycline  100 mg Oral Daily  . enoxaparin (LOVENOX) injection  30 mg Subcutaneous Q24H  . levothyroxine  75 mcg Oral QAC breakfast   Continuous Infusions: . sodium chloride 75 mL/hr at 11/02/17 0010  .  ceFAZolin (ANCEF) IV Stopped (11/02/17 0741)  . lactated ringers       LOS: 2 days    Time spent: 20 minutes   Noralee Stain, DO [facility] [email or web address] Password [email or web address] 11/02/2017, 11:02 AM

## 2017-11-02 NOTE — Care Management Note (Signed)
Case Management Note  Patient Details  Name: Teresa Callahan MRN: [id number] Date of Birth: Mar 06, 1918  Subjective/Objective:                    Action/Plan:  S/p -Percutaneous fixation of left femoral neck fracture  Await post op PT eval Expected Discharge Date:  11/05/17               Expected Discharge Plan:     In-House Referral:     Discharge planning Services  CM Consult  Post Acute Care Choice:  Home Health, Durable Medical Equipment Choice offered to:     DME Arranged:    DME Agency:     HH Arranged:    HH Agency:     Status of Service:  In process, will continue to follow  If discussed at Long Length of Stay Meetings, dates discussed:    Additional Comments:  Teresa Callahan, Teresa Borgen Marie, RN 11/02/2017, 11:14 AM

## 2017-11-02 NOTE — Progress Notes (Signed)
Orthopaedic Trauma Progress Note  S: Doing well, pain controlled  O:  Vitals:   11/02/17 0120 11/02/17 0416  BP: 125/64 124/60  Pulse: 86 80  Resp: 16 15  Temp: 98.5 F (36.9 C) 98.4 F (36.9 C)  SpO2: 100% 100%   Gen NAD, AAOx3 LLE: dressing clean dry and intact, neuro intact distally  Labs:  CBC    Component Value Date/Time   WBC 8.0 11/01/2017 0954   RBC 3.98 11/01/2017 0954   HGB 12.4 11/01/2017 0954   HCT 37.9 11/01/2017 0954   PLT 211 11/01/2017 0954   MCV 95.2 11/01/2017 0954   MCH 31.2 11/01/2017 0954   MCHC 32.7 11/01/2017 0954   RDW 15.8 (H) 11/01/2017 0954   LYMPHSABS 0.8 05/12/2014 0500   MONOABS 1.2 (H) 05/12/2014 0500   EOSABS 0.1 05/12/2014 0500   BASOSABS 0.1 05/12/2014 0500     A/P: 84 year old female with left valgus impacted femoral neck s/p percutaneous fixation  -WBAT LLE -PT/OT -Pain Control -Lovenox for VTE prophylaxis for 30 days -Dispo: TBD  Roby LoftsKevin P. Pinchas Reither, MD [facility] [phone] (phone)

## 2017-11-02 NOTE — Evaluation (Signed)
Physical Therapy Evaluation Patient Details Name: Teresa Callahan MRN: [id number] DOB: Apr 09, 1918 Today's Date: 11/02/2017   History of Present Illness  84 yo female with onset of fall in the bathroom, sustained a L subcapital fem neck fracture with cannulated pinning.  Pt also has L middle finger mid to distal shaft phalanx fracture with splint.  PMHx:   osteopenia, old metacarpal fracture, OA,   Clinical Impression  Pt was able to do a minimal amount of moving independently, but would help with PT moving her to side of bed.  Did not have a good amount of information from pt about her home, but does report her son cannot be there all the time.  Pt is a good candidate for SNF then to assess her needs for care to return home.  May need a caregiver for the time her son cannot be there.  Follow acutely for strengthening and balance, then progress to SNF for more gait and safety training.      Follow Up Recommendations SNF    Equipment Recommendations  None recommended by PT    Recommendations for Other Services       Precautions / Restrictions Precautions Precautions: Fall(confusion) Precaution Comments:  historian Required Braces or Orthoses: Other Brace/Splint Other Brace/Splint: splint on L middle finger phalanx fracture Restrictions Weight Bearing Restrictions: No      Mobility  Bed Mobility Overal bed mobility: Needs Assistance Bed Mobility: Rolling;Supine to Sit;Sit to Supine Rolling: Min assist   Supine to sit: Mod assist Sit to supine: Max assist   General bed mobility comments: mod assist to scoot on bed pad and lift trunk but max to pivot with trunk and legs back to bed  Transfers Overall transfer level: Needs assistance Equipment used: Rolling walker (2 wheeled);1 person hand held assist Transfers: Sit to/from Stand Sit to Stand: Mod assist         General transfer comment: partially stands then sits suddenly at bed, declined to attempt a  step  Ambulation/Gait             General Gait Details: declined by pt  Stairs            Wheelchair Mobility    Modified Rankin (Stroke Patients Only)       Balance Overall balance assessment: History of Falls;Needs assistance Sitting-balance support: Feet supported;Bilateral upper extremity supported Sitting balance-Leahy Scale: Fair     Standing balance support: Bilateral upper extremity supported;During functional activity Standing balance-Leahy Scale: Poor                               Pertinent Vitals/Pain Pain Assessment: Faces Faces Pain Scale: Hurts even more Pain Location: L hip with movement to side of bed Pain Descriptors / Indicators: Operative site guarding Pain Intervention(s): Limited activity within patient's tolerance;Monitored during session;Premedicated before session;Repositioned    Home Living Family/patient expects to be discharged to:: Private residence Living Arrangements: Children Available Help at Discharge: Family;Available PRN/intermittently Type of Home: House Home Access: Level entry     Home Layout: Two level Home Equipment: Walker - 2 wheels;Walker - 4 wheels;Shower seat Additional Comments: son is home part of the time with her    Prior Function Level of Independence: Needs assistance;Independent with assistive device(s)   Gait / Transfers Assistance Needed: used rollator mainly with mod I in the house  ADL's / Homemaking Assistance Needed: son assisted with house, pt could do her own dressing  Hand Dominance   Dominant Hand: Right    Extremity/Trunk Assessment   Upper Extremity Assessment Upper Extremity Assessment: Generalized weakness    Lower Extremity Assessment Lower Extremity Assessment: Generalized weakness;LLE deficits/detail LLE Deficits / Details: weak and painful L hip post op LLE: Unable to fully assess due to pain LLE Coordination: decreased fine motor;decreased gross  motor    Cervical / Trunk Assessment Cervical / Trunk Assessment: Kyphotic  Communication   Communication: No difficulties  Cognition Arousal/Alertness: Awake/alert Behavior During Therapy: Flat affect Overall Cognitive Status: No family/caregiver present to determine baseline cognitive functioning                                 General Comments: did not understand the reason for therapy visit      General Comments General comments (skin integrity, edema, etc.): bruised and traumatized skin on limbs    Exercises     Assessment/Plan    PT Assessment Patient needs continued PT services  PT Problem List Decreased strength;Decreased range of motion;Decreased activity tolerance;Decreased balance;Decreased mobility;Decreased coordination;Decreased cognition;Decreased knowledge of use of DME;Decreased safety awareness;Decreased skin integrity;Pain       PT Treatment Interventions DME instruction;Gait training;Functional mobility training;Therapeutic activities;Therapeutic exercise;Balance training;Neuromuscular re-education;Patient/family education    PT Goals (Current goals can be found in the Care Plan section)  Acute Rehab PT Goals Patient Stated Goal: to try to stand up  PT Goal Formulation: With patient Time For Goal Achievement: 11/16/17 Potential to Achieve Goals: Good    Frequency Min 5X/week   Barriers to discharge Inaccessible home environment;Decreased caregiver support home alone at times with stairs to manage    Co-evaluation               AM-PAC PT "6 Clicks" Daily Activity  Outcome Measure Difficulty turning over in bed (including adjusting bedclothes, sheets and blankets)?: Unable Difficulty moving from lying on back to sitting on the side of the bed? : Unable Difficulty sitting down on and standing up from a chair with arms (e.g., wheelchair, bedside commode, etc,.)?: Unable Help needed moving to and from a bed to chair (including a  wheelchair)?: A Lot Help needed walking in hospital room?: A Lot Help needed climbing 3-5 steps with a railing? : Total 6 Click Score: 8    End of Session Equipment Utilized During Treatment: Gait belt Activity Tolerance: Patient limited by fatigue;Patient limited by pain Patient left: in bed;with call bell/phone within reach;with bed alarm set;with nursing/sitter in room Nurse Communication: Mobility status PT Visit Diagnosis: Unsteadiness on feet (R26.81);Muscle weakness (generalized) (M62.81);Difficulty in walking, not elsewhere classified (R26.2);Pain;History of falling (Z91.81) Pain - Right/Left: Left Pain - part of body: Hip    Time: 1012-1031 PT Time Calculation (min) (ACUTE ONLY): 19 min   Charges:   PT Evaluation $PT Eval Moderate Complexity: 1 Mod     PT G Codes:   PT G-Codes **NOT FOR INPATIENT CLASS** Functional Assessment Tool Used: AM-PAC 6 Clicks Basic Mobility    Ivar Drape 11/02/2017, 12:18 PM   Samul Dada, PT MS Acute Rehab Dept. Number: [facility] [id number] and [facility] [phone]

## 2017-11-02 NOTE — Evaluation (Signed)
Occupational Therapy Evaluation Patient Details Name: Teresa Callahan MRN: [id number] DOB: [birth year] Today's Date: 11/02/2017    History of Present Illness 84 yo female with onset of fall in the bathroom, sustained a L subcapital fem neck fracture with cannulated pinning.  Pt also has L middle finger mid to distal shaft phalanx fracture with splint.  PMHx:   osteopenia, old metacarpal fracture, OA,    Clinical Impression   PTA, pt reports living with her son and ambulating with a RW. She reports being able to complete basic ADL with modified independence and per chart, has son's assistance with home management. She was not able to provide all home information on evaluation. She currently requires min assist for LB ADL seated with lateral leans (unable to balance or stand long enough to pull clothing items up over her hips, mod assist for sit<>stand in preparation for ADL transfers, and max assist for toileting hygiene. She presents with decreased overall awareness of her situation and slight confusion (unsure of baseline). Pt would benefit from short term SNF placement to maximize safety and return to modified independence. She reports that her son is unable to provide 24 hour assistance.     Follow Up Recommendations  SNF;Supervision/Assistance - 24 hour    Equipment Recommendations  Other (comment)(defer to next venue of care)    Recommendations for Other Services       Precautions / Restrictions Precautions Precautions: Fall Precaution Comments: confusion Required Braces or Orthoses: Other Brace/Splint Other Brace/Splint: splint on L middle finger phalanx fracture Restrictions Weight Bearing Restrictions: Yes LLE Weight Bearing: Weight bearing as tolerated Other Position/Activity Restrictions: Need to clarify L hand WB precautions      Mobility Bed Mobility Overal bed mobility: Needs Assistance Bed Mobility: Supine to Sit;Sit to Supine Rolling: Min assist   Supine to sit:  Min assist Sit to supine: Min assist   General bed mobility comments: Assist to scoot to EOB and manage hips. Able to lift legs back into bed without physical assistance. Assist to reposition higher in bed.   Transfers Overall transfer level: Needs assistance Equipment used: Rolling walker (2 wheeled);1 person hand held assist Transfers: Sit to/from Stand Sit to Stand: Mod assist         General transfer comment: Standing with mod assist to power up. Significant posterior lean and unable to maintain balance directly over base of support.     Balance Overall balance assessment: History of Falls;Needs assistance Sitting-balance support: Feet supported;Bilateral upper extremity supported Sitting balance-Leahy Scale: Fair     Standing balance support: Bilateral upper extremity supported;During functional activity Standing balance-Leahy Scale: Poor                             ADL either performed or assessed with clinical judgement   ADL Overall ADL's : Needs assistance/impaired Eating/Feeding: Set up;Bed level   Grooming: Set up;Bed level   Upper Body Bathing: Min guard;Sitting   Lower Body Bathing: Minimal assistance;Sitting/lateral leans   Upper Body Dressing : Min guard;Sitting   Lower Body Dressing: Minimal assistance;Sitting/lateral leans   Toilet Transfer: Moderate assistance Toilet Transfer Details (indicate cue type and reason): Able to complete sit<>stand only at this time. No RW in room at time of evaluation (placed one in room after session). Pt limited by significant posterior lean.  Toileting- Clothing Manipulation and Hygiene: Maximal assistance;Sit to/from stand         General ADL Comments: Pt requiring  cues to participate.     Vision Baseline Vision/History: Wears glasses Wears Glasses: Reading only;At all times(reported both) Patient Visual Report: No change from baseline Additional Comments: Pt with disconjugate gaze noted but able to  track well and read her newspaper.      Perception     Praxis      Pertinent Vitals/Pain Pain Assessment: Faces Faces Pain Scale: Hurts a little bit Pain Location: L hip when moving to EOB Pain Descriptors / Indicators: Operative site guarding Pain Intervention(s): Limited activity within patient's tolerance;Monitored during session;Repositioned     Hand Dominance Right   Extremity/Trunk Assessment Upper Extremity Assessment Upper Extremity Assessment: Generalized weakness   Lower Extremity Assessment Lower Extremity Assessment: Defer to PT evaluation LLE Deficits / Details: weak and painful L hip post op LLE: Unable to fully assess due to pain LLE Coordination: decreased fine motor;decreased gross motor   Cervical / Trunk Assessment Cervical / Trunk Assessment: Kyphotic   Communication Communication Communication: No difficulties   Cognition Arousal/Alertness: Awake/alert Behavior During Therapy: WFL for tasks assessed/performed Overall Cognitive Status: No family/caregiver present to determine baseline cognitive functioning Area of Impairment: Orientation;Attention;Memory;Awareness                 Orientation Level: Disoriented to;Situation Current Attention Level: Selective Memory: Decreased short-term memory     Awareness: Emergent   General Comments: Attempting to tell therapist about her stair lift and reporting "I have an occupational therapist at home." Then when asked when the OT comes to work with her, pt reportins "I don't have a therapist." Pt with poor awareness of deficits post-operatively. Noted decreased executive functioning skills to plan ahead for upcoming transition to home.    General Comments  bruised and traumatized skin on limbs    Exercises     Shoulder Instructions      Home Living Family/patient expects to be discharged to:: Private residence Living Arrangements: Children Available Help at Discharge: Family;Available  PRN/intermittently Type of Home: House Home Access: Level entry     Home Layout: Two level Alternate Level Stairs-Number of Steps: chair lift for 13   Bathroom Shower/Tub: ("I don't usually think about it")   Firefighter: Standard     Home Equipment: Walker - 2 wheels;Walker - 4 wheels;Shower seat   Additional Comments: son is home part of the time with her      Prior Functioning/Environment Level of Independence: Needs assistance;Independent with assistive device(s)  Gait / Transfers Assistance Needed: used rollator mainly with mod I in the house ADL's / Homemaking Assistance Needed: son assisted with house, pt could do her own dressing            OT Problem List: Decreased strength;Decreased range of motion;Decreased activity tolerance;Impaired balance (sitting and/or standing);Decreased safety awareness;Decreased cognition;Decreased knowledge of use of DME or AE;Decreased knowledge of precautions;Pain      OT Treatment/Interventions: Self-care/ADL training;Therapeutic exercise;DME and/or AE instruction;Energy conservation;Therapeutic activities;Cognitive remediation/compensation;Visual/perceptual remediation/compensation;Patient/family education;Balance training    OT Goals(Current goals can be found in the care plan section) Acute Rehab OT Goals Patient Stated Goal: to go home OT Goal Formulation: With patient Time For Goal Achievement: 11/16/17 Potential to Achieve Goals: Good ADL Goals Pt Will Perform Grooming: with min assist;standing Pt Will Perform Lower Body Dressing: with supervision;sit to/from stand Pt Will Transfer to Toilet: with min assist;ambulating;bedside commode(BSC over toilet) Pt Will Perform Toileting - Clothing Manipulation and hygiene: with min assist;sit to/from stand Additional ADL Goal #1: Pt will demonstrate anticipatory awareness during toileting  tasks.  OT Frequency: Min 1X/week   Barriers to D/C:            Co-evaluation               AM-PAC PT "6 Clicks" Daily Activity     Outcome Measure Help from another person eating meals?: A Little Help from another person taking care of personal grooming?: A Little Help from another person toileting, which includes using toliet, bedpan, or urinal?: A Lot Help from another person bathing (including washing, rinsing, drying)?: A Lot Help from another person to put on and taking off regular upper body clothing?: A Little Help from another person to put on and taking off regular lower body clothing?: A Little 6 Click Score: 16   End of Session Equipment Utilized During Treatment: Gait belt Nurse Communication: Mobility status  Activity Tolerance: Patient tolerated treatment well Patient left: with call bell/phone within reach;in bed;with bed alarm set  OT Visit Diagnosis: Other abnormalities of gait and mobility (R26.89);Other symptoms and signs involving cognitive function                Time: [id number] OT Time Calculation (min): 22 min Charges:  OT General Charges $OT Visit: 1 Visit OT Evaluation $OT Eval Moderate Complexity: 1 Mod G-Codes:     Doristine Sectionharity A Eilee Schader, MS OTR/L  Pager: [phone]   Keauna Brasel A Raunel Dimartino 11/02/2017, 1:18 PM

## 2017-11-03 ENCOUNTER — Encounter (HOSPITAL_COMMUNITY): Payer: Self-pay | Admitting: General Practice

## 2017-11-03 LAB — CBC
HCT: 30.3 % — ABNORMAL LOW (ref 36.0–46.0)
HEMOGLOBIN: 10 g/dL — AB (ref 12.0–15.0)
MCH: 31.3 pg (ref 26.0–34.0)
MCHC: 33 g/dL (ref 30.0–36.0)
MCV: 94.7 fL (ref 78.0–100.0)
Platelets: 218 10*3/uL (ref 150–400)
RBC: 3.2 MIL/uL — ABNORMAL LOW (ref 3.87–5.11)
RDW: 15.3 % (ref 11.5–15.5)
WBC: 7.1 10*3/uL (ref 4.0–10.5)

## 2017-11-03 MED ORDER — SODIUM CHLORIDE 0.9% FLUSH: 3.0000 mL | Freq: Two times a day (BID) | INTRAVENOUS | Status: DC

## 2017-11-03 MED ORDER — ENOXAPARIN SODIUM 30 MG/0.3ML ~~LOC~~ SOLN: 30.0000 mg | SUBCUTANEOUS | 0 refills | Status: DC

## 2017-11-03 MED ORDER — SODIUM CHLORIDE 0.9 % IV SOLN
INTRAVENOUS | Status: DC
  Administered 2017-11-03 – 2017-11-04 (×2): via INTRAVENOUS

## 2017-11-03 MED ORDER — TRAMADOL HCL 50 MG PO TABS: 50.0000 mg | ORAL_TABLET | Freq: Four times a day (QID) | ORAL | 0 refills | Status: DC | PRN

## 2017-11-03 MED ORDER — SODIUM CHLORIDE 0.9% FLUSH: 3.0000 mL | INTRAVENOUS | Status: DC | PRN

## 2017-11-03 MED ORDER — SODIUM CHLORIDE 0.9 % IV SOLN: 250.0000 mL | INTRAVENOUS | Status: DC | PRN

## 2017-11-03 NOTE — Progress Notes (Signed)
1340 Encouraged pt to get up to urinate but no urge to void. Bladder scanned for 84ml. Bladder is soft. Foley was d/c'd at 0800 this morning. Ortho V/S checked as ordered but pt cannot stay standing for it. Done for lying and sitting up only. Message sent to Dr Alvino Chapelhoi.

## 2017-11-03 NOTE — Progress Notes (Signed)
Orthopaedic Trauma Progress Note  S: No issues overnight  O:  Vitals:   11/02/17 2041 11/03/17 0451  BP: (!) 97/53 138/62  Pulse: 83 85  Resp: 17 18  Temp: 98.9 F (37.2 C) (!) 97.4 F (36.3 C)  SpO2: 95% 94%   Gen NAD, AAOx3 LLE: dressing taken down, incision clean dry and intact, neuro intact distally  Labs:  CBC    Component Value Date/Time   WBC 7.1 11/03/2017 0520   RBC 3.20 (L) 11/03/2017 0520   HGB 10.0 (L) 11/03/2017 0520   HCT 30.3 (L) 11/03/2017 0520   PLT 218 11/03/2017 0520   MCV 94.7 11/03/2017 0520   MCH 31.3 11/03/2017 0520   MCHC 33.0 11/03/2017 0520   RDW 15.3 11/03/2017 0520   LYMPHSABS 0.8 05/12/2014 0500   MONOABS 1.2 (H) 05/12/2014 0500   EOSABS 0.1 05/12/2014 0500   BASOSABS 0.1 05/12/2014 0500     A/P: 84 year old female with left valgus impacted femoral neck s/p percutaneous fixation  -WBAT LLE -PT/OT -Pain Control -Lovenox for VTE prophylaxis for 30 days -Remove foley catheter today -Dispo: SNF  Roby LoftsKevin P. Jabre Heo, MD [facility] [phone] (phone)

## 2017-11-03 NOTE — Progress Notes (Signed)
Physical Therapy Treatment Patient Details Name: Teresa Callahan MRN: [id number] DOB: 1918/05/26 Today's Date: 11/03/2017    History of Present Illness 84 yo female with onset of fall in the bathroom, sustained a L subcapital fem neck fracture with cannulated pinning.  Pt also has L middle finger mid to distal shaft phalanx fracture with splint.  PMHx:   osteopenia, old metacarpal fracture, OA, 84 yo female admitted 10/31/17 s/p fall in the bathroom, sustained a L subcapital fem neck fracture and underwent cannulated pinning on 11/01/17.  Pt also has L middle finger mid to distal shaft phalanx fracture with splint.  PMHx:  osteoporosis, vertigo, incontinence, carotid stenosis, and back pain.    PT Comments    Pt was able to get up OOB to chair today with two person assist.  She continues to drop unexpectedly once standing with RW, so it was safer to transfer her to the chair without it.  She seemed to tolerate mobility well with not too much outward signs of pain.  PT will continue to follow acutely and progress safe mobility and exercises as able.   Follow Up Recommendations  SNF     Equipment Recommendations  None recommended by PT    Recommendations for Other Services   NA     Precautions / Restrictions Precautions Precautions: Fall Required Braces or Orthoses: Other Brace/Splint Other Brace/Splint: splint on L middle finger phalanx fracture Restrictions Weight Bearing Restrictions: Yes LLE Weight Bearing: Weight bearing as tolerated    Mobility  Bed Mobility Overal bed mobility: Needs Assistance Bed Mobility: Supine to Sit     Supine to sit: +2 for physical assistance;Mod assist;HOB elevated     General bed mobility comments: Two person mod assist to get EOB with HOB elevated. Cues for pt to grab rail to help move towards the edge.  Therapist assisting at trunk and at hips with bed pad.   Transfers Overall transfer level: Needs assistance Equipment used: Rolling walker (2  wheeled);1 person hand held assist Transfers: Sit to/from UGI CorporationStand;Stand Pivot Transfers Sit to Stand: +2 physical assistance;Min assist;Mod assist Stand pivot transfers: +2 physical assistance;Max assist       General transfer comment: Two person min assist for first stand from bed, assist with bed pad, but pt powered up well, used RW for stand, but then before we could pivot, she dropped unexpectedly back down to the bed.  Two person assist to stand pivot to the recliner with max assist for the turn, mod assist to power up for her third stand.  Ambulation/Gait             General Gait Details: unable at this time.          Balance Overall balance assessment: Needs assistance Sitting-balance support: Feet supported;Bilateral upper extremity supported Sitting balance-Leahy Scale: Fair     Standing balance support: Bilateral upper extremity supported Standing balance-Leahy Scale: Poor Standing balance comment: needs support from therapist and RW                            Cognition Arousal/Alertness: Awake/alert Behavior During Therapy: WFL for tasks assessed/performed Overall Cognitive Status: No family/caregiver present to determine baseline cognitive functioning                                        Exercises Total Joint Exercises Ankle Circles/Pumps:  AROM;Both;10 reps        Pertinent Vitals/Pain Pain Assessment: Faces Faces Pain Scale: Hurts little more Pain Location: L hip when moving to EOB Pain Descriptors / Indicators: Operative site guarding Pain Intervention(s): Limited activity within patient's tolerance;Monitored during session;Repositioned           PT Goals (current goals can now be found in the care plan section) Acute Rehab PT Goals Patient Stated Goal: to go home Progress towards PT goals: Progressing toward goals    Frequency    Min 3X/week(would benefit from more times per week at [facility])      PT Plan  Frequency needs to be updated       AM-PAC PT "6 Clicks" Daily Activity  Outcome Measure  Difficulty turning over in bed (including adjusting bedclothes, sheets and blankets)?: Unable Difficulty moving from lying on back to sitting on the side of the bed? : Unable Difficulty sitting down on and standing up from a chair with arms (e.g., wheelchair, bedside commode, etc,.)?: Unable Help needed moving to and from a bed to chair (including a wheelchair)?: A Lot Help needed walking in hospital room?: A Lot Help needed climbing 3-5 steps with a railing? : Total 6 Click Score: 8    End of Session Equipment Utilized During Treatment: Gait belt Activity Tolerance: Patient limited by fatigue;Patient limited by pain Patient left: in bed;with call bell/phone within reach;with bed alarm set;with nursing/sitter in room Nurse Communication: Mobility status PT Visit Diagnosis: Unsteadiness on feet (R26.81);Muscle weakness (generalized) (M62.81);Difficulty in walking, not elsewhere classified (R26.2);Pain;History of falling (Z91.81) Pain - Right/Left: Left Pain - part of body: Hip     Time: 8119-1478 PT Time Calculation (min) (ACUTE ONLY): 15 min  Charges:  $Therapeutic Activity: 8-22 mins          Honour Schwieger B. Winry Egnew, PT, DPT [phone]           11/03/2017, 10:05 AM

## 2017-11-03 NOTE — Clinical Social Work Note (Signed)
CSW met with patient's so Teresa Callahan) and daughter Teresa Callahan) and provided them with facility responses. First choice is [name]. Call made to [name] at [facility] regarding patient and they can take her for short-term rehab. CSW was advised to inform family that a private room is $37 per day extra. This information was relayed to daughter Teresa Callahan [phone]) and they want [name] and will pay the extra for a private room. [name] informed. Unit CSW will continue to follow and facilitate discharge to [facility] at [facility] on Wednesday, 2/13.  [name], MSW, LCSW Licensed Clinical Social Worker [place] [phone]

## 2017-11-03 NOTE — Discharge Instructions (Signed)
Orthopaedic Trauma Service Discharge Instructions   General Discharge Instructions  WEIGHT BEARING STATUS: Weight bearing as tolerated  RANGE OF MOTION/ACTIVITY: No restrictions  Wound Care: Leave open to air, may shower.  DVT/PE prophylaxis: Lovenox 30mg  for 30days  Diet: as you were eating previously.  Can use over the counter stool softeners and bowel preparations, such as Miralax, to help with bowel movements.  Narcotics can be constipating.  Be sure to drink plenty of fluids  PAIN MEDICATION USE AND EXPECTATIONS  You have likely been given narcotic medications to help control your pain.  After a traumatic event that results in an fracture (broken bone) with or without surgery, it is ok to use narcotic pain medications to help control one's pain.  We understand that everyone responds to pain differently and each individual patient will be evaluated on a regular basis for the continued need for narcotic medications. Ideally, narcotic medication use should last no more than 6-8 weeks (coinciding with fracture healing).   As a patient it is your responsibility as well to monitor narcotic medication use and report the amount and frequency you use these medications when you come to your office visit.   We would also advise that if you are using narcotic medications, you should take a dose prior to therapy to maximize you participation.  IF YOU ARE ON NARCOTIC MEDICATIONS IT IS NOT PERMISSIBLE TO OPERATE A MOTOR VEHICLE (MOTORCYCLE/CAR/TRUCK/MOPED) OR HEAVY MACHINERY DO NOT MIX NARCOTICS WITH OTHER CNS (CENTRAL NERVOUS SYSTEM) DEPRESSANTS SUCH AS ALCOHOL   DO NOT USE NONSTEROIDAL ANTI-INFLAMMATORY DRUGS (NSAID'S)  Using products such as Advil (ibuprofen), Aleve (naproxen), Motrin (ibuprofen) for additional pain control during fracture healing can delay and/or prevent the healing response.  If you would like to take over the counter (OTC) medication, Tylenol (acetaminophen) is ok.  However,  some narcotic medications that are given for pain control contain acetaminophen as well. Therefore, you should not exceed more than 4000 mg of tylenol in a day if you do not have liver disease.  Also note that there are may OTC medicines, such as cold medicines and allergy medicines that my contain tylenol as well.  If you have any questions about medications and/or interactions please ask your doctor/PA or your pharmacist.      USE AN ACE WRAP OR TED HOSE FOR SWELLING CONTROL  In addition to icing and elevation, Ace wraps or TED hose are used to help limit and resolve swelling.  It is recommended to use Ace wraps or TED hose until you are informed to stop.    When using Ace Wraps start the wrapping distally (farthest away from the body) and wrap proximally (closer to the body)   Example: If you had surgery on your leg or thing and you do not have a splint on, start the ace wrap at the toes and work your way up to the thigh        If you had surgery on your upper extremity and do not have a splint on, start the ace wrap at your fingers and work your way up to the upper arm  CALL THE OFFICE WITH ANY QUESTIONS OR CONCERNS: [phone]

## 2017-11-03 NOTE — Progress Notes (Addendum)
PROGRESS NOTE    Teresa Callahan  EAV:[id number] DOB: 1918/06/27 DOA: 10/31/2017 PCP: Martha ClanShaw, [name], MD     Brief Narrative:  Teresa Callahan is a 84 yo female with past medical history of asthma, hypothyroidism, osteoporosis, GERD, carotid stenosis, who presents after a mechanical fall at home. She states that she was in the bathroom when she lost her balance and fell on her left side. Evaluation revealed left hip fracture and left third digit fracture. Orthopedic surgery was consulted and patient admitted for surgical evaluation. She underwent percutaneous fixation on 2/10 with Dr. Jena GaussHaddix.   Assessment & Plan:   Principal Problem:   Closed left hip fracture (HCC)   Left hip fracture after mechanical fall -S/p percutaneous fixation 2/10 with Dr. Jena GaussHaddix  -PT OT eval recommending SNF  -Lovenox for DVT ppx for 30 days  -WBAT LLE   Left third distal phalanx fracture -Xray: acute nondisplaced fracture involving the mid to distal shaft of the third distal phalanx -Per orthopedic surgery   Hypotension with post-op blood loss anemia -Low BP yesterday, improved with IVF bolus. Hgb 13.5 --> 10 post-op and stable currently. BP stable this morning  -Check orthostatic VS   Recurrent UTI -On doxycycline chronically for suppression   Hypothyroidism -Continue synthroid   DVT prophylaxis: Lovenox Code Status: Full Family Communication: No family at bedside, spoke with daughter over the phone  Disposition Plan: SNF tomorrow as long as BP stable    Consultants:   Orthopedic surgery  Procedures:   S/p percutaneous fixation 2/10   Antimicrobials:  Anti-infectives (From admission, onward)   Start     Dose/Rate Route Frequency Ordered Stop   11/01/17 2100  ceFAZolin (ANCEF) IVPB 2g/100 mL premix     2 g 200 mL/hr over 30 Minutes Intravenous Every 8 hours 11/01/17 1656 11/02/17 1445   11/01/17 1447  vancomycin (VANCOCIN) powder  Status:  Discontinued       As needed 11/01/17 1447  11/01/17 1510   11/01/17 1015  ceFAZolin (ANCEF) IVPB 2g/100 mL premix     2 g 200 mL/hr over 30 Minutes Intravenous On call to O.R. 11/01/17 1008 11/01/17 1406   11/01/17 1000  doxycycline (VIBRA-TABS) tablet 100 mg     100 mg Oral Daily 10/31/17 2130     10/31/17 2130  doxycycline (ADOXA) tablet 100 mg  Status:  Discontinued     100 mg Oral Daily 10/31/17 2124 10/31/17 2129       Subjective: No new issues. Continues to work with PT   Objective: Vitals:   11/02/17 1700 11/02/17 2041 11/03/17 0451 11/03/17 0900  BP: (!) 88/45 (!) 97/53 138/62 (!) 95/54  Pulse:  83 85 73  Resp:  17 18 18   Temp:  98.9 F (37.2 C) (!) 97.4 F (36.3 C) 98 F (36.7 C)  TempSrc:  Oral Oral Oral  SpO2:  95% 94% 98%  Weight:   49.6 kg (109 lb 5.6 oz)   Height:        Intake/Output Summary (Last 24 hours) at 11/03/2017 1104 Last data filed at 11/03/2017 0800 Gross per 24 hour  Intake 930 ml  Output 575 ml  Net 355 ml   Filed Weights   10/31/17 1636 10/31/17 2137 11/03/17 0451  Weight: 52.2 kg (115 lb) 51.7 kg (114 lb) 49.6 kg (109 lb 5.6 oz)    Examination: General exam: Appears calm and comfortable  Respiratory system: Clear to auscultation. Respiratory effort normal. Cardiovascular system: S1 & S2 heard, RRR.  No JVD, murmurs, rubs, gallops or clicks. No pedal edema. Gastrointestinal system: Abdomen is nondistended, soft and nontender. No organomegaly or masses felt. Normal bowel sounds heard. Central nervous system: Alert and oriented. No focal neurological deficits. Extremities: Symmetric  Skin: No rashes, lesions or ulcers Psychiatry: Judgement and insight appear normal. Mood & affect appropriate.   Data Reviewed: I have personally reviewed following labs and imaging studies  CBC: Recent Labs  Lab 10/31/17 1541 11/01/17 0954 11/02/17 0744 11/02/17 1448 11/03/17 0520  WBC 9.6 8.0 6.8  --  7.1  HGB 13.5 12.4 10.4* 9.8* 10.0*  HCT 39.4 37.9 31.2* 30.0* 30.3*  MCV 94.0 95.2 96.0   --  94.7  PLT 244 211 209  --  218   Basic Metabolic Panel: Recent Labs  Lab 10/31/17 1541 11/01/17 0954 11/02/17 0744  NA 135 136 135  K 4.1 4.2 3.9  CL 102 105 106  CO2 20* 19* 20*  GLUCOSE 109* 82 96  BUN 18 19 17   CREATININE 0.79 0.91 0.79  CALCIUM 9.5 9.3 8.3*   GFR: Estimated Creatinine Clearance: 27.3 mL/min (by C-G formula based on SCr of 0.79 mg/dL). Liver Function Tests: Recent Labs  Lab 10/31/17 1541 11/01/17 0954  AST 27 23  ALT 19 15  ALKPHOS 65 57  BILITOT 2.1* 1.6*  PROT 7.1 6.2*  ALBUMIN 4.0 3.6   No results for input(s): LIPASE, AMYLASE in the last 168 hours. No results for input(s): AMMONIA in the last 168 hours. Coagulation Profile: Recent Labs  Lab 10/31/17 1541  INR 0.97   Cardiac Enzymes: No results for input(s): CKTOTAL, CKMB, CKMBINDEX, TROPONINI in the last 168 hours. BNP (last 3 results) No results for input(s): PROBNP in the last 8760 hours. HbA1C: No results for input(s): HGBA1C in the last 72 hours. CBG: No results for input(s): GLUCAP in the last 168 hours. Lipid Profile: No results for input(s): CHOL, HDL, LDLCALC, TRIG, CHOLHDL, LDLDIRECT in the last 72 hours. Thyroid Function Tests: No results for input(s): TSH, T4TOTAL, FREET4, T3FREE, THYROIDAB in the last 72 hours. Anemia Panel: No results for input(s): VITAMINB12, FOLATE, FERRITIN, TIBC, IRON, RETICCTPCT in the last 72 hours. Sepsis Labs: No results for input(s): PROCALCITON, LATICACIDVEN in the last 168 hours.  Recent Results (from the past 240 hour(s))  Surgical PCR screen     Status: None   Collection Time: 11/01/17  3:51 AM  Result Value Ref Range Status   MRSA, PCR NEGATIVE NEGATIVE Final   Staphylococcus aureus NEGATIVE NEGATIVE Final    Comment: (NOTE) The Xpert SA Assay (FDA approved for NASAL specimens in patients 71 years of age and older), is one component of a comprehensive surveillance program. It is not intended to diagnose infection nor to guide or  monitor treatment. Performed at [facility], [address], [place], [place] [address]        Radiology Studies: Dg C-arm 1-60 Min  Result Date: 11/01/2017 CLINICAL DATA:  Pin fixation for femoral neck fracture EXAM: DG C-ARM 61-120 MIN; DG HIP  2-3V LEFT COMPARISON:  Pelvis and left hip images October 31, 2017 FLUOROSCOPY TIME:  1 minute 18 seconds; 8 acquired images FINDINGS: Frontal and lateral views obtained. Subcapital femoral neck fracture on the left again noted with impaction. There are 3 screws transfixing this fracture with alignment anatomic. Tips of the screws are in the proximal femoral head. No new fracture. No dislocation. Mild narrowing left hip joint. IMPRESSION: Three screws transfix a subcapital femoral neck fracture  on the left with alignment near anatomic. Tips of the screws in the proximal femoral head. No dislocation. No new fracture. Electronically Signed   By: Bretta Bang III M.D.   On: 11/01/2017 15:00   Dg Hip Unilat With Pelvis 1v Left  Result Date: 11/01/2017 CLINICAL DATA:  Postop hip pinning EXAM: DG HIP (WITH OR WITHOUT PELVIS) 1V*L* COMPARISON:  10/31/2017 FINDINGS: Three screws placed across the left femoral neck fracture. No hardware complicating feature. Anatomic alignment. IMPRESSION: Internal fixation across the left femoral neck fracture. No complicating feature. Electronically Signed   By: Charlett Nose M.D.   On: 11/01/2017 15:54   Dg Hip Unilat With Pelvis 2-3 Views Left  Result Date: 11/01/2017 CLINICAL DATA:  Pin fixation for femoral neck fracture EXAM: DG C-ARM 61-120 MIN; DG HIP  2-3V LEFT COMPARISON:  Pelvis and left hip images October 31, 2017 FLUOROSCOPY TIME:  1 minute 18 seconds; 8 acquired images FINDINGS: Frontal and lateral views obtained. Subcapital femoral neck fracture on the left again noted with impaction. There are 3 screws transfixing this fracture with alignment anatomic. Tips of the screws are in the proximal femoral head.  No new fracture. No dislocation. Mild narrowing left hip joint. IMPRESSION: Three screws transfix a subcapital femoral neck fracture on the left with alignment near anatomic. Tips of the screws in the proximal femoral head. No dislocation. No new fracture. Electronically Signed   By: Bretta Bang III M.D.   On: 11/01/2017 15:00      Scheduled Meds: . doxycycline  100 mg Oral Daily  . enoxaparin (LOVENOX) injection  30 mg Subcutaneous Q24H  . feeding supplement (ENSURE ENLIVE)  237 mL Oral BID BM  . levothyroxine  75 mcg Oral QAC breakfast   Continuous Infusions: . lactated ringers       LOS: 3 days    Time spent: 30 minutes   Noralee Stain, DO [facility] [email or web address] Password [facility] 11/03/2017, 11:04 AM

## 2017-11-03 NOTE — Consult Note (Signed)
[facility] CM Primary Care Navigator  11/03/2017  Teresa Callahan 1918-09-07 [id number]   Met with patient and son Teresa Callahan) at the bedside to identify possible discharge needs. Patientand son reportsthat she "fell at home" and fractured left hip (status postcannulated left hip pinning) that resulted to this admission.  Patient endorses Dr.[name] Brigitte Callahan with [facility] astheprimary care provider.   [facility] [address] [facility] without[facility]y.  Son reports that he has beenmanagingpatient's medicationsat homestraight out of the containers.  Patient son has beenprimarily providingtransportation to patient's doctors'appointments. Her daughter Teresa Callahan) can also provide transportation if needed.  [place] with son athome. Her son serves as the primary caregiver. Son mentioned that caregiver (CNA) from [employer] needs twice a week , 1 hour per day as well.   According to son, anticipated discharge plan is skilled nursing facility (SNF) for short- term rehabilitationper therapy recommendation.   Patient/ son voiced understanding to callprimary care provider's officewhen she returnsbackhometo schedulea post discharge follow-upwithin1-2 weeksor sooner if needed. Patient letter (with PCP's contact number) was provided as areminder.  Explained topatient/ son regarding[facility] CM services available for health managementand resourcesat home. Son indicated having no barriers, issues or concerns at thistime. Patient's sonvoiced understandingofneedto seek referral from primary care provider to [facility] care management ifdeemed necessary and appropriatefor services in the [place] home.  [facility] care management information provided for future needs thatshe may have.   For additional questions  please contact:  Teresa Callahan, BSN, RN-BC [facility] PRIMARY CARE Navigator Cell: [phone]

## 2017-11-04 DIAGNOSIS — Z9181 History of falling: Secondary | ICD-10-CM | POA: Diagnosis not present

## 2017-11-04 DIAGNOSIS — S72002D Fracture of unspecified part of neck of left femur, subsequent encounter for closed fracture with routine healing: Secondary | ICD-10-CM | POA: Diagnosis not present

## 2017-11-04 DIAGNOSIS — J45909 Unspecified asthma, uncomplicated: Secondary | ICD-10-CM | POA: Diagnosis not present

## 2017-11-04 DIAGNOSIS — Z87440 Personal history of urinary (tract) infections: Secondary | ICD-10-CM | POA: Diagnosis not present

## 2017-11-04 DIAGNOSIS — S79911A Unspecified injury of right hip, initial encounter: Secondary | ICD-10-CM | POA: Diagnosis not present

## 2017-11-04 DIAGNOSIS — E039 Hypothyroidism, unspecified: Secondary | ICD-10-CM | POA: Diagnosis not present

## 2017-11-04 DIAGNOSIS — G8911 Acute pain due to trauma: Secondary | ICD-10-CM | POA: Diagnosis not present

## 2017-11-04 DIAGNOSIS — K219 Gastro-esophageal reflux disease without esophagitis: Secondary | ICD-10-CM | POA: Diagnosis not present

## 2017-11-04 DIAGNOSIS — F801 Expressive language disorder: Secondary | ICD-10-CM | POA: Diagnosis not present

## 2017-11-04 DIAGNOSIS — N390 Urinary tract infection, site not specified: Secondary | ICD-10-CM | POA: Diagnosis not present

## 2017-11-04 DIAGNOSIS — I6529 Occlusion and stenosis of unspecified carotid artery: Secondary | ICD-10-CM | POA: Diagnosis not present

## 2017-11-04 DIAGNOSIS — M810 Age-related osteoporosis without current pathological fracture: Secondary | ICD-10-CM | POA: Diagnosis not present

## 2017-11-04 DIAGNOSIS — S72002A Fracture of unspecified part of neck of left femur, initial encounter for closed fracture: Secondary | ICD-10-CM | POA: Diagnosis not present

## 2017-11-04 DIAGNOSIS — S72032D Displaced midcervical fracture of left femur, subsequent encounter for closed fracture with routine healing: Secondary | ICD-10-CM | POA: Diagnosis not present

## 2017-11-04 DIAGNOSIS — D509 Iron deficiency anemia, unspecified: Secondary | ICD-10-CM | POA: Diagnosis not present

## 2017-11-04 LAB — CBC
HCT: 29.3 % — ABNORMAL LOW (ref 36.0–46.0)
Hemoglobin: 9.7 g/dL — ABNORMAL LOW (ref 12.0–15.0)
MCH: 31.1 pg (ref 26.0–34.0)
MCHC: 33.1 g/dL (ref 30.0–36.0)
MCV: 93.9 fL (ref 78.0–100.0)
Platelets: 217 10*3/uL (ref 150–400)
RBC: 3.12 MIL/uL — ABNORMAL LOW (ref 3.87–5.11)
RDW: 15.5 % (ref 11.5–15.5)
WBC: 5.4 10*3/uL (ref 4.0–10.5)

## 2017-11-04 NOTE — Progress Notes (Signed)
[facility] family at bedside, AVS given to EMS, report called to facility and given to nurse there. All belongings taken with family.

## 2017-11-04 NOTE — Discharge Summary (Addendum)
Physician Discharge Summary  Teresa GuileMargery O Callahan [id number] DOB: 1918/06/21 DOA: 10/31/2017  PCP: Martha ClanShaw, [name], MD  Admit date: 10/31/2017 Discharge date: 11/04/2017  Admitted From:  Home Disposition:  SNF   Recommendations for Outpatient Follow-up:  1. Follow up with PCP in 1 week 2. Follow up with Orthopedic surgery in 2 weeks  3. Please obtain CBC in 1 week to ensure stability of Hgb. Post-op Hgb has remained stable around 10  4. Lovenox for VTE prophylaxis for 30 days  Discharge Condition: Stable CODE STATUS: Full  Diet recommendation: Regular   Brief/Interim Summary: Teresa Callahan is a 84 yo female with past medical history of asthma, hypothyroidism, osteoporosis, GERD, carotid stenosis, who presents after a mechanical fall at home. She states that she was in the bathroom when she lost her balance and fell on her left side. Evaluation revealed left hip fracture and left third digit fracture. Orthopedic surgery was consulted and patient admitted for surgical evaluation. She underwent percutaneous fixation on 2/10 with Dr. Jena GaussHaddix. Post-operatively, she was hypotensive which improved with IVF. Post-op Hgb was low around 10 but remained stable. Orthostatic VS was negative. She worked with PT OT who recommended SNF.   Discharge Diagnoses:  Principal Problem:   Closed left hip fracture (HCC)   Left hip fracture after mechanical fall -S/p percutaneous fixation 2/10 with Dr. Jena GaussHaddix  -PT OT eval recommending SNF  -Lovenox for DVT ppx for 30 days  -WBAT LLE   Left third distal phalanx fracture -Xray: acute nondisplaced fracture involving the mid to distal shaft of the third distal phalanx -Per orthopedic surgery   Hypotension with post-op blood loss anemia -Low BP post-op, improved with IVF bolus. Hgb 13.5 --> 10 post-op and stable currently. BP stable this morning. Orthostatic VS negative   Recurrent UTI -On doxycycline chronically for suppression   Hypothyroidism -Continue  synthroid  Oliguria -Improved UOP with IVF. Encourage oral hydration   Discharge Instructions  Discharge Instructions    Call MD for:  difficulty breathing, headache or visual disturbances   Complete by:  As directed    Call MD for:  extreme fatigue   Complete by:  As directed    Call MD for:  hives   Complete by:  As directed    Call MD for:  persistant dizziness or light-headedness   Complete by:  As directed    Call MD for:  persistant nausea and vomiting   Complete by:  As directed    Call MD for:  redness, tenderness, or signs of infection (pain, swelling, redness, odor or green/yellow discharge around incision site)   Complete by:  As directed    Call MD for:  severe uncontrolled pain   Complete by:  As directed    Call MD for:  temperature >100.4   Complete by:  As directed    Diet general   Complete by:  As directed    Discharge instructions   Complete by:  As directed    You were cared for by a hospitalist during your hospital stay. If you have any questions about your discharge medications or the care you received while you were in the hospital after you are discharged, you can call the unit and asked to speak with the hospitalist on call if the hospitalist that took care of you is not available. Once you are discharged, your primary care physician will handle any further medical issues. Please note that NO REFILLS for any discharge medications will be authorized once  you are discharged, as it is imperative that you return to your primary care physician (or establish a relationship with a primary care physician if you do not have one) for your aftercare needs so that they can reassess your need for medications and monitor your lab values.   Increase activity slowly   Complete by:  As directed      Allergies as of 11/04/2017      Reactions   Horse-derived Products Other (See Comments)   Loss of appetite   Macrodantin Other (See Comments)   Unknown reaction   Premarin  [estrogens, Conjugated] Other (See Comments)   Loss of appetite   Sulfa Antibiotics Other (See Comments)   Loss of appetite       Medication List    TAKE these medications   acetaminophen 500 MG tablet Commonly known as:  TYLENOL Take 500-1,000 mg by mouth every 6 (six) hours as needed (pain).   albuterol 108 (90 Base) MCG/ACT inhaler Commonly known as:  PROVENTIL HFA;VENTOLIN HFA Inhale 1 puff into the lungs 2 (two) times daily as needed for wheezing or shortness of breath.   doxycycline 100 MG tablet Commonly known as:  ADOXA Take 100 mg by mouth daily. Continuous for UTI prevention   enoxaparin 30 MG/0.3ML injection Commonly known as:  LOVENOX Inject 0.3 mLs (30 mg total) into the skin daily.   estradiol 0.1 MG/GM vaginal cream Commonly known as:  ESTRACE Place 1 Applicatorful vaginally at bedtime as needed (dryness).   levothyroxine 75 MCG tablet Commonly known as:  SYNTHROID, LEVOTHROID Take 75 mcg by mouth daily.   loperamide 2 MG capsule Commonly known as:  IMODIUM Take 2 mg by mouth as needed for diarrhea or loose stools.   OVER THE COUNTER MEDICATION Take 1 tablet by mouth daily. Vitamin B12 or D3?   traMADol 50 MG tablet Commonly known as:  ULTRAM Take 1 tablet (50 mg total) by mouth every 6 (six) hours as needed for moderate pain.      Follow-up Information    Haddix, Gillie Manners, MD. Schedule an appointment as soon as possible for a visit in 2 week(s).   Specialty:  Orthopedic Surgery Contact information: [address] [place] [address] [phone]        Martha Clan, MD. Schedule an appointment as soon as possible for a visit in 1 week(s).   Specialty:  Internal Medicine Contact information: [address] [place] [address] [phone]          Allergies  Allergen Reactions  . Horse-Derived Products Other (See Comments)    Loss of appetite  . Macrodantin Other (See Comments)    Unknown reaction  . Premarin  [Estrogens, Conjugated] Other (See Comments)    Loss of appetite  . Sulfa Antibiotics Other (See Comments)    Loss of appetite     Consultations:  Orthopedic surgery    Procedures/Studies: Dg Chest 1 View  Result Date: 10/31/2017 CLINICAL DATA:  Larey Seat yesterday.  Left hip fracture. EXAM: CHEST 1 VIEW COMPARISON:  09/21/2013 FINDINGS: Cardiac silhouette is normal in size. No mediastinal or hilar masses. No convincing adenopathy. Lungs show prominent bronchovascular markings. There are left infrahilar calcified nodes associated with left lower lobe calcified granuloma, stable from prior studies. No evidence of pneumonia or pulmonary edema. No convincing pleural effusion.  No pneumothorax. Skeletal structures are demineralized. There old healed left rib fractures. IMPRESSION: No active disease. Electronically Signed   By: Renard Hamper.D.  On: 10/31/2017 16:31   Ct Head Wo Contrast  Result Date: 10/31/2017 CLINICAL DATA:  Cervical spine trauma, head trauma EXAM: CT HEAD WITHOUT CONTRAST CT CERVICAL SPINE WITHOUT CONTRAST TECHNIQUE: Multidetector CT imaging of the head and cervical spine was performed following the standard protocol without intravenous contrast. Multiplanar CT image reconstructions of the cervical spine were also generated. COMPARISON:  No acute intracranial hemorrhage. No focal mass lesion. No CT evidence of acute infarction. No midline shift or mass effect. No hydrocephalus. Basilar cisterns are patent. FINDINGS: CT HEAD FINDINGS Brain: No acute intracranial hemorrhage. No focal mass lesion. No CT evidence of acute infarction. No midline shift or mass effect. No hydrocephalus. Basilar cisterns are patent. There are periventricular and subcortical white matter hypodensities. Generalized cortical atrophy. Vascular: No hyperdense vessel or unexpected calcification. Skull: Normal. Negative for fracture or focal lesion. Sinuses/Orbits: Paranasal sinuses and mastoid air cells are clear.  Orbits are clear. Other: None. CT CERVICAL SPINE FINDINGS Alignment: Normal alignment of the cervical vertebral bodies. Skull base and vertebrae: Normal craniocervical junction. No loss of vertebral body height or disc height. Normal facet articulation. No evidence of fracture. Soft tissues and spinal canal: No prevertebral soft tissue swelling. No perispinal or epidural hematoma. Disc levels:  Unremarkable Upper chest: Clear Other: None IMPRESSION: 1. No intracranial trauma 2. Atrophy disease and white matter microvascular 3. No cervical spine fracture. Electronically Signed   By: Genevive Bi M.D.   On: 10/31/2017 17:47   Ct Cervical Spine Wo Contrast  Result Date: 10/31/2017 CLINICAL DATA:  Cervical spine trauma, head trauma EXAM: CT HEAD WITHOUT CONTRAST CT CERVICAL SPINE WITHOUT CONTRAST TECHNIQUE: Multidetector CT imaging of the head and cervical spine was performed following the standard protocol without intravenous contrast. Multiplanar CT image reconstructions of the cervical spine were also generated. COMPARISON:  No acute intracranial hemorrhage. No focal mass lesion. No CT evidence of acute infarction. No midline shift or mass effect. No hydrocephalus. Basilar cisterns are patent. FINDINGS: CT HEAD FINDINGS Brain: No acute intracranial hemorrhage. No focal mass lesion. No CT evidence of acute infarction. No midline shift or mass effect. No hydrocephalus. Basilar cisterns are patent. There are periventricular and subcortical white matter hypodensities. Generalized cortical atrophy. Vascular: No hyperdense vessel or unexpected calcification. Skull: Normal. Negative for fracture or focal lesion. Sinuses/Orbits: Paranasal sinuses and mastoid air cells are clear. Orbits are clear. Other: None. CT CERVICAL SPINE FINDINGS Alignment: Normal alignment of the cervical vertebral bodies. Skull base and vertebrae: Normal craniocervical junction. No loss of vertebral body height or disc height. Normal facet  articulation. No evidence of fracture. Soft tissues and spinal canal: No prevertebral soft tissue swelling. No perispinal or epidural hematoma. Disc levels:  Unremarkable Upper chest: Clear Other: None IMPRESSION: 1. No intracranial trauma 2. Atrophy disease and white matter microvascular 3. No cervical spine fracture. Electronically Signed   By: Genevive Bi M.D.   On: 10/31/2017 17:47   Dg Finger Middle Left  Result Date: 10/31/2017 CLINICAL DATA:  Fall with hip fracture and left middle finger pain bruising to the distal phalanx EXAM: LEFT MIDDLE FINGER 2+V COMPARISON:  None. FINDINGS: Osteopenia is present. Old appearing fourth metacarpal fracture. Joint space narrowing at the DIP and PIP joints. Possible small erosion at the base of the third proximal phalanx. Acute nondisplaced fracture involving the mid to distal shaft of the third distal phalanx. IMPRESSION: 1. Osteopenia. Suspected acute nondisplaced fracture involving the mid to distal shaft of the third distal phalanx 2. Arthritis of the  DIP and PIP joints. Probable erosion at the base of the third proximal phalanx suggesting inflammatory/erosive arthritis 3. Old appearing fourth metacarpal fracture Electronically Signed   By: Jasmine Pang M.D.   On: 10/31/2017 18:35   Dg C-arm 1-60 Min  Result Date: 11/01/2017 CLINICAL DATA:  Pin fixation for femoral neck fracture EXAM: DG C-ARM 61-120 MIN; DG HIP  2-3V LEFT COMPARISON:  Pelvis and left hip images October 31, 2017 FLUOROSCOPY TIME:  1 minute 18 seconds; 8 acquired images FINDINGS: Frontal and lateral views obtained. Subcapital femoral neck fracture on the left again noted with impaction. There are 3 screws transfixing this fracture with alignment anatomic. Tips of the screws are in the proximal femoral head. No new fracture. No dislocation. Mild narrowing left hip joint. IMPRESSION: Three screws transfix a subcapital femoral neck fracture on the left with alignment near anatomic. Tips of the  screws in the proximal femoral head. No dislocation. No new fracture. Electronically Signed   By: Bretta Bang III M.D.   On: 11/01/2017 15:00   Dg Hip Unilat With Pelvis 1v Left  Result Date: 11/01/2017 CLINICAL DATA:  Postop hip pinning EXAM: DG HIP (WITH OR WITHOUT PELVIS) 1V*L* COMPARISON:  10/31/2017 FINDINGS: Three screws placed across the left femoral neck fracture. No hardware complicating feature. Anatomic alignment. IMPRESSION: Internal fixation across the left femoral neck fracture. No complicating feature. Electronically Signed   By: Charlett Nose M.D.   On: 11/01/2017 15:54   Dg Hip Unilat With Pelvis 2-3 Views Left  Result Date: 11/01/2017 CLINICAL DATA:  Pin fixation for femoral neck fracture EXAM: DG C-ARM 61-120 MIN; DG HIP  2-3V LEFT COMPARISON:  Pelvis and left hip images October 31, 2017 FLUOROSCOPY TIME:  1 minute 18 seconds; 8 acquired images FINDINGS: Frontal and lateral views obtained. Subcapital femoral neck fracture on the left again noted with impaction. There are 3 screws transfixing this fracture with alignment anatomic. Tips of the screws are in the proximal femoral head. No new fracture. No dislocation. Mild narrowing left hip joint. IMPRESSION: Three screws transfix a subcapital femoral neck fracture on the left with alignment near anatomic. Tips of the screws in the proximal femoral head. No dislocation. No new fracture. Electronically Signed   By: Bretta Bang III M.D.   On: 11/01/2017 15:00   Dg Hip Unilat W Or Wo Pelvis 2-3 Views Left  Result Date: 10/31/2017 CLINICAL DATA:  Pain following fall EXAM: DG HIP (WITH OR WITHOUT PELVIS) 2-3V LEFT COMPARISON:  None. FINDINGS: Frontal pelvis as well as frontal and lateral left hip images were obtained. There is evidence of an impacted subcapital femoral neck fracture on the left. There is evidence of an old fracture of the left ischium with remodeling. No other evidence of fracture. No dislocation. There is mild  symmetric narrowing of both hip joints. No erosive change. Rectum appears mildly distended with stool. IMPRESSION: Impacted subcapital femoral neck fracture on the left. Old healed fracture left ischium. No dislocation. Symmetric narrowing both hip joints. Electronically Signed   By: Bretta Bang III M.D.   On: 10/31/2017 14:48      Discharge Exam: Vitals:   11/03/17 1335 11/04/17 0410  BP: (!) 97/47 (!) 135/91  Pulse: 89 88  Resp:  18  Temp: 98.3 F (36.8 C) 97.8 F (36.6 C)  SpO2: 95% 98%   Vitals:   11/03/17 0900 11/03/17 1330 11/03/17 1335 11/04/17 0410  BP: (!) 95/54 (!) 105/52 (!) 97/47 (!) 135/91  Pulse: 73  75 89 88  Resp: 18   18  Temp: 98 F (36.7 C)  98.3 F (36.8 C) 97.8 F (36.6 C)  TempSrc: Oral  Oral Oral  SpO2: 98%  95% 98%  Weight:      Height:        General: Pt is alert, awake, not in acute distress Cardiovascular: RRR, S1/S2 +, no rubs, no gallops Respiratory: CTA bilaterally, no wheezing, no rhonchi Abdominal: Soft, NT, ND, bowel sounds + Extremities: no edema, no cyanosis    The results of significant diagnostics from this hospitalization (including imaging, microbiology, ancillary and laboratory) are listed below for reference.     Microbiology: Recent Results (from the past 240 hour(s))  Surgical PCR screen     Status: None   Collection Time: 11/01/17  3:51 AM  Result Value Ref Range Status   MRSA, PCR NEGATIVE NEGATIVE Final   Staphylococcus aureus NEGATIVE NEGATIVE Final    Comment: (NOTE) The Xpert SA Assay (FDA approved for NASAL specimens in patients 72 years of age and older), is one component of a comprehensive surveillance program. It is not intended to diagnose infection nor to guide or monitor treatment. Performed at [facility], [address] [place], [place] [address]      Labs: BNP (last 3 results) No results for input(s): BNP in the last 8760 hours. Basic Metabolic Panel: Recent Labs  Lab 10/31/17 1541  11/01/17 0954 11/02/17 0744  NA 135 136 135  K 4.1 4.2 3.9  CL 102 105 106  CO2 20* 19* 20*  GLUCOSE 109* 82 96  BUN 18 19 17   CREATININE 0.79 0.91 0.79  CALCIUM 9.5 9.3 8.3*   Liver Function Tests: Recent Labs  Lab 10/31/17 1541 11/01/17 0954  AST 27 23  ALT 19 15  ALKPHOS 65 57  BILITOT 2.1* 1.6*  PROT 7.1 6.2*  ALBUMIN 4.0 3.6   No results for input(s): LIPASE, AMYLASE in the last 168 hours. No results for input(s): AMMONIA in the last 168 hours. CBC: Recent Labs  Lab 10/31/17 1541 11/01/17 0954 11/02/17 0744 11/02/17 1448 11/03/17 0520 11/04/17 0512  WBC 9.6 8.0 6.8  --  7.1 5.4  HGB 13.5 12.4 10.4* 9.8* 10.0* 9.7*  HCT 39.4 37.9 31.2* 30.0* 30.3* 29.3*  MCV 94.0 95.2 96.0  --  94.7 93.9  PLT 244 211 209  --  218 217   Cardiac Enzymes: No results for input(s): CKTOTAL, CKMB, CKMBINDEX, TROPONINI in the last 168 hours. BNP: Invalid input(s): POCBNP CBG: No results for input(s): GLUCAP in the last 168 hours. D-Dimer No results for input(s): DDIMER in the last 72 hours. Hgb A1c No results for input(s): HGBA1C in the last 72 hours. Lipid Profile No results for input(s): CHOL, HDL, LDLCALC, TRIG, CHOLHDL, LDLDIRECT in the last 72 hours. Thyroid function studies No results for input(s): TSH, T4TOTAL, T3FREE, THYROIDAB in the last 72 hours.  Invalid input(s): FREET3 Anemia work up No results for input(s): VITAMINB12, FOLATE, FERRITIN, TIBC, IRON, RETICCTPCT in the last 72 hours. Urinalysis    Component Value Date/Time   COLORURINE YELLOW 05/10/2014 0818   APPEARANCEUR CLEAR 05/10/2014 0818   LABSPEC 1.009 05/10/2014 0818   PHURINE 7.0 05/10/2014 0818   GLUCOSEU NEGATIVE 05/10/2014 0818   HGBUR TRACE (A) 05/10/2014 0818   BILIRUBINUR NEGATIVE 05/10/2014 0818   KETONESUR NEGATIVE 05/10/2014 0818   PROTEINUR 30 (A) 05/10/2014 0818   UROBILINOGEN 0.2 05/10/2014 0818   NITRITE NEGATIVE 05/10/2014 0818   LEUKOCYTESUR  TRACE (A) 05/10/2014 0818   Sepsis  Labs Invalid input(s): PROCALCITONIN,  WBC,  LACTICIDVEN Microbiology Recent Results (from the past 240 hour(s))  Surgical PCR screen     Status: None   Collection Time: 11/01/17  3:51 AM  Result Value Ref Range Status   MRSA, PCR NEGATIVE NEGATIVE Final   Staphylococcus aureus NEGATIVE NEGATIVE Final    Comment: (NOTE) The Xpert SA Assay (FDA approved for NASAL specimens in patients 22 years of age and older), is one component of a comprehensive surveillance program. It is not intended to diagnose infection nor to guide or monitor treatment. Performed at [facility], [address] [place], [place] [address]      Time coordinating discharge: 40 minutes  SIGNED:  Noralee Stain, DO [facility] Pager [phone]  If 7PM-7AM, please contact night-coverage [email or web address] Password [email or web address] 11/04/2017, 10:02 AM

## 2017-11-04 NOTE — Social Work (Signed)
Clinical Social Worker facilitated patient discharge including contacting patient family and facility to confirm patient discharge plans.  Clinical information faxed to facility and family agreeable with plan.  CSW [facility].  RN to call [phone] with report  prior to discharge.  Clinical Social Worker will sign off for now as social work intervention is no longer needed. Please consult us again if new need arises.  Teresa Callahan, LCSWA Clinical Social Worker

## 2017-11-04 NOTE — Care Management Important Message (Signed)
Important Message  Patient Details  Name: Teresa GuileMargery O Urenda MRN: [id number] Date of Birth: May 15, 1918   Medicare Important Message Given:  Yes    Kal Chait 11/04/2017, 1:30 PM

## 2017-11-04 NOTE — Clinical Social Work Placement (Addendum)
   CLINICAL SOCIAL WORK PLACEMENT  NOTE [name] RN to call report to [phone]  Date:  11/04/2017  Patient Details  Name: Teresa Callahan MRN: [id number] Date of Birth: [birth year]  Clinical Social Work is seeking post-discharge placement for this patient at the Skilled  Nursing Facility level of care (*CSW will initial, date and re-position this form in  chart as items are completed):  Yes   Patient/family provided with [facility] Clinical Social Work Department's list of facilities offering this level of care within the geographic area requested by the patient (or if unable, by the patient's family).  Yes   Patient/family informed of their freedom to choose among providers that offer the needed level of care, that participate in Medicare, Medicaid or managed care program needed by the patient, have an available bed and are willing to accept the patient.  Yes   Patient/family informed of [name]'s ownership interest in [facility] and [name], as well as of the fact that they are under no obligation to receive care at these facilities.  PASRR submitted to EDS on       PASRR number received on       Existing PASRR number confirmed on 11/02/17     FL2 transmitted to all facilities in geographic area requested by pt/family on 11/02/17     FL2 transmitted to all facilities within larger geographic area on       Patient informed that his/her managed care company has contracts with or will negotiate with certain facilities, including the following:        Yes   Patient/family informed of bed offers received.  [name]     Physician recommends and patient chooses bed at      [name].  Patient to be transferred to facility by [facility]     Patient family notified on 11/04/17 of transfer.  Name of family member notified:   [name] & [name] (children) at  bedside  PHYSICIAN Please prepare priority discharge summary, including medications     Additional Comment:    _______________________________________________ Doy HutchingIsabel H Marquetta Weiskopf, LCSWA 11/04/2017, 9:47 AM

## 2017-11-06 DIAGNOSIS — E039 Hypothyroidism, unspecified: Secondary | ICD-10-CM | POA: Diagnosis not present

## 2017-11-06 DIAGNOSIS — S72002D Fracture of unspecified part of neck of left femur, subsequent encounter for closed fracture with routine healing: Secondary | ICD-10-CM | POA: Diagnosis not present

## 2017-11-06 DIAGNOSIS — D509 Iron deficiency anemia, unspecified: Secondary | ICD-10-CM | POA: Diagnosis not present

## 2017-11-06 DIAGNOSIS — N390 Urinary tract infection, site not specified: Secondary | ICD-10-CM | POA: Diagnosis not present

## 2017-11-17 DIAGNOSIS — S72032D Displaced midcervical fracture of left femur, subsequent encounter for closed fracture with routine healing: Secondary | ICD-10-CM | POA: Diagnosis not present

## 2017-11-26 DIAGNOSIS — S72012D Unspecified intracapsular fracture of left femur, subsequent encounter for closed fracture with routine healing: Secondary | ICD-10-CM | POA: Diagnosis not present

## 2017-11-26 DIAGNOSIS — I959 Hypotension, unspecified: Secondary | ICD-10-CM | POA: Diagnosis not present

## 2017-11-26 DIAGNOSIS — J45909 Unspecified asthma, uncomplicated: Secondary | ICD-10-CM | POA: Diagnosis not present

## 2017-11-26 DIAGNOSIS — M810 Age-related osteoporosis without current pathological fracture: Secondary | ICD-10-CM | POA: Diagnosis not present

## 2017-11-26 DIAGNOSIS — S62633D Displaced fracture of distal phalanx of left middle finger, subsequent encounter for fracture with routine healing: Secondary | ICD-10-CM | POA: Diagnosis not present

## 2017-11-26 DIAGNOSIS — E039 Hypothyroidism, unspecified: Secondary | ICD-10-CM | POA: Diagnosis not present

## 2017-11-30 DIAGNOSIS — E039 Hypothyroidism, unspecified: Secondary | ICD-10-CM | POA: Diagnosis not present

## 2017-11-30 DIAGNOSIS — S62633D Displaced fracture of distal phalanx of left middle finger, subsequent encounter for fracture with routine healing: Secondary | ICD-10-CM | POA: Diagnosis not present

## 2017-11-30 DIAGNOSIS — J45909 Unspecified asthma, uncomplicated: Secondary | ICD-10-CM | POA: Diagnosis not present

## 2017-11-30 DIAGNOSIS — S72012D Unspecified intracapsular fracture of left femur, subsequent encounter for closed fracture with routine healing: Secondary | ICD-10-CM | POA: Diagnosis not present

## 2017-11-30 DIAGNOSIS — M810 Age-related osteoporosis without current pathological fracture: Secondary | ICD-10-CM | POA: Diagnosis not present

## 2017-11-30 DIAGNOSIS — I959 Hypotension, unspecified: Secondary | ICD-10-CM | POA: Diagnosis not present

## 2017-12-01 DIAGNOSIS — S72012D Unspecified intracapsular fracture of left femur, subsequent encounter for closed fracture with routine healing: Secondary | ICD-10-CM | POA: Diagnosis not present

## 2017-12-01 DIAGNOSIS — E871 Hypo-osmolality and hyponatremia: Secondary | ICD-10-CM | POA: Diagnosis not present

## 2017-12-01 DIAGNOSIS — R300 Dysuria: Secondary | ICD-10-CM | POA: Diagnosis not present

## 2017-12-01 DIAGNOSIS — R634 Abnormal weight loss: Secondary | ICD-10-CM | POA: Diagnosis not present

## 2017-12-01 DIAGNOSIS — D509 Iron deficiency anemia, unspecified: Secondary | ICD-10-CM | POA: Diagnosis not present

## 2017-12-01 DIAGNOSIS — N390 Urinary tract infection, site not specified: Secondary | ICD-10-CM | POA: Diagnosis not present

## 2017-12-01 DIAGNOSIS — Z681 Body mass index (BMI) 19 or less, adult: Secondary | ICD-10-CM | POA: Diagnosis not present

## 2017-12-01 DIAGNOSIS — S62623A Displaced fracture of medial phalanx of left middle finger, initial encounter for closed fracture: Secondary | ICD-10-CM | POA: Diagnosis not present

## 2017-12-01 DIAGNOSIS — I959 Hypotension, unspecified: Secondary | ICD-10-CM | POA: Diagnosis not present

## 2017-12-01 DIAGNOSIS — R34 Anuria and oliguria: Secondary | ICD-10-CM | POA: Diagnosis not present

## 2017-12-01 DIAGNOSIS — S62633D Displaced fracture of distal phalanx of left middle finger, subsequent encounter for fracture with routine healing: Secondary | ICD-10-CM | POA: Diagnosis not present

## 2017-12-01 DIAGNOSIS — S72002A Fracture of unspecified part of neck of left femur, initial encounter for closed fracture: Secondary | ICD-10-CM | POA: Diagnosis not present

## 2017-12-01 DIAGNOSIS — M810 Age-related osteoporosis without current pathological fracture: Secondary | ICD-10-CM | POA: Diagnosis not present

## 2017-12-01 DIAGNOSIS — E039 Hypothyroidism, unspecified: Secondary | ICD-10-CM | POA: Diagnosis not present

## 2017-12-01 DIAGNOSIS — E038 Other specified hypothyroidism: Secondary | ICD-10-CM | POA: Diagnosis not present

## 2017-12-01 DIAGNOSIS — J45909 Unspecified asthma, uncomplicated: Secondary | ICD-10-CM | POA: Diagnosis not present

## 2017-12-02 DIAGNOSIS — E039 Hypothyroidism, unspecified: Secondary | ICD-10-CM | POA: Diagnosis not present

## 2017-12-02 DIAGNOSIS — J45909 Unspecified asthma, uncomplicated: Secondary | ICD-10-CM | POA: Diagnosis not present

## 2017-12-02 DIAGNOSIS — S72012D Unspecified intracapsular fracture of left femur, subsequent encounter for closed fracture with routine healing: Secondary | ICD-10-CM | POA: Diagnosis not present

## 2017-12-02 DIAGNOSIS — I959 Hypotension, unspecified: Secondary | ICD-10-CM | POA: Diagnosis not present

## 2017-12-02 DIAGNOSIS — S62633D Displaced fracture of distal phalanx of left middle finger, subsequent encounter for fracture with routine healing: Secondary | ICD-10-CM | POA: Diagnosis not present

## 2017-12-02 DIAGNOSIS — M810 Age-related osteoporosis without current pathological fracture: Secondary | ICD-10-CM | POA: Diagnosis not present

## 2017-12-07 DIAGNOSIS — S72012D Unspecified intracapsular fracture of left femur, subsequent encounter for closed fracture with routine healing: Secondary | ICD-10-CM | POA: Diagnosis not present

## 2017-12-07 DIAGNOSIS — S62633D Displaced fracture of distal phalanx of left middle finger, subsequent encounter for fracture with routine healing: Secondary | ICD-10-CM | POA: Diagnosis not present

## 2017-12-07 DIAGNOSIS — E039 Hypothyroidism, unspecified: Secondary | ICD-10-CM | POA: Diagnosis not present

## 2017-12-07 DIAGNOSIS — J45909 Unspecified asthma, uncomplicated: Secondary | ICD-10-CM | POA: Diagnosis not present

## 2017-12-07 DIAGNOSIS — I959 Hypotension, unspecified: Secondary | ICD-10-CM | POA: Diagnosis not present

## 2017-12-07 DIAGNOSIS — M810 Age-related osteoporosis without current pathological fracture: Secondary | ICD-10-CM | POA: Diagnosis not present

## 2017-12-08 DIAGNOSIS — M25562 Pain in left knee: Secondary | ICD-10-CM | POA: Diagnosis not present

## 2017-12-08 DIAGNOSIS — S72032D Displaced midcervical fracture of left femur, subsequent encounter for closed fracture with routine healing: Secondary | ICD-10-CM | POA: Diagnosis not present

## 2017-12-09 DIAGNOSIS — M810 Age-related osteoporosis without current pathological fracture: Secondary | ICD-10-CM | POA: Diagnosis not present

## 2017-12-09 DIAGNOSIS — S72012D Unspecified intracapsular fracture of left femur, subsequent encounter for closed fracture with routine healing: Secondary | ICD-10-CM | POA: Diagnosis not present

## 2017-12-09 DIAGNOSIS — S62633D Displaced fracture of distal phalanx of left middle finger, subsequent encounter for fracture with routine healing: Secondary | ICD-10-CM | POA: Diagnosis not present

## 2017-12-09 DIAGNOSIS — E039 Hypothyroidism, unspecified: Secondary | ICD-10-CM | POA: Diagnosis not present

## 2017-12-09 DIAGNOSIS — J45909 Unspecified asthma, uncomplicated: Secondary | ICD-10-CM | POA: Diagnosis not present

## 2017-12-09 DIAGNOSIS — I959 Hypotension, unspecified: Secondary | ICD-10-CM | POA: Diagnosis not present

## 2017-12-11 ENCOUNTER — Encounter (HOSPITAL_COMMUNITY): Payer: Self-pay

## 2017-12-11 ENCOUNTER — Inpatient Hospital Stay (HOSPITAL_COMMUNITY): Payer: Medicare Other

## 2017-12-11 ENCOUNTER — Emergency Department (HOSPITAL_COMMUNITY): Payer: Medicare Other

## 2017-12-11 ENCOUNTER — Inpatient Hospital Stay (HOSPITAL_COMMUNITY)
Admission: EM | Admit: 2017-12-11 | Discharge: 2017-12-17 | DRG: 057 | Disposition: A | Discharge status: Hospice | Payer: Medicare Other | Attending: Family Medicine | Admitting: Family Medicine

## 2017-12-11 DIAGNOSIS — K219 Gastro-esophageal reflux disease without esophagitis: Secondary | ICD-10-CM | POA: Diagnosis present

## 2017-12-11 DIAGNOSIS — I639 Cerebral infarction, unspecified: Secondary | ICD-10-CM

## 2017-12-11 DIAGNOSIS — R471 Dysarthria and anarthria: Secondary | ICD-10-CM | POA: Diagnosis present

## 2017-12-11 DIAGNOSIS — R109 Unspecified abdominal pain: Secondary | ICD-10-CM

## 2017-12-11 DIAGNOSIS — J4520 Mild intermittent asthma, uncomplicated: Secondary | ICD-10-CM | POA: Diagnosis not present

## 2017-12-11 DIAGNOSIS — E538 Deficiency of other specified B group vitamins: Secondary | ICD-10-CM | POA: Diagnosis present

## 2017-12-11 DIAGNOSIS — M810 Age-related osteoporosis without current pathological fracture: Secondary | ICD-10-CM | POA: Diagnosis present

## 2017-12-11 DIAGNOSIS — S72002A Fracture of unspecified part of neck of left femur, initial encounter for closed fracture: Secondary | ICD-10-CM | POA: Diagnosis present

## 2017-12-11 DIAGNOSIS — I63233 Cerebral infarction due to unspecified occlusion or stenosis of bilateral carotid arteries: Secondary | ICD-10-CM | POA: Diagnosis not present

## 2017-12-11 DIAGNOSIS — I6521 Occlusion and stenosis of right carotid artery: Secondary | ICD-10-CM | POA: Diagnosis present

## 2017-12-11 DIAGNOSIS — W19XXXD Unspecified fall, subsequent encounter: Secondary | ICD-10-CM | POA: Diagnosis present

## 2017-12-11 DIAGNOSIS — I6523 Occlusion and stenosis of bilateral carotid arteries: Secondary | ICD-10-CM | POA: Diagnosis not present

## 2017-12-11 DIAGNOSIS — R634 Abnormal weight loss: Secondary | ICD-10-CM | POA: Diagnosis not present

## 2017-12-11 DIAGNOSIS — J439 Emphysema, unspecified: Secondary | ICD-10-CM | POA: Diagnosis not present

## 2017-12-11 DIAGNOSIS — I700 Atherosclerosis of aorta: Secondary | ICD-10-CM | POA: Diagnosis not present

## 2017-12-11 DIAGNOSIS — J189 Pneumonia, unspecified organism: Secondary | ICD-10-CM

## 2017-12-11 DIAGNOSIS — S72002D Fracture of unspecified part of neck of left femur, subsequent encounter for closed fracture with routine healing: Secondary | ICD-10-CM

## 2017-12-11 DIAGNOSIS — R933 Abnormal findings on diagnostic imaging of other parts of digestive tract: Secondary | ICD-10-CM | POA: Diagnosis not present

## 2017-12-11 DIAGNOSIS — I6529 Occlusion and stenosis of unspecified carotid artery: Secondary | ICD-10-CM | POA: Diagnosis not present

## 2017-12-11 DIAGNOSIS — K117 Disturbances of salivary secretion: Secondary | ICD-10-CM | POA: Diagnosis not present

## 2017-12-11 DIAGNOSIS — Z7189 Other specified counseling: Secondary | ICD-10-CM | POA: Diagnosis not present

## 2017-12-11 DIAGNOSIS — Z79899 Other long term (current) drug therapy: Secondary | ICD-10-CM

## 2017-12-11 DIAGNOSIS — J45909 Unspecified asthma, uncomplicated: Secondary | ICD-10-CM | POA: Diagnosis present

## 2017-12-11 DIAGNOSIS — R1310 Dysphagia, unspecified: Secondary | ICD-10-CM

## 2017-12-11 DIAGNOSIS — I69391 Dysphagia following cerebral infarction: Secondary | ICD-10-CM | POA: Diagnosis not present

## 2017-12-11 DIAGNOSIS — D509 Iron deficiency anemia, unspecified: Secondary | ICD-10-CM | POA: Diagnosis present

## 2017-12-11 DIAGNOSIS — Z87891 Personal history of nicotine dependence: Secondary | ICD-10-CM | POA: Diagnosis not present

## 2017-12-11 DIAGNOSIS — R1313 Dysphagia, pharyngeal phase: Secondary | ICD-10-CM | POA: Diagnosis not present

## 2017-12-11 DIAGNOSIS — D519 Vitamin B12 deficiency anemia, unspecified: Secondary | ICD-10-CM | POA: Diagnosis not present

## 2017-12-11 DIAGNOSIS — R531 Weakness: Secondary | ICD-10-CM | POA: Diagnosis present

## 2017-12-11 DIAGNOSIS — M5033 Other cervical disc degeneration, cervicothoracic region: Secondary | ICD-10-CM | POA: Diagnosis not present

## 2017-12-11 DIAGNOSIS — I517 Cardiomegaly: Secondary | ICD-10-CM | POA: Diagnosis not present

## 2017-12-11 DIAGNOSIS — R2990 Unspecified symptoms and signs involving the nervous system: Secondary | ICD-10-CM

## 2017-12-11 DIAGNOSIS — N390 Urinary tract infection, site not specified: Secondary | ICD-10-CM | POA: Diagnosis present

## 2017-12-11 DIAGNOSIS — R0602 Shortness of breath: Secondary | ICD-10-CM | POA: Diagnosis not present

## 2017-12-11 DIAGNOSIS — E039 Hypothyroidism, unspecified: Secondary | ICD-10-CM | POA: Diagnosis not present

## 2017-12-11 DIAGNOSIS — M4802 Spinal stenosis, cervical region: Secondary | ICD-10-CM | POA: Diagnosis not present

## 2017-12-11 DIAGNOSIS — G7000 Myasthenia gravis without (acute) exacerbation: Principal | ICD-10-CM | POA: Diagnosis present

## 2017-12-11 DIAGNOSIS — R05 Cough: Secondary | ICD-10-CM | POA: Diagnosis not present

## 2017-12-11 DIAGNOSIS — R29818 Other symptoms and signs involving the nervous system: Secondary | ICD-10-CM | POA: Diagnosis not present

## 2017-12-11 LAB — I-STAT CHEM 8, ED
BUN: 34 mg/dL — AB (ref 6–20)
Calcium, Ion: 1.21 mmol/L (ref 1.15–1.40)
Chloride: 101 mmol/L (ref 101–111)
Creatinine, Ser: 0.6 mg/dL (ref 0.44–1.00)
Glucose, Bld: 112 mg/dL — ABNORMAL HIGH (ref 65–99)
HCT: 35 % — ABNORMAL LOW (ref 36.0–46.0)
Hemoglobin: 11.9 g/dL — ABNORMAL LOW (ref 12.0–15.0)
Potassium: 4.6 mmol/L (ref 3.5–5.1)
SODIUM: 135 mmol/L (ref 135–145)
TCO2: 24 mmol/L (ref 22–32)

## 2017-12-11 LAB — COMPREHENSIVE METABOLIC PANEL
ALT: 11 U/L — ABNORMAL LOW (ref 14–54)
AST: 22 U/L (ref 15–41)
Albumin: 4 g/dL (ref 3.5–5.0)
Alkaline Phosphatase: 82 U/L (ref 38–126)
Anion gap: 9 (ref 5–15)
BUN: 35 mg/dL — AB (ref 6–20)
CO2: 24 mmol/L (ref 22–32)
Calcium: 9.4 mg/dL (ref 8.9–10.3)
Chloride: 100 mmol/L — ABNORMAL LOW (ref 101–111)
Creatinine, Ser: 0.7 mg/dL (ref 0.44–1.00)
GFR calc Af Amer: >60 mL/min (ref >60)
GFR calc non Af Amer: >60 mL/min (ref >60)
GLUCOSE: 116 mg/dL — AB (ref 65–99)
POTASSIUM: 4.5 mmol/L (ref 3.5–5.1)
Sodium: 133 mmol/L — ABNORMAL LOW (ref 135–145)
Total Bilirubin: 0.9 mg/dL (ref 0.3–1.2)
Total Protein: 6.6 g/dL (ref 6.5–8.1)

## 2017-12-11 LAB — CBG MONITORING, ED: Glucose-Capillary: 106 mg/dL — ABNORMAL HIGH (ref 65–99)

## 2017-12-11 LAB — I-STAT TROPONIN, ED: Troponin i, poc: 0 ng/mL (ref 0.00–0.08)

## 2017-12-11 LAB — DIFFERENTIAL
BASOS ABS: 0 10*3/uL (ref 0.0–0.1)
Basophils Relative: 0 %
EOS ABS: 0.1 10*3/uL (ref 0.0–0.7)
Eosinophils Relative: 1 %
LYMPHS ABS: 0.8 10*3/uL (ref 0.7–4.0)
Lymphocytes Relative: 8 %
Monocytes Absolute: 0.7 10*3/uL (ref 0.1–1.0)
Monocytes Relative: 7 %
NEUTROS PCT: 84 %
Neutro Abs: 8.3 10*3/uL — ABNORMAL HIGH (ref 1.7–7.7)

## 2017-12-11 LAB — CBC
HCT: 32.4 % — ABNORMAL LOW (ref 36.0–46.0)
HEMOGLOBIN: 10.7 g/dL — AB (ref 12.0–15.0)
MCH: 31.3 pg (ref 26.0–34.0)
MCHC: 33 g/dL (ref 30.0–36.0)
MCV: 94.7 fL (ref 78.0–100.0)
Platelets: 298 10*3/uL (ref 150–400)
RBC: 3.42 MIL/uL — ABNORMAL LOW (ref 3.87–5.11)
RDW: 15.9 % — AB (ref 11.5–15.5)
WBC: 9.8 10*3/uL (ref 4.0–10.5)

## 2017-12-11 LAB — GLUCOSE, CAPILLARY: Glucose-Capillary: 85 mg/dL (ref 65–99)

## 2017-12-11 LAB — PROTIME-INR
INR: 0.95
Prothrombin Time: 12.6 seconds (ref 11.4–15.2)

## 2017-12-11 LAB — APTT: APTT: 32 s (ref 24–36)

## 2017-12-11 MED ORDER — ONDANSETRON HCL 4 MG/2ML IJ SOLN: 4.0000 mg | Freq: Four times a day (QID) | INTRAMUSCULAR | Status: DC | PRN

## 2017-12-11 MED ORDER — SODIUM CHLORIDE 0.9 % IV SOLN
Freq: Once | INTRAVENOUS | Status: AC
  Administered 2017-12-11: 16:00:00 via INTRAVENOUS

## 2017-12-11 MED ORDER — ENOXAPARIN SODIUM 30 MG/0.3ML ~~LOC~~ SOLN
30.0000 mg | SUBCUTANEOUS | Status: DC
  Administered 2017-12-11 – 2017-12-16 (×6): 30 mg via SUBCUTANEOUS
  Filled 2017-12-11 (×6): qty 0.3

## 2017-12-11 MED ORDER — METOCLOPRAMIDE HCL 5 MG/ML IJ SOLN
5.0000 mg | Freq: Once | INTRAMUSCULAR | Status: AC
  Administered 2017-12-11: 5 mg via INTRAVENOUS
  Filled 2017-12-11: qty 2

## 2017-12-11 MED ORDER — IOPAMIDOL (ISOVUE-300) INJECTION 61%
INTRAVENOUS | Status: AC
  Filled 2017-12-11: qty 75

## 2017-12-11 MED ORDER — ONDANSETRON HCL 4 MG PO TABS
4.0000 mg | ORAL_TABLET | Freq: Four times a day (QID) | ORAL | Status: DC | PRN
Start: 2017-12-11 — End: 2017-12-17

## 2017-12-11 MED ORDER — IOPAMIDOL (ISOVUE-370) INJECTION 76%
50.0000 mL | Freq: Once | INTRAVENOUS | Status: AC | PRN
  Administered 2017-12-11: 50 mL via INTRAVENOUS

## 2017-12-11 MED ORDER — SODIUM CHLORIDE 0.9 % IV BOLUS (SEPSIS)
500.0000 mL | Freq: Once | INTRAVENOUS | Status: AC
  Administered 2017-12-11: 500 mL via INTRAVENOUS

## 2017-12-11 NOTE — ED Notes (Signed)
Respiratory @ bedside

## 2017-12-11 NOTE — Progress Notes (Signed)
NIT (-30) VC unable to do  3 attempts

## 2017-12-11 NOTE — Progress Notes (Signed)
Pt blood pressure noted to be decreased.  Pt mentation at baseline.  Pt alert to self and place.  Confused to time and situation but does state "I came because of my swallowing."  Denies dizziness, or SOB.  Linton FlemingsX. Blount NP notified.  VS as follows:    12/11/17 2254  Vitals  BP (!) 89/53  MAP (mmHg) (!) 64  Pulse Rate 71  ECG Heart Rate 80  Resp 17  Oxygen Therapy  SpO2 97 %   Order received for NS bolus.  Bolus started per orders.

## 2017-12-11 NOTE — Care Management Note (Signed)
Case Management Note  Patient Details  Name: Caralyn GuileMargery O Wallis MRN: [id number] Date of Birth: [birth year]  Subjective/Objective:                  84 year old female presents with a drooling, muffled voice and inability to swallow.  From home with son.   Action/Plan: Follow for disposition needs.   Expected Discharge Date:  (unknown)               Expected Discharge Plan:  Home w Hospice Care  In-House Referral:     Discharge planning Services  CM Consult  Post Acute Care Choice:  Hospice Choice offered to:     DME Arranged:    DME Agency:     HH Arranged:    HH [facility]  Status of Service:  In process, will continue to follow  If discussed at Long Length of Stay Meetings, dates discussed:    Additional Comments: [facility], transitioning to [facility] Hosp[facility].  Bambi aware that pt is here. Oletta CohnWood, Rhonin Trott, RN 12/11/2017, 1:47 PM

## 2017-12-11 NOTE — H&P (Signed)
History and Physical    Teresa Callahan:[id number] DOB: 04/27/1918 DOA: 12/11/2017  PCP: Martha Clan, MD Patient coming from: home  Chief Complaint: dysphagia  HPI: Teresa Callahan is a very pleasant 84 y.o. female with medical history significant for asthma, hypothyroidism, osteoporosis, GERD, carotid stenosis, ascending hip fracture presents to the emergency department with the chief complaint difficulty swallowing. Initial evaluation concerning for possible CVA for myasthenia gravis. [facility] hospitalists are asked to admit.  Information is obtained from the patient. Patient states she was in her usual state of health until this morning shortly after breakfast family noted she was having difficulty swallowing. Reportedly the family also noticed yesterday difficulty swallowing pills but today she was having trouble managing her saliva and was "drooling a lot". They also noted she had these symptoms to a much lesser degree several weeks ago when she was admitted for fractured hip. Morning is much worse and patient was brought to the hospital. ultimately code stroke was called but then canceled.she denies any headache dizziness syncope or near-syncope. She denies blurred vision chest pain palpitation shortness of breath. He states when she tries to swallow she "chokes and spits up". She denies abdominal pain nausea vomiting diarrhea constipation melena bright red blood per rectum. She denies dysuria hematuria frequency or urgency. Family does report decreased oral intake and she has had some unintentional weight loss.    ED Course: in the emergency department she's afebrile hemodynamically stable and not hypoxic.  Review of Systems: As per HPI otherwise all other systems reviewed and are negative.   Ambulatory Status:ambulates with a walker. Recently suffered hip fracture in surgical repair  Past Medical History:  Diagnosis Date  . Anemia   . Asthma   . Atrophic vaginitis   . Back pain     . Carotid stenosis   . Diverticulosis   . GERD (gastroesophageal reflux disease)   . History of blood transfusion 2014   "not related to a surgery"  . Hypothyroidism   . Incontinence   . Osteoporosis   . Pneumonia    "probably" (05/09/2014)  . Postmenopausal   . Thyroid disease   . UTI (lower urinary tract infection)   . Vertigo     Past Surgical History:  Procedure Laterality Date  . ABDOMINAL HYSTERECTOMY  1957  . CATARACT EXTRACTION W/ INTRAOCULAR LENS  IMPLANT, BILATERAL Bilateral   . COLONOSCOPY  09/30/2001  . EYE SURGERY Left    d/t complications from cataract surgery  . HIP PINNING,CANNULATED Left 11/01/2017  . HIP PINNING,CANNULATED Left 11/01/2017   Procedure: CANNULATED HIP PINNING;  Surgeon: Roby Lofts, MD;  Location: [facility] OR;  Service: Orthopedics;  Laterality: Left;  . TONSILLECTOMY      Social History   Socioeconomic History  . Marital status: Widowed    Spouse name: Not on file  . Number of children: Not on file  . Years of education: Not on file  . Highest education level: Not on file  Occupational History  . Occupation: retired IT consultant  Social Needs  . Financial resource strain: Not on file  . Food insecurity:    Worry: Not on file    Inability: Not on file  . Transportation needs:    Medical: Not on file    Non-medical: Not on file  Tobacco Use  . Smoking status: Former Smoker    Packs/day: 0.20    Years: 8.00    Pack years: 1.60    Types: Cigarettes  Last attempt to quit: 09/22/1948    Years since quitting: 69.2  . Smokeless tobacco: Never Used  Substance and Sexual Activity  . Alcohol use: Yes    Comment: 1 drink daily  . Drug use: No  . Sexual activity: Not on file  Lifestyle  . Physical activity:    Days per week: Not on file    Minutes per session: Not on file  . Stress: Not on file  Relationships  . Social connections:    Talks on phone: Not on file    Gets together: Not on file    Attends religious service: Not  on file    Active member of club or organization: Not on file    Attends meetings of clubs or organizations: Not on file    Relationship status: Not on file  . Intimate partner violence:    Fear of current or ex partner: Not on file    Emotionally abused: Not on file    Physically abused: Not on file    Forced sexual activity: Not on file  Other Topics Concern  . Not on file  Social History Narrative  . Not on file    Allergies  Allergen Reactions  . Horse-Derived Products Other (See Comments)    Loss of appetite  . Macrodantin Other (See Comments)    Unknown reaction  . Premarin [Estrogens, Conjugated] Other (See Comments)    Loss of appetite  . Sulfa Antibiotics Other (See Comments)    Loss of appetite     Family History  Problem Relation Age of Onset  . Colon cancer Mother   . Melanoma Father   . Lymphoma Son     Prior to Admission medications   Medication Sig Start Date End Date Taking? Authorizing Provider  acetaminophen (TYLENOL) 500 MG tablet Take 500-1,000 mg by mouth every 6 (six) hours as needed (pain).    [provider]  albuterol (PROVENTIL HFA;VENTOLIN HFA) 108 (90 BASE) MCG/ACT inhaler Inhale 1 puff into the lungs 2 (two) times daily as needed for wheezing or shortness of breath.    [provider]  doxycycline (ADOXA) 100 MG tablet Take 100 mg by mouth daily. Continuous for UTI prevention    [provider]  enoxaparin (LOVENOX) 30 MG/0.3ML injection Inject 0.3 mLs (30 mg total) into the skin daily. 11/03/17 12/03/17  Haddix, Gillie Manners, MD  estradiol (ESTRACE) 0.1 MG/GM vaginal cream Place 1 Applicatorful vaginally at bedtime as needed (dryness).    [provider]  levothyroxine (SYNTHROID, LEVOTHROID) 75 MCG tablet Take 75 mcg by mouth daily.    [provider]  loperamide (IMODIUM) 2 MG capsule Take 2 mg by mouth as needed for diarrhea or loose stools.    [provider]  OVER THE COUNTER MEDICATION Take 1  tablet by mouth daily. Vitamin B12 or D3?    [provider]  traMADol (ULTRAM) 50 MG tablet Take 1 tablet (50 mg total) by mouth every 6 (six) hours as needed for moderate pain. 11/03/17   Haddix, Gillie Manners, MD    Physical Exam: Vitals:   12/11/17 1301  BP: (!) 123/49  Pulse: 69  Resp: 16  Temp: 98.4 F (36.9 C)  TempSrc: Oral  SpO2: 99%  Weight: 54.4 kg (120 lb)  Height: 5' (1.524 m)     General:  Appears calm and comfortable but frail pale Eyes:  PERRL, EOMI, normal lids, iris ENT:  grossly normal hearing, lips & tongue, very poor dentition.  Mucous membranes of her mouth are pink and moist Neck:  no LAD, masses or thyromegaly Cardiovascular:  RRR, no m/r/g. No LE edema.  Respiratory:  CTA bilaterally, no w/r/r. Normal respiratory effort. Abdomen:  soft, ntnd, s a bowel sounds throughout no guarding or rebounding Skin:  no rash or induration seen on limited exam Musculoskeletal:  grossly normal tone BUE/BLE, good ROM, no bony abnormality Psychiatric:  grossly normal mood and affect, speech fluent and appropriate, AOx3 Neurologic:  CN 2-12 grossly intact, moves all extremities in coordinated fashion, sensation intact alert and oriented to self and place. Follows commands  Labs on Admission: I have personally reviewed following labs and imaging studies  CBC: Recent Labs  Lab 12/11/17 1344 12/11/17 1350  WBC 9.8  --   NEUTROABS 8.3*  --   HGB 10.7* 11.9*  HCT 32.4* 35.0*  MCV 94.7  --   PLT 298  --    Basic Metabolic Panel: Recent Labs  Lab 12/11/17 1344 12/11/17 1350  NA 133* 135  K 4.5 4.6  CL 100* 101  CO2 24  --   GLUCOSE 116* 112*  BUN 35* 34*  CREATININE 0.70 0.60  CALCIUM 9.4  --    GFR: Estimated Creatinine Clearance: 28.9 mL/min (by C-G formula based on SCr of 0.6 mg/dL). Liver Function Tests: Recent Labs  Lab 12/11/17 1344  AST 22  ALT 11*  ALKPHOS 82  BILITOT 0.9  PROT 6.6  ALBUMIN 4.0   No results for input(s): LIPASE, AMYLASE  in the last 168 hours. No results for input(s): AMMONIA in the last 168 hours. Coagulation Profile: Recent Labs  Lab 12/11/17 1344  INR 0.95   Cardiac Enzymes: No results for input(s): CKTOTAL, CKMB, CKMBINDEX, TROPONINI in the last 168 hours. BNP (last 3 results) No results for input(s): PROBNP in the last 8760 hours. HbA1C: No results for input(s): HGBA1C in the last 72 hours. CBG: Recent Labs  Lab 12/11/17 1350  GLUCAP 106*   Lipid Profile: No results for input(s): CHOL, HDL, LDLCALC, TRIG, CHOLHDL, LDLDIRECT in the last 72 hours. Thyroid Function Tests: No results for input(s): TSH, T4TOTAL, FREET4, T3FREE, THYROIDAB in the last 72 hours. Anemia Panel: No results for input(s): VITAMINB12, FOLATE, FERRITIN, TIBC, IRON, RETICCTPCT in the last 72 hours. Urine analysis:    Component Value Date/Time   COLORURINE YELLOW 05/10/2014 0818   APPEARANCEUR CLEAR 05/10/2014 0818   LABSPEC 1.009 05/10/2014 0818   PHURINE 7.0 05/10/2014 0818   GLUCOSEU NEGATIVE 05/10/2014 0818   HGBUR TRACE (A) 05/10/2014 0818   BILIRUBINUR NEGATIVE 05/10/2014 0818   KETONESUR NEGATIVE 05/10/2014 0818   PROTEINUR 30 (A) 05/10/2014 0818   UROBILINOGEN 0.2 05/10/2014 0818   NITRITE NEGATIVE 05/10/2014 0818   LEUKOCYTESUR TRACE (A) 05/10/2014 0818    Creatinine Clearance: Estimated Creatinine Clearance: 28.9 mL/min (by C-G formula based on SCr of 0.6 mg/dL).  Sepsis Labs: @LABRCNTIP (procalcitonin:4,lacticidven:4) )No results found for this or any previous visit (from the past 240 hour(s)).   Radiological Exams on Admission: Ct Angio Head W Or Wo Contrast  Result Date: 12/11/2017 CLINICAL DATA:  84 year old female with difficulty swallowing. Code stroke. Possible brainstem infarct. Recent weight loss. EXAM: CT ANGIOGRAPHY HEAD AND NECK TECHNIQUE: Multidetector CT imaging of the head and neck was performed using the standard protocol during bolus administration of intravenous contrast.  Multiplanar CT image reconstructions and MIPs were obtained to evaluate the vascular anatomy. Carotid stenosis measurements (when applicable) are obtained utilizing NASCET criteria, using the distal internal carotid  diameter as the denominator. CONTRAST:  50mL ISOVUE-370 IOPAMIDOL (ISOVUE-370) INJECTION 76% COMPARISON:  Head CT without contrast 1402 hours today. Brain MRI 05/10/2014. CT Abdomen and Pelvis 06/29/2013. FINDINGS: CTA NECK Skeleton: Advanced cervical spine degeneration. C3-C4 ankylosis. Upper thoracic spine degeneration. No acute osseous abnormality identified. Upper chest: Dilated thoracic esophagus up to 4.2 centimeters diameter with an air-fluid level of the thoracic inlet (series 5, image 143). The visible esophagus remains dilated to below the level of the carina. The distal esophagus on 06/29/2013 appear normal. Negative upper lungs.  No superior mediastinal lymphadenopathy. Other neck: Negative.  No neck mass or lymphadenopathy. Aortic arch: Calcified aortic atherosclerosis. Three vessel arch configuration. Right carotid system: No brachiocephalic or right CCA origin stenosis despite atherosclerosis. There is additional soft plaque in the right CCA, and bulky calcified plaque at the right carotid bifurcation. The right ICA is chronically occluded, unchanged since the 2015 brain MRI. No reconstituted flow in the neck. Left carotid system: No left CCA origin stenosis despite atherosclerosis. At the left carotid bifurcation there is soft and calcified plaque with proximal left ICA stenosis numerically estimated at 60-65 % with respect to the distal vessel (series 8, image 87). The left ICA remains patent to the skull base without additional stenosis. The left ECA origin is incidentally occluded at its origin, with reconstituted flow in left ECA branches. Vertebral arteries: No proximal right subclavian artery stenosis despite plaque. Calcified plaque at the right vertebral artery origin with  moderate to severe stenosis. The right vertebral then is patent to the skull base without additional stenosis. There is bulky soft plaque in the proximal left subclavian artery resulting in high-grade stenosis numerically estimated at 65-70 % with respect to the distal vessel. The left subclavian remains patent, and the left vertebral artery origin is patent without stenosis despite mild calcified plaque. The left vertebral artery is patent to the skull base without additional stenosis. Intermittent left vertebral tortuosity. CTA HEAD Posterior circulation: The distal vertebral arteries are patent. Both PICA origins are patent. There is mild left V4 segment plaque without stenosis. No right V4 segment stenosis. Patent vertebrobasilar junction. Patent basilar artery without plaque or stenosis. SCA and PCA origins are patent and within normal limits. Both posterior communicating arteries are present. The left PCA branches are within normal limits. There is moderate irregularity and stenosis in the right PCA P2 and P3 segments which appears due to atherosclerosis. Anterior circulation: The proximal right ICA siphon is occluded. The right ICA terminus appears reconstituted from the right posterior communicating artery. The left ICA siphon is patent without stenosis despite calcified plaque. Normal left ophthalmic and posterior communicating artery origins. Both carotid termini are patent. Both MCA and ACA origins are patent. The anterior communicating artery and bilateral ACA branches are within normal limits. The proximal left MCA M1 segment is normal but distally there is moderate to severe irregularity and stenosis (series 11, image 20). The left MCA trifurcation remains patent. There is only mild irregularity of left MCA branches. The right MCA proximal M1 segment is mildly irregular and stenotic. The distal right M1 appears normal. The right MCA bifurcation is patent. The right MCA branches are within normal  limits. Venous sinuses: Patent on the delayed images. Anatomic variants: None. Delayed phase: No abnormal enhancement identified. Stable gray-white matter differentiation throughout the brain. Review of the MIP images confirms the above findings IMPRESSION: 1. Negative for emergent large vessel occlusion. Patent basilar artery without atherosclerosis or stenosis. 2. Positive for atherosclerosis with significant  stenosis related to the intracranial circulation: - Left ICA origin moderate to severe stenosis (60-65%). - moderate to severe irregularity and stenosis in the distal Left M1 segment and trifurcation. - moderate to severe Right vertebral artery origin stenosis. - severe proximal Left subclavian artery stenosis due to soft plaque, stenosis (65-70%). 3. Chronic occlusion of the right ICA, stable since 2015, with reconstitution of the right ICA terminus from the right posterior communicating artery. 4. Dilated and obstructed appearing proximal thoracic esophagus. Consider benign versus malignant distal esophageal obstruction. 5. Stable CT appearance of the brain since 1402 hours today. Salient findings were discussed by telephone with Dr. Milon Dikes on 12/11/2017 beginning at 1541 hours. Electronically Signed   By: Odessa Fleming M.D.   On: 12/11/2017 15:55   Dg Chest 2 View  Result Date: 12/11/2017 CLINICAL DATA:  Dysphagia, short of breath EXAM: CHEST - 2 VIEW COMPARISON:  10/31/2017, 07/13/2012 FINDINGS: No focal pulmonary opacity or significant effusion. Stable mild cardiomegaly with aortic atherosclerosis. No pneumothorax. Gibbus deformity at the thoracolumbar junction with marked compression deformities, uncertain age but new since 2013. Calcified left hilar nodes. IMPRESSION: 1. Mild cardiomegaly without edema or infiltrate. 2. Kyphotic deformity at the thoracolumbar junction with marked compression deformities. Electronically Signed   By: Jasmine Pang M.D.   On: 12/11/2017 15:13   Ct Angio Neck W Or Wo  Contrast  Result Date: 12/11/2017 CLINICAL DATA:  84 year old female with difficulty swallowing. Code stroke. Possible brainstem infarct. Recent weight loss. EXAM: CT ANGIOGRAPHY HEAD AND NECK TECHNIQUE: Multidetector CT imaging of the head and neck was performed using the standard protocol during bolus administration of intravenous contrast. Multiplanar CT image reconstructions and MIPs were obtained to evaluate the vascular anatomy. Carotid stenosis measurements (when applicable) are obtained utilizing NASCET criteria, using the distal internal carotid diameter as the denominator. CONTRAST:  50mL ISOVUE-370 IOPAMIDOL (ISOVUE-370) INJECTION 76% COMPARISON:  Head CT without contrast 1402 hours today. Brain MRI 05/10/2014. CT Abdomen and Pelvis 06/29/2013. FINDINGS: CTA NECK Skeleton: Advanced cervical spine degeneration. C3-C4 ankylosis. Upper thoracic spine degeneration. No acute osseous abnormality identified. Upper chest: Dilated thoracic esophagus up to 4.2 centimeters diameter with an air-fluid level of the thoracic inlet (series 5, image 143). The visible esophagus remains dilated to below the level of the carina. The distal esophagus on 06/29/2013 appear normal. Negative upper lungs.  No superior mediastinal lymphadenopathy. Other neck: Negative.  No neck mass or lymphadenopathy. Aortic arch: Calcified aortic atherosclerosis. Three vessel arch configuration. Right carotid system: No brachiocephalic or right CCA origin stenosis despite atherosclerosis. There is additional soft plaque in the right CCA, and bulky calcified plaque at the right carotid bifurcation. The right ICA is chronically occluded, unchanged since the 2015 brain MRI. No reconstituted flow in the neck. Left carotid system: No left CCA origin stenosis despite atherosclerosis. At the left carotid bifurcation there is soft and calcified plaque with proximal left ICA stenosis numerically estimated at 60-65 % with respect to the distal vessel  (series 8, image 87). The left ICA remains patent to the skull base without additional stenosis. The left ECA origin is incidentally occluded at its origin, with reconstituted flow in left ECA branches. Vertebral arteries: No proximal right subclavian artery stenosis despite plaque. Calcified plaque at the right vertebral artery origin with moderate to severe stenosis. The right vertebral then is patent to the skull base without additional stenosis. There is bulky soft plaque in the proximal left subclavian artery resulting in high-grade stenosis numerically estimated at 65-70 %  with respect to the distal vessel. The left subclavian remains patent, and the left vertebral artery origin is patent without stenosis despite mild calcified plaque. The left vertebral artery is patent to the skull base without additional stenosis. Intermittent left vertebral tortuosity. CTA HEAD Posterior circulation: The distal vertebral arteries are patent. Both PICA origins are patent. There is mild left V4 segment plaque without stenosis. No right V4 segment stenosis. Patent vertebrobasilar junction. Patent basilar artery without plaque or stenosis. SCA and PCA origins are patent and within normal limits. Both posterior communicating arteries are present. The left PCA branches are within normal limits. There is moderate irregularity and stenosis in the right PCA P2 and P3 segments which appears due to atherosclerosis. Anterior circulation: The proximal right ICA siphon is occluded. The right ICA terminus appears reconstituted from the right posterior communicating artery. The left ICA siphon is patent without stenosis despite calcified plaque. Normal left ophthalmic and posterior communicating artery origins. Both carotid termini are patent. Both MCA and ACA origins are patent. The anterior communicating artery and bilateral ACA branches are within normal limits. The proximal left MCA M1 segment is normal but distally there is moderate  to severe irregularity and stenosis (series 11, image 20). The left MCA trifurcation remains patent. There is only mild irregularity of left MCA branches. The right MCA proximal M1 segment is mildly irregular and stenotic. The distal right M1 appears normal. The right MCA bifurcation is patent. The right MCA branches are within normal limits. Venous sinuses: Patent on the delayed images. Anatomic variants: None. Delayed phase: No abnormal enhancement identified. Stable gray-white matter differentiation throughout the brain. Review of the MIP images confirms the above findings IMPRESSION: 1. Negative for emergent large vessel occlusion. Patent basilar artery without atherosclerosis or stenosis. 2. Positive for atherosclerosis with significant stenosis related to the intracranial circulation: - Left ICA origin moderate to severe stenosis (60-65%). - moderate to severe irregularity and stenosis in the distal Left M1 segment and trifurcation. - moderate to severe Right vertebral artery origin stenosis. - severe proximal Left subclavian artery stenosis due to soft plaque, stenosis (65-70%). 3. Chronic occlusion of the right ICA, stable since 2015, with reconstitution of the right ICA terminus from the right posterior communicating artery. 4. Dilated and obstructed appearing proximal thoracic esophagus. Consider benign versus malignant distal esophageal obstruction. 5. Stable CT appearance of the brain since 1402 hours today. Salient findings were discussed by telephone with Dr. Milon Dikes on 12/11/2017 beginning at 1541 hours. Electronically Signed   By: Odessa Fleming M.D.   On: 12/11/2017 15:55   Ct C-spine No Charge  Result Date: 12/11/2017 CLINICAL DATA:  Dysphagia. EXAM: CT CERVICAL SPINE WITHOUT CONTRAST TECHNIQUE: Multidetector CT imaging of the cervical spine was performed without intravenous contrast. Multiplanar CT image reconstructions were also generated. COMPARISON:  10/31/2017 FINDINGS: Alignment: Normal  Skull base and vertebrae: No fracture or primary bone lesion. Soft tissues and spinal canal: No canal compromise. The esophagus is markedly distended and filled with fluid and gas in the region. The differential diagnosis is achalasia versus distal obstruction. Disc levels: Ordinary osteoarthritis at the C1-2 level. Fusion from C2 through C4. C4-5 facet osteoarthritis on the left with 2 mm of anterolisthesis. Bony left foraminal narrowing. C5-6 fusion. C6-7 degenerative spondylosis and facet arthritis without notable stenosis. C7-T1 and T1-2 facet arthritis without notable stenosis. Upper thoracic region shows non-compressive degenerative changes. Upper chest: Lung apices are clear. Other: None IMPRESSION: Dilated fluid and air-filled esophagus without causative lesion in  the region of the scan. This could be due to achalasia or distal obstruction. Degenerative changes of the spine as outlined above, seemingly unrelated to the complaint of dysphagia. Electronically Signed   By: Paulina Fusi M.D.   On: 12/11/2017 15:53   Ct Head Code Stroke Wo Contrast  Result Date: 12/11/2017 CLINICAL DATA:  Code stroke. 84 year old female with difficulty swallowing. EXAM: CT HEAD WITHOUT CONTRAST TECHNIQUE: Contiguous axial images were obtained from the base of the skull through the vertex without intravenous contrast. COMPARISON:  Head CT without contrast 10/31/2017. Brain MRI 05/10/2014 FINDINGS: Brain: Stable cerebral volume. Posterior fossa gray-white matter differentiation appears stable since February. Stable gray-white matter differentiation elsewhere with mild for age patchy cerebral white matter hypodensity. No midline shift, ventriculomegaly, mass effect, evidence of mass lesion, intracranial hemorrhage or evidence of cortically based acute infarction. No cortical encephalomalacia identified. Vascular: Calcified atherosclerosis at the skull base. No suspicious intracranial vascular hyperdensity. Skull: Stable.  No  acute osseous abnormality identified. Sinuses/Orbits: Visualized paranasal sinuses and mastoids are stable and well pneumatized. Other: No acute orbit or scalp soft tissue findings. ASPECTS [facility] Stroke Program Early CT Score) - Ganglionic level infarction (caudate, lentiform nuclei, internal capsule, insula, M1-M3 cortex): 7 - Supraganglionic infarction (M4-M6 cortex): 3 Total score (0-10 with 10 being normal): 10 IMPRESSION: 1. No acute intracranial abnormality and stable non contrast CT appearance of the brain since February. 2. ASPECTS is 10. 3. These results were communicated to Dr. Wilford Corner at 2:44 pmon 3/22/2019by text page via the [facility] messaging system. Electronically Signed   By: Odessa Fleming M.D.   On: 12/11/2017 14:44    EKG: Sinus rhythm Prolonged PR interval Right bundle branch block Anteroseptal infarct, age indeterminate Baseline wander in lead(s) V4  Assessment/Plan Principal Problem:   Dysphagia Active Problems:   ANEMIA, IRON DEFICIENCY   Asthma, chronic   Hypothyroidism   Carotid stenosis   Closed left hip fracture (HCC)   1. Dysphasia. Etiology uncertain.cT of the head without acute abnormalities. Neuro exam benign. Evaluated by neurology who opined presentation more consistent with myasthenia gravis then for stroke. Recommended admission MRI to rule out stroke -Admit -nothing by mouth -Follow MRI -neuro checks -ENT consultation -Gentle IV fluids -Speech therapy -may need gi consult? -appreciate neuro assistance -appreciate ENT assistance  #2.anemia. Hemoglobin stable. -Monitor  #3. closed hip fracture. Repaired surgically asked a month.  -continue Lovenox -Physical therapy  #4.asthma. Stable at baseline. His saturation level greater than 90% on room air -inhalers  DVT prophylaxis: lovenox  Code Status: full  Family Communication: none present  Disposition Plan: home  Consults called: aroor neurology ent  Admission status: inpatient    Gwenyth Bender  MD [facility]  If 7PM-7AM, please contact night-coverage [email or web address] Password [facility]  12/11/2017, 4:46 PM

## 2017-12-11 NOTE — Procedures (Signed)
Preop diagnosis: Pharyngeal dysphagia Postop diagnosis: same Procedure: Transnasal fiberoptic laryngoscopy Surgeon: Jenne PaneBates Anesth: Topical with 4% lidocaine Compl: None Findings: Normal anatomy of the pharynx and larynx.  Vocal folds symmetrically mobile. Description:  After discussing risks, benefits, and alternatives, the patient was placed in a seated position and the right nasal passage was sprayed with topical anesthetic.  The fiberoptic scope was passed through the right nasal passage to view the pharynx and larynx.  Findings are noted above.  The scope was then removed and he was returned to nursing care in stable condition.

## 2017-12-11 NOTE — ED Notes (Signed)
Hospitalist bedside for admission. 

## 2017-12-11 NOTE — Code Documentation (Signed)
84 yo female coming from home with family. Complains of trouble swallowing that started a couple months ago, but had an acute onset of trouble swallowing today at 10:30. Code Stroke called and Stroke Team met patient in CT. CTA and CTP completed. NIHSS 2 with dysarthria and reported incorrect age. 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 No tPA at this time. Pt to be scheduled for Speech evaluation and MRI for further work-up.  

## 2017-12-11 NOTE — Consult Note (Signed)
Reason for Consult: Swallow difficulty Referring Physician: Hospitalist  Teresa Callahan is an 84 y.o. female.  HPI: 84 year old female felt cough and thick phlegm in her throat yesterday.  This morning, she had a period where she was unable to swallow even her saliva.  She was brought to the ER where she was evaluated for possible stroke.  This was ruled out and she was admitted.  Consultation requested to evaluate pharynx.  Past Medical History:  Diagnosis Date  . Anemia   . Asthma   . Atrophic vaginitis   . Back pain   . Carotid stenosis   . Diverticulosis   . GERD (gastroesophageal reflux disease)   . History of blood transfusion 2014   "not related to a surgery"  . Hypothyroidism   . Incontinence   . Osteoporosis   . Pneumonia    "probably" (05/09/2014)  . Postmenopausal   . Thyroid disease   . UTI (lower urinary tract infection)   . Vertigo     Past Surgical History:  Procedure Laterality Date  . ABDOMINAL HYSTERECTOMY  1957  . CATARACT EXTRACTION W/ INTRAOCULAR LENS  IMPLANT, BILATERAL Bilateral   . COLONOSCOPY  09/30/2001  . EYE SURGERY Left    d/t complications from cataract surgery  . HIP PINNING,CANNULATED Left 11/01/2017  . HIP PINNING,CANNULATED Left 11/01/2017   Procedure: CANNULATED HIP PINNING;  Surgeon: Shona Needles, MD;  Location: [place];  Service: Orthopedics;  Laterality: Left;  . TONSILLECTOMY      Family History  Problem Relation Age of Onset  . Colon cancer Mother   . Melanoma Father   . Lymphoma Son     Social History:  reports that she quit smoking about 69 years ago. Her smoking use included cigarettes. She has a 1.60 pack-year smoking history. She has never used smokeless tobacco. She reports that she drinks alcohol. She reports that she does not use drugs.  Allergies:  Allergies  Allergen Reactions  . Horse-Derived Products Other (See Comments)    Loss of appetite  . Macrodantin Other (See Comments)    Unknown reaction  . Premarin  [Estrogens, Conjugated] Other (See Comments)    Loss of appetite  . Sulfa Antibiotics Other (See Comments)    Loss of appetite     Medications: I have reviewed the patient's current medications.  Results for orders placed or performed during the hospital encounter of 12/11/17 (from the past 48 hour(s))  Protime-INR     Status: None   Collection Time: 12/11/17  1:44 PM  Result Value Ref Range   Prothrombin Time 12.6 11.4 - 15.2 seconds   INR 0.95     Comment: Performed at [facility], [place] [address] [place], [place] [address]  APTT     Status: None   Collection Time: 12/11/17  1:44 PM  Result Value Ref Range   aPTT 32 24 - 36 seconds    Comment: Performed at [facility] [address], [place],  [address]  CBC     Status: Abnormal   Collection Time: 12/11/17  1:44 PM  Result Value Ref Range   WBC 9.8 4.0 - 10.5 K/uL   RBC 3.42 (L) 3.87 - 5.11 MIL/uL   Hemoglobin 10.7 (L) 12.0 - 15.0 g/dL   HCT 32.4 (L) 36.0 - 46.0 %   MCV 94.7 78.0 - 100.0 fL   MCH 31.3 26.0 - 34.0 pg   MCHC 33.0 30.0 - 36.0 g/dL  RDW 15.9 (H) 11.5 - 15.5 %   Platelets 298 150 - 400 K/uL    Comment: Performed at [facility], [place] [address], [place], [place] [address]  Differential     Status: Abnormal   Collection Time: 12/11/17  1:44 PM  Result Value Ref Range   Neutrophils Relative % 84 %   Neutro Abs 8.3 (H) 1.7 - 7.7 K/uL   Lymphocytes Relative 8 %   Lymphs Abs 0.8 0.7 - 4.0 K/uL   Monocytes Relative 7 %   Monocytes Absolute 0.7 0.1 - 1.0 K/uL   Eosinophils Relative 1 %   Eosinophils Absolute 0.1 0.0 - 0.7 K/uL   Basophils Relative 0 %   Basophils Absolute 0.0 0.0 - 0.1 K/uL    Comment: Performed at [facility] [address], [place], [place] [address]  Comprehensive metabolic panel     Status: Abnormal   Collection Time: 12/11/17  1:44 PM  Result Value Ref Range   Sodium 133 (L) 135 - 145 mmol/L   Potassium 4.5 3.5 - 5.1 mmol/L   Chloride 100 (L) 101 -  111 mmol/L   CO2 24 22 - 32 mmol/L   Glucose, Bld 116 (H) 65 - 99 mg/dL   BUN 35 (H) 6 - 20 mg/dL   Creatinine, Ser 0.70 0.44 - 1.00 mg/dL   Calcium 9.4 8.9 - 10.3 mg/dL   Total Protein 6.6 6.5 - 8.1 g/dL   Albumin 4.0 3.5 - 5.0 g/dL   AST 22 15 - 41 U/L   ALT 11 (L) 14 - 54 U/L   Alkaline Phosphatase 82 38 - 126 U/L   Total Bilirubin 0.9 0.3 - 1.2 mg/dL   GFR calc non Af Amer >60 >60 mL/min   GFR calc Af Amer >60 >60 mL/min    Comment: (NOTE) The eGFR has been calculated using the CKD EPI equation. This calculation has not been validated in all clinical situations. eGFR's persistently <60 mL/min signify possible Chronic Kidney Disease.    Anion gap 9 5 - 15    Comment: Performed at [facility] [address], [place], [place] [address]  I-stat troponin, ED     Status: None   Collection Time: 12/11/17  1:48 PM  Result Value Ref Range   Troponin i, poc 0.00 0.00 - 0.08 ng/mL   Comment 3            Comment: Due to the release kinetics of cTnI, a negative result within the first hours of the onset of symptoms does not rule out myocardial infarction with certainty. If myocardial infarction is still suspected, repeat the test at appropriate intervals.   CBG monitoring, ED     Status: Abnormal   Collection Time: 12/11/17  1:50 PM  Result Value Ref Range   Glucose-Capillary 106 (H) 65 - 99 mg/dL  I-Stat Chem 8, ED     Status: Abnormal   Collection Time: 12/11/17  1:50 PM  Result Value Ref Range   Sodium 135 135 - 145 mmol/L   Potassium 4.6 3.5 - 5.1 mmol/L   Chloride 101 101 - 111 mmol/L   BUN 34 (H) 6 - 20 mg/dL   Creatinine, Ser 0.60 0.44 - 1.00 mg/dL   Glucose, Bld 112 (H) 65 - 99 mg/dL   Calcium, Ion 1.21 1.15 - 1.40 mmol/L   TCO2 24 22 - 32 mmol/L   Hemoglobin 11.9 (L) 12.0 - 15.0 g/dL   HCT 35.0 (L)  36.0 - 46.0 %    Ct Angio Head W Or Wo Contrast  Result Date: 12/11/2017 CLINICAL DATA:  84 year old female with difficulty swallowing. Code stroke. Possible  brainstem infarct. Recent weight loss. EXAM: CT ANGIOGRAPHY HEAD AND NECK TECHNIQUE: Multidetector CT imaging of the head and neck was performed using the standard protocol during bolus administration of intravenous contrast. Multiplanar CT image reconstructions and MIPs were obtained to evaluate the vascular anatomy. Carotid stenosis measurements (when applicable) are obtained utilizing NASCET criteria, using the distal internal carotid diameter as the denominator. CONTRAST:  57m ISOVUE-370 IOPAMIDOL (ISOVUE-370) INJECTION 76% COMPARISON:  Head CT without contrast 1402 hours today. Brain MRI 05/10/2014. CT Abdomen and Pelvis 06/29/2013. FINDINGS: CTA NECK Skeleton: Advanced cervical spine degeneration. C3-C4 ankylosis. Upper thoracic spine degeneration. No acute osseous abnormality identified. Upper chest: Dilated thoracic esophagus up to 4.2 centimeters diameter with an air-fluid level of the thoracic inlet (series 5, image 143). The visible esophagus remains dilated to below the level of the carina. The distal esophagus on 06/29/2013 appear normal. Negative upper lungs.  No superior mediastinal lymphadenopathy. Other neck: Negative.  No neck mass or lymphadenopathy. Aortic arch: Calcified aortic atherosclerosis. Three vessel arch configuration. Right carotid system: No brachiocephalic or right CCA origin stenosis despite atherosclerosis. There is additional soft plaque in the right CCA, and bulky calcified plaque at the right carotid bifurcation. The right ICA is chronically occluded, unchanged since the 2015 brain MRI. No reconstituted flow in the neck. Left carotid system: No left CCA origin stenosis despite atherosclerosis. At the left carotid bifurcation there is soft and calcified plaque with proximal left ICA stenosis numerically estimated at 60-65 % with respect to the distal vessel (series 8, image 87). The left ICA remains patent to the skull base without additional stenosis. The left ECA origin is  incidentally occluded at its origin, with reconstituted flow in left ECA branches. Vertebral arteries: No proximal right subclavian artery stenosis despite plaque. Calcified plaque at the right vertebral artery origin with moderate to severe stenosis. The right vertebral then is patent to the skull base without additional stenosis. There is bulky soft plaque in the proximal left subclavian artery resulting in high-grade stenosis numerically estimated at 65-70 % with respect to the distal vessel. The left subclavian remains patent, and the left vertebral artery origin is patent without stenosis despite mild calcified plaque. The left vertebral artery is patent to the skull base without additional stenosis. Intermittent left vertebral tortuosity. CTA HEAD Posterior circulation: The distal vertebral arteries are patent. Both PICA origins are patent. There is mild left V4 segment plaque without stenosis. No right V4 segment stenosis. Patent vertebrobasilar junction. Patent basilar artery without plaque or stenosis. SCA and PCA origins are patent and within normal limits. Both posterior communicating arteries are present. The left PCA branches are within normal limits. There is moderate irregularity and stenosis in the right PCA P2 and P3 segments which appears due to atherosclerosis. Anterior circulation: The proximal right ICA siphon is occluded. The right ICA terminus appears reconstituted from the right posterior communicating artery. The left ICA siphon is patent without stenosis despite calcified plaque. Normal left ophthalmic and posterior communicating artery origins. Both carotid termini are patent. Both MCA and ACA origins are patent. The anterior communicating artery and bilateral ACA branches are within normal limits. The proximal left MCA M1 segment is normal but distally there is moderate to severe irregularity and stenosis (series 11, image 20). The left MCA trifurcation remains patent. There is only mild  irregularity of left MCA branches. The right MCA proximal M1 segment is mildly irregular and stenotic. The distal right M1 appears normal. The right MCA bifurcation is patent. The right MCA branches are within normal limits. Venous sinuses: Patent on the delayed images. Anatomic variants: None. Delayed phase: No abnormal enhancement identified. Stable gray-white matter differentiation throughout the brain. Review of the MIP images confirms the above findings IMPRESSION: 1. Negative for emergent large vessel occlusion. Patent basilar artery without atherosclerosis or stenosis. 2. Positive for atherosclerosis with significant stenosis related to the intracranial circulation: - Left ICA origin moderate to severe stenosis (60-65%). - moderate to severe irregularity and stenosis in the distal Left M1 segment and trifurcation. - moderate to severe Right vertebral artery origin stenosis. - severe proximal Left subclavian artery stenosis due to soft plaque, stenosis (65-70%). 3. Chronic occlusion of the right ICA, stable since 2015, with reconstitution of the right ICA terminus from the right posterior communicating artery. 4. Dilated and obstructed appearing proximal thoracic esophagus. Consider benign versus malignant distal esophageal obstruction. 5. Stable CT appearance of the brain since 1402 hours today. Salient findings were discussed by telephone with Dr. Amie Portland on 12/11/2017 beginning at 1541 hours. Electronically Signed   By: Genevie Ann M.D.   On: 12/11/2017 15:55   Dg Chest 2 View  Result Date: 12/11/2017 CLINICAL DATA:  Dysphagia, short of breath EXAM: CHEST - 2 VIEW COMPARISON:  10/31/2017, 07/13/2012 FINDINGS: No focal pulmonary opacity or significant effusion. Stable mild cardiomegaly with aortic atherosclerosis. No pneumothorax. Gibbus deformity at the thoracolumbar junction with marked compression deformities, uncertain age but new since 2013. Calcified left hilar nodes. IMPRESSION: 1. Mild  cardiomegaly without edema or infiltrate. 2. Kyphotic deformity at the thoracolumbar junction with marked compression deformities. Electronically Signed   By: Donavan Foil M.D.   On: 12/11/2017 15:13   Ct Angio Neck W Or Wo Contrast  Result Date: 12/11/2017 CLINICAL DATA:  84 year old female with difficulty swallowing. Code stroke. Possible brainstem infarct. Recent weight loss. EXAM: CT ANGIOGRAPHY HEAD AND NECK TECHNIQUE: Multidetector CT imaging of the head and neck was performed using the standard protocol during bolus administration of intravenous contrast. Multiplanar CT image reconstructions and MIPs were obtained to evaluate the vascular anatomy. Carotid stenosis measurements (when applicable) are obtained utilizing NASCET criteria, using the distal internal carotid diameter as the denominator. CONTRAST:  14m ISOVUE-370 IOPAMIDOL (ISOVUE-370) INJECTION 76% COMPARISON:  Head CT without contrast 1402 hours today. Brain MRI 05/10/2014. CT Abdomen and Pelvis 06/29/2013. FINDINGS: CTA NECK Skeleton: Advanced cervical spine degeneration. C3-C4 ankylosis. Upper thoracic spine degeneration. No acute osseous abnormality identified. Upper chest: Dilated thoracic esophagus up to 4.2 centimeters diameter with an air-fluid level of the thoracic inlet (series 5, image 143). The visible esophagus remains dilated to below the level of the carina. The distal esophagus on 06/29/2013 appear normal. Negative upper lungs.  No superior mediastinal lymphadenopathy. Other neck: Negative.  No neck mass or lymphadenopathy. Aortic arch: Calcified aortic atherosclerosis. Three vessel arch configuration. Right carotid system: No brachiocephalic or right CCA origin stenosis despite atherosclerosis. There is additional soft plaque in the right CCA, and bulky calcified plaque at the right carotid bifurcation. The right ICA is chronically occluded, unchanged since the 2015 brain MRI. No reconstituted flow in the neck. Left carotid  system: No left CCA origin stenosis despite atherosclerosis. At the left carotid bifurcation there is soft and calcified plaque with proximal left ICA stenosis numerically estimated at 60-65 % with respect to the distal  vessel (series 8, image 87). The left ICA remains patent to the skull base without additional stenosis. The left ECA origin is incidentally occluded at its origin, with reconstituted flow in left ECA branches. Vertebral arteries: No proximal right subclavian artery stenosis despite plaque. Calcified plaque at the right vertebral artery origin with moderate to severe stenosis. The right vertebral then is patent to the skull base without additional stenosis. There is bulky soft plaque in the proximal left subclavian artery resulting in high-grade stenosis numerically estimated at 65-70 % with respect to the distal vessel. The left subclavian remains patent, and the left vertebral artery origin is patent without stenosis despite mild calcified plaque. The left vertebral artery is patent to the skull base without additional stenosis. Intermittent left vertebral tortuosity. CTA HEAD Posterior circulation: The distal vertebral arteries are patent. Both PICA origins are patent. There is mild left V4 segment plaque without stenosis. No right V4 segment stenosis. Patent vertebrobasilar junction. Patent basilar artery without plaque or stenosis. SCA and PCA origins are patent and within normal limits. Both posterior communicating arteries are present. The left PCA branches are within normal limits. There is moderate irregularity and stenosis in the right PCA P2 and P3 segments which appears due to atherosclerosis. Anterior circulation: The proximal right ICA siphon is occluded. The right ICA terminus appears reconstituted from the right posterior communicating artery. The left ICA siphon is patent without stenosis despite calcified plaque. Normal left ophthalmic and posterior communicating artery origins. Both  carotid termini are patent. Both MCA and ACA origins are patent. The anterior communicating artery and bilateral ACA branches are within normal limits. The proximal left MCA M1 segment is normal but distally there is moderate to severe irregularity and stenosis (series 11, image 20). The left MCA trifurcation remains patent. There is only mild irregularity of left MCA branches. The right MCA proximal M1 segment is mildly irregular and stenotic. The distal right M1 appears normal. The right MCA bifurcation is patent. The right MCA branches are within normal limits. Venous sinuses: Patent on the delayed images. Anatomic variants: None. Delayed phase: No abnormal enhancement identified. Stable gray-white matter differentiation throughout the brain. Review of the MIP images confirms the above findings IMPRESSION: 1. Negative for emergent large vessel occlusion. Patent basilar artery without atherosclerosis or stenosis. 2. Positive for atherosclerosis with significant stenosis related to the intracranial circulation: - Left ICA origin moderate to severe stenosis (60-65%). - moderate to severe irregularity and stenosis in the distal Left M1 segment and trifurcation. - moderate to severe Right vertebral artery origin stenosis. - severe proximal Left subclavian artery stenosis due to soft plaque, stenosis (65-70%). 3. Chronic occlusion of the right ICA, stable since 2015, with reconstitution of the right ICA terminus from the right posterior communicating artery. 4. Dilated and obstructed appearing proximal thoracic esophagus. Consider benign versus malignant distal esophageal obstruction. 5. Stable CT appearance of the brain since 1402 hours today. Salient findings were discussed by telephone with Dr. Amie Portland on 12/11/2017 beginning at 1541 hours. Electronically Signed   By: Genevie Ann M.D.   On: 12/11/2017 15:55   Ct C-spine No Charge  Result Date: 12/11/2017 CLINICAL DATA:  Dysphagia. EXAM: CT CERVICAL SPINE WITHOUT  CONTRAST TECHNIQUE: Multidetector CT imaging of the cervical spine was performed without intravenous contrast. Multiplanar CT image reconstructions were also generated. COMPARISON:  10/31/2017 FINDINGS: Alignment: Normal Skull base and vertebrae: No fracture or primary bone lesion. Soft tissues and spinal canal: No canal compromise. The esophagus is markedly  distended and filled with fluid and gas in the region. The differential diagnosis is achalasia versus distal obstruction. Disc levels: Ordinary osteoarthritis at the C1-2 level. Fusion from C2 through C4. C4-5 facet osteoarthritis on the left with 2 mm of anterolisthesis. Bony left foraminal narrowing. C5-6 fusion. C6-7 degenerative spondylosis and facet arthritis without notable stenosis. C7-T1 and T1-2 facet arthritis without notable stenosis. Upper thoracic region shows non-compressive degenerative changes. Upper chest: Lung apices are clear. Other: None IMPRESSION: Dilated fluid and air-filled esophagus without causative lesion in the region of the scan. This could be due to achalasia or distal obstruction. Degenerative changes of the spine as outlined above, seemingly unrelated to the complaint of dysphagia. Electronically Signed   By: Nelson Chimes M.D.   On: 12/11/2017 15:53   Ct Head Code Stroke Wo Contrast  Result Date: 12/11/2017 CLINICAL DATA:  Code stroke. 84 year old female with difficulty swallowing. EXAM: CT HEAD WITHOUT CONTRAST TECHNIQUE: Contiguous axial images were obtained from the base of the skull through the vertex without intravenous contrast. COMPARISON:  Head CT without contrast 10/31/2017. Brain MRI 05/10/2014 FINDINGS: Brain: Stable cerebral volume. Posterior fossa gray-white matter differentiation appears stable since February. Stable gray-white matter differentiation elsewhere with mild for age patchy cerebral white matter hypodensity. No midline shift, ventriculomegaly, mass effect, evidence of mass lesion, intracranial  hemorrhage or evidence of cortically based acute infarction. No cortical encephalomalacia identified. Vascular: Calcified atherosclerosis at the skull base. No suspicious intracranial vascular hyperdensity. Skull: Stable.  No acute osseous abnormality identified. Sinuses/Orbits: Visualized paranasal sinuses and mastoids are stable and well pneumatized. Other: No acute orbit or scalp soft tissue findings. ASPECTS [facility] Stroke Program Early CT Score) - Ganglionic level infarction (caudate, lentiform nuclei, internal capsule, insula, M1-M3 cortex): 7 - Supraganglionic infarction (M4-M6 cortex): 3 Total score (0-10 with 10 being normal): 10 IMPRESSION: 1. No acute intracranial abnormality and stable non contrast CT appearance of the brain since February. 2. ASPECTS is 10. 3. These results were communicated to Dr. Rory Percy at 2:44 pmon 3/22/2019by text page via the [facility] messaging system. Electronically Signed   By: Genevie Ann M.D.   On: 12/11/2017 14:44    Review of Systems  All other systems reviewed and are negative.  Blood pressure (!) 114/41, pulse 81, temperature 98.2 F (36.8 C), temperature source Oral, resp. rate 16, height 5' (1.524 m), weight 99 lb 13.9 oz (45.3 kg), SpO2 99 %. Physical Exam  Constitutional: She is oriented to person, place, and time. She appears well-developed and well-nourished. No distress.  HENT:  Head: Normocephalic and atraumatic.  Right Ear: External ear normal.  Left Ear: External ear normal.  Nose: Nose normal.  Mouth/Throat: Oropharynx is clear and moist.  Marked mandibular and palatal tori.  Speech mildly muffled by tori.  Eyes: Pupils are equal, round, and reactive to light. Conjunctivae and EOM are normal.  Neck: Normal range of motion. Neck supple.  Cardiovascular: Normal rate.  Respiratory: Effort normal.  Musculoskeletal: Normal range of motion.  Neurological: She is alert and oriented to person, place, and time. No cranial nerve deficit.  Skin: Skin is warm  and dry.  Psychiatric: She has a normal mood and affect. Her behavior is normal. Judgment and thought content normal.    Assessment/Plan: Dysphagia  The oral cavity findings are consistent with mandibular and palatal tori and are long-standing bony changes that have no impact on her reason for admission.  I performed fiberoptic laryngoscopy at the bedside.  See operative note.  Findings are  normal.   No anatomical problem to explain her symptom.  Proceed with further workup, as indicated.  Estes Lehner 12/11/2017, 8:37 PM

## 2017-12-11 NOTE — ED Notes (Signed)
Pt presents from PCP office for evaluation of dysphagia and sob. Pt reports she woke up this AM feeling ok and was able to eat breakfast. Around 10:30 she started having difficulty managing saliva/secretions. At this point patient is unable to swallow any secretions.

## 2017-12-11 NOTE — ED Notes (Signed)
Chestine SporeClark (son) cell: [phone] Efraim KaufmannMelissa (daughter) cell: [phone] Please call with any concerns

## 2017-12-11 NOTE — ED Provider Notes (Signed)
Patient placed in Quick Look pathway, seen and evaluated   Chief Complaint: Unable to swallow  HPI:   84 year old female presents with a drooling, muffled voice and inability to swallow.  She had sudden onset of the symptoms at about 10 AM.  The history is given by the patient and her daughter who is present at bedside.  The patient denies sore throat.  She denies feeling of food impaction.  She has not been gagging but she has massive amounts of phlegm.  She does have a bit of a runny nose.  Her daughter states that her voice does not usually sound like this in for chief both feels that she cannot swallow and that she is having difficulty breathing.    ROS: Positive for dysphasia (one)  Physical Exam:   Gen: Elderly female sitting in her wheelchair.  She is unable to keep any spit in her mouth and is constantly draining into an emesis bag.  Neuro: Awake and Alert  Skin: Warm    Focused Exam: Oropharynx reveals market torus palatine and hypertrophic changes of the inferior bony tissues as well.  No evidence of oral pharyngeal swelling.  Patient will be moved back emergently as she may need definitive airway based on her CODE STATUS.  I spoke with Dr. Erma HeritageIsaacs to make him aware of the case directly.  Charge nurse informed of emergent nature of patient's case.    Initiation of care has begun. The patient has been counseled on the process, plan, and necessity for staying for the completion/evaluation, and the remainder of the medical screening examination     Arthor CaptainHarris, [place], PA-C 12/11/17 1317    Shaune PollackIsaacs, [place], MD 12/12/17 1056

## 2017-12-11 NOTE — ED Provider Notes (Signed)
[facility] Provider Note   CSN: [id number] Arrival date & time: 12/11/17  1253     History   Chief Complaint Chief Complaint  Patient presents with  . Shortness of Breath  . Dysphagia    HPI Teresa Callahan is a 84 y.o. female.  HPI   84 yo F with PMHx of recent hip fx here with dysphagia.  History provided with assistance of family.  Patient reportedly has been increasingly more weak for the last several weeks.  She is having progressive more difficulty eating and moving around the house.  She is recovering from a hip fracture but seems to be progressively getting worse.  Earlier today, the patient at around 10:00 began to develop difficulty swallowing.  She has had difficulty swallowing intermittently in the past, but this is acutely worsened.  She denies any associated symptoms.  No headache.  No visual changes.  No double vision.  No focal numbness or weakness.  No abdominal pain.  No chest pain.   On my assessment, history limited 2/2 difficulty speaking.  Level 5 caveat invoked as remainder of history, ROS, and physical exam limited due to patient's mental status.   Past Medical History:  Diagnosis Date  . Anemia   . Asthma   . Atrophic vaginitis   . Back pain   . Carotid stenosis   . Diverticulosis   . GERD (gastroesophageal reflux disease)   . History of blood transfusion 2014   "not related to a surgery"  . Hypothyroidism   . Incontinence   . Osteoporosis   . Pneumonia    "probably" (05/09/2014)  . Postmenopausal   . Thyroid disease   . UTI (lower urinary tract infection)   . Vertigo     Patient Active Problem List   Diagnosis Date Noted  . Dysphagia 12/11/2017  . Closed left hip fracture (HCC) 10/31/2017  . Hyponatremia 05/12/2014  . Expressive aphasia 05/10/2014  . UTI (urinary tract infection) 05/09/2014  . Altered mental status 05/09/2014  . Carotid stenosis 05/09/2014  . Acute renal failure (HCC) 05/09/2014  . Acute  delirium 05/09/2014  . Hypothyroidism 10/05/2013  . Asthma, chronic 02/17/2013  . Postural dizziness 02/17/2013  . Pulmonary infiltrates 11/24/2011  . Chronic UTI 11/24/2011  . ANGIODYSPLASIA-INTESTINE 06/25/2010  . ANEMIA, IRON DEFICIENCY 05/07/2010  . FECAL OCCULT BLOOD 05/07/2010    Past Surgical History:  Procedure Laterality Date  . ABDOMINAL HYSTERECTOMY  1957  . CATARACT EXTRACTION W/ INTRAOCULAR LENS  IMPLANT, BILATERAL Bilateral   . COLONOSCOPY  09/30/2001  . EYE SURGERY Left    d/t complications from cataract surgery  . HIP PINNING,CANNULATED Left 11/01/2017  . HIP PINNING,CANNULATED Left 11/01/2017   Procedure: CANNULATED HIP PINNING;  Surgeon: Roby Lofts, MD;  Location: [facility] OR;  Service: Orthopedics;  Laterality: Left;  . TONSILLECTOMY      OB History    Gravida  4   Para  4   Term      Preterm      AB      Living  5     SAB      TAB      Ectopic      Multiple      Live Births               Home Medications    Prior to Admission medications   Medication Sig Start Date End Date Taking? Authorizing Provider  acetaminophen (TYLENOL) 500 MG tablet  Take 500-1,000 mg by mouth every 6 (six) hours as needed (pain).    [provider]  albuterol (PROVENTIL HFA;VENTOLIN HFA) 108 (90 BASE) MCG/ACT inhaler Inhale 1 puff into the lungs 2 (two) times daily as needed for wheezing or shortness of breath.    [provider]  doxycycline (ADOXA) 100 MG tablet Take 100 mg by mouth daily. Continuous for UTI prevention    [provider]  enoxaparin (LOVENOX) 30 MG/0.3ML injection Inject 0.3 mLs (30 mg total) into the skin daily. 11/03/17 12/11/17  Haddix, Gillie MannersKevin P, MD  estradiol (ESTRACE) 0.1 MG/GM vaginal cream Place 1 Applicatorful vaginally at bedtime as needed (dryness).    [provider]  levothyroxine (SYNTHROID, LEVOTHROID) 75 MCG tablet Take 75 mcg by mouth daily.    [provider]  loperamide (IMODIUM) 2 MG  capsule Take 2 mg by mouth as needed for diarrhea or loose stools.    [provider]  OVER THE COUNTER MEDICATION Take 1 tablet by mouth daily. Vitamin B12 or D3?    [provider]  traMADol (ULTRAM) 50 MG tablet Take 1 tablet (50 mg total) by mouth every 6 (six) hours as needed for moderate pain. 11/03/17   Haddix, Gillie MannersKevin P, MD    Family History Family History  Problem Relation Age of Onset  . Colon cancer Mother   . Melanoma Father   . Lymphoma Son     Social History Social History   Tobacco Use  . Smoking status: Former Smoker    Packs/day: 0.20    Years: 8.00    Pack years: 1.60    Types: Cigarettes    Last attempt to quit: 09/22/1948    Years since quitting: 69.2  . Smokeless tobacco: Never Used  Substance Use Topics  . Alcohol use: Yes    Comment: 1 drink daily  . Drug use: No     Allergies   Horse-derived products; Macrodantin; Premarin [estrogens, conjugated]; and Sulfa antibiotics   Review of Systems Review of Systems  Constitutional: Positive for fatigue. Negative for chills and fever.  HENT: Positive for trouble swallowing. Negative for congestion, rhinorrhea and sore throat.   Eyes: Negative for visual disturbance.  Respiratory: Positive for cough. Negative for shortness of breath and wheezing.   Cardiovascular: Negative for chest pain and leg swelling.  Gastrointestinal: Negative for abdominal pain, diarrhea, nausea and vomiting.  Genitourinary: Negative for dysuria, flank pain, vaginal bleeding and vaginal discharge.  Musculoskeletal: Negative for neck pain.  Skin: Negative for rash.  Allergic/Immunologic: Negative for immunocompromised state.  Neurological: Negative for syncope and headaches.  Hematological: Does not bruise/bleed easily.  All other systems reviewed and are negative.    Physical Exam Updated Vital Signs BP (!) 114/41 (BP Location: Left Arm)   Pulse 81   Temp 98.2 F (36.8 C) (Oral)   Resp 16   Ht 5' (1.524 m)    Wt 45.3 kg (99 lb 13.9 oz)   SpO2 99%   BMI 19.50 kg/m   Physical Exam  Constitutional: She is oriented to person, place, and time. She appears well-developed and well-nourished. No distress.  HENT:  Head: Normocephalic and atraumatic.  Pooling of secretions in oropharynx. No blood. Poor dentition.  Eyes: Conjunctivae are normal.  Neck: Neck supple.  No appreciable neck mass.  No bruit.  Trachea is midline.  Cardiovascular: Normal rate, regular rhythm and normal heart sounds. Exam reveals no friction rub.  No murmur heard. Pulmonary/Chest: Effort normal and breath sounds normal.  No respiratory distress. She has no wheezes. She has no rales.  Abdominal: She exhibits no distension.  Musculoskeletal: She exhibits no edema.  Neurological: She is alert and oriented to person, place, and time. She exhibits normal muscle tone.  Skin: Skin is warm. Capillary refill takes less than 2 seconds.  Psychiatric: She has a normal mood and affect.  Nursing note and vitals reviewed.   Neurological Exam:  Mental Status: Alert.  Mild slurred speech and dysarthria.  Cranial Nerves: Visual fields grossly intact. EOMI and PERRLA. No nystagmus noted. Facial sensation intact at forehead, maxillary cheek, and chin/mandible bilaterally. No facial asymmetry or weakness. Hearing grossly normal. Uvula is midline, and palate elevates symmetrically but there is pooling of secretions and difficulty swallowing. Normal SCM and trapezius strength. Tongue midline without fasciculations. Motor: Muscle strength 5/5 in proximal and distal UE and LE bilaterally. No pronator drift. Muscle tone normal. Reflexes: 2+ and symmetrical in all four extremities.  Sensation: Intact to light touch in upper and lower extremities distally bilaterally.  Coordination: Normal FTN bilaterally.   ED Treatments / Results  Labs (all labs ordered are listed, but only abnormal results are displayed) Labs Reviewed  CBC - Abnormal; Notable  for the following components:      Result Value   RBC 3.42 (*)    Hemoglobin 10.7 (*)    HCT 32.4 (*)    RDW 15.9 (*)    All other components within normal limits  DIFFERENTIAL - Abnormal; Notable for the following components:   Neutro Abs 8.3 (*)    All other components within normal limits  COMPREHENSIVE METABOLIC PANEL - Abnormal; Notable for the following components:   Sodium 133 (*)    Chloride 100 (*)    Glucose, Bld 116 (*)    BUN 35 (*)    ALT 11 (*)    All other components within normal limits  CBG MONITORING, ED - Abnormal; Notable for the following components:   Glucose-Capillary 106 (*)    All other components within normal limits  I-STAT CHEM 8, ED - Abnormal; Notable for the following components:   BUN 34 (*)    Glucose, Bld 112 (*)    Hemoglobin 11.9 (*)    HCT 35.0 (*)    All other components within normal limits  PROTIME-INR  APTT  URINALYSIS, ROUTINE W REFLEX MICROSCOPIC  ACETYLCHOLINE RECEPTOR AB, ALL  BASIC METABOLIC PANEL  CBC  I-STAT TROPONIN, ED    EKG  EKG Interpretation None       Radiology Ct Angio Head W Or Wo Contrast  Result Date: 12/11/2017 CLINICAL DATA:  84 year old female with difficulty swallowing. Code stroke. Possible brainstem infarct. Recent weight loss. EXAM: CT ANGIOGRAPHY HEAD AND NECK TECHNIQUE: Multidetector CT imaging of the head and neck was performed using the standard protocol during bolus administration of intravenous contrast. Multiplanar CT image reconstructions and MIPs were obtained to evaluate the vascular anatomy. Carotid stenosis measurements (when applicable) are obtained utilizing NASCET criteria, using the distal internal carotid diameter as the denominator. CONTRAST:  50mL ISOVUE-370 IOPAMIDOL (ISOVUE-370) INJECTION 76% COMPARISON:  Head CT without contrast 1402 hours today. Brain MRI 05/10/2014. CT Abdomen and Pelvis 06/29/2013. FINDINGS: CTA NECK Skeleton: Advanced cervical spine degeneration. C3-C4 ankylosis.  Upper thoracic spine degeneration. No acute osseous abnormality identified. Upper chest: Dilated thoracic esophagus up to 4.2 centimeters diameter with an air-fluid level of the thoracic inlet (series 5, image 143). The visible esophagus remains dilated to below the level of the carina.  The distal esophagus on 06/29/2013 appear normal. Negative upper lungs.  No superior mediastinal lymphadenopathy. Other neck: Negative.  No neck mass or lymphadenopathy. Aortic arch: Calcified aortic atherosclerosis. Three vessel arch configuration. Right carotid system: No brachiocephalic or right CCA origin stenosis despite atherosclerosis. There is additional soft plaque in the right CCA, and bulky calcified plaque at the right carotid bifurcation. The right ICA is chronically occluded, unchanged since the 2015 brain MRI. No reconstituted flow in the neck. Left carotid system: No left CCA origin stenosis despite atherosclerosis. At the left carotid bifurcation there is soft and calcified plaque with proximal left ICA stenosis numerically estimated at 60-65 % with respect to the distal vessel (series 8, image 87). The left ICA remains patent to the skull base without additional stenosis. The left ECA origin is incidentally occluded at its origin, with reconstituted flow in left ECA branches. Vertebral arteries: No proximal right subclavian artery stenosis despite plaque. Calcified plaque at the right vertebral artery origin with moderate to severe stenosis. The right vertebral then is patent to the skull base without additional stenosis. There is bulky soft plaque in the proximal left subclavian artery resulting in high-grade stenosis numerically estimated at 65-70 % with respect to the distal vessel. The left subclavian remains patent, and the left vertebral artery origin is patent without stenosis despite mild calcified plaque. The left vertebral artery is patent to the skull base without additional stenosis. Intermittent left  vertebral tortuosity. CTA HEAD Posterior circulation: The distal vertebral arteries are patent. Both PICA origins are patent. There is mild left V4 segment plaque without stenosis. No right V4 segment stenosis. Patent vertebrobasilar junction. Patent basilar artery without plaque or stenosis. SCA and PCA origins are patent and within normal limits. Both posterior communicating arteries are present. The left PCA branches are within normal limits. There is moderate irregularity and stenosis in the right PCA P2 and P3 segments which appears due to atherosclerosis. Anterior circulation: The proximal right ICA siphon is occluded. The right ICA terminus appears reconstituted from the right posterior communicating artery. The left ICA siphon is patent without stenosis despite calcified plaque. Normal left ophthalmic and posterior communicating artery origins. Both carotid termini are patent. Both MCA and ACA origins are patent. The anterior communicating artery and bilateral ACA branches are within normal limits. The proximal left MCA M1 segment is normal but distally there is moderate to severe irregularity and stenosis (series 11, image 20). The left MCA trifurcation remains patent. There is only mild irregularity of left MCA branches. The right MCA proximal M1 segment is mildly irregular and stenotic. The distal right M1 appears normal. The right MCA bifurcation is patent. The right MCA branches are within normal limits. Venous sinuses: Patent on the delayed images. Anatomic variants: None. Delayed phase: No abnormal enhancement identified. Stable gray-white matter differentiation throughout the brain. Review of the MIP images confirms the above findings IMPRESSION: 1. Negative for emergent large vessel occlusion. Patent basilar artery without atherosclerosis or stenosis. 2. Positive for atherosclerosis with significant stenosis related to the intracranial circulation: - Left ICA origin moderate to severe stenosis  (60-65%). - moderate to severe irregularity and stenosis in the distal Left M1 segment and trifurcation. - moderate to severe Right vertebral artery origin stenosis. - severe proximal Left subclavian artery stenosis due to soft plaque, stenosis (65-70%). 3. Chronic occlusion of the right ICA, stable since 2015, with reconstitution of the right ICA terminus from the right posterior communicating artery. 4. Dilated and obstructed appearing proximal thoracic  esophagus. Consider benign versus malignant distal esophageal obstruction. 5. Stable CT appearance of the brain since 1402 hours today. Salient findings were discussed by telephone with Dr. Milon Dikes on 12/11/2017 beginning at 1541 hours. Electronically Signed   By: Odessa Fleming M.D.   On: 12/11/2017 15:55   Dg Chest 2 View  Result Date: 12/11/2017 CLINICAL DATA:  Dysphagia, short of breath EXAM: CHEST - 2 VIEW COMPARISON:  10/31/2017, 07/13/2012 FINDINGS: No focal pulmonary opacity or significant effusion. Stable mild cardiomegaly with aortic atherosclerosis. No pneumothorax. Gibbus deformity at the thoracolumbar junction with marked compression deformities, uncertain age but new since 2013. Calcified left hilar nodes. IMPRESSION: 1. Mild cardiomegaly without edema or infiltrate. 2. Kyphotic deformity at the thoracolumbar junction with marked compression deformities. Electronically Signed   By: Jasmine Pang M.D.   On: 12/11/2017 15:13   Ct Angio Neck W Or Wo Contrast  Result Date: 12/11/2017 CLINICAL DATA:  84 year old female with difficulty swallowing. Code stroke. Possible brainstem infarct. Recent weight loss. EXAM: CT ANGIOGRAPHY HEAD AND NECK TECHNIQUE: Multidetector CT imaging of the head and neck was performed using the standard protocol during bolus administration of intravenous contrast. Multiplanar CT image reconstructions and MIPs were obtained to evaluate the vascular anatomy. Carotid stenosis measurements (when applicable) are obtained utilizing  NASCET criteria, using the distal internal carotid diameter as the denominator. CONTRAST:  50mL ISOVUE-370 IOPAMIDOL (ISOVUE-370) INJECTION 76% COMPARISON:  Head CT without contrast 1402 hours today. Brain MRI 05/10/2014. CT Abdomen and Pelvis 06/29/2013. FINDINGS: CTA NECK Skeleton: Advanced cervical spine degeneration. C3-C4 ankylosis. Upper thoracic spine degeneration. No acute osseous abnormality identified. Upper chest: Dilated thoracic esophagus up to 4.2 centimeters diameter with an air-fluid level of the thoracic inlet (series 5, image 143). The visible esophagus remains dilated to below the level of the carina. The distal esophagus on 06/29/2013 appear normal. Negative upper lungs.  No superior mediastinal lymphadenopathy. Other neck: Negative.  No neck mass or lymphadenopathy. Aortic arch: Calcified aortic atherosclerosis. Three vessel arch configuration. Right carotid system: No brachiocephalic or right CCA origin stenosis despite atherosclerosis. There is additional soft plaque in the right CCA, and bulky calcified plaque at the right carotid bifurcation. The right ICA is chronically occluded, unchanged since the 2015 brain MRI. No reconstituted flow in the neck. Left carotid system: No left CCA origin stenosis despite atherosclerosis. At the left carotid bifurcation there is soft and calcified plaque with proximal left ICA stenosis numerically estimated at 60-65 % with respect to the distal vessel (series 8, image 87). The left ICA remains patent to the skull base without additional stenosis. The left ECA origin is incidentally occluded at its origin, with reconstituted flow in left ECA branches. Vertebral arteries: No proximal right subclavian artery stenosis despite plaque. Calcified plaque at the right vertebral artery origin with moderate to severe stenosis. The right vertebral then is patent to the skull base without additional stenosis. There is bulky soft plaque in the proximal left subclavian  artery resulting in high-grade stenosis numerically estimated at 65-70 % with respect to the distal vessel. The left subclavian remains patent, and the left vertebral artery origin is patent without stenosis despite mild calcified plaque. The left vertebral artery is patent to the skull base without additional stenosis. Intermittent left vertebral tortuosity. CTA HEAD Posterior circulation: The distal vertebral arteries are patent. Both PICA origins are patent. There is mild left V4 segment plaque without stenosis. No right V4 segment stenosis. Patent vertebrobasilar junction. Patent basilar artery without plaque or stenosis.  SCA and PCA origins are patent and within normal limits. Both posterior communicating arteries are present. The left PCA branches are within normal limits. There is moderate irregularity and stenosis in the right PCA P2 and P3 segments which appears due to atherosclerosis. Anterior circulation: The proximal right ICA siphon is occluded. The right ICA terminus appears reconstituted from the right posterior communicating artery. The left ICA siphon is patent without stenosis despite calcified plaque. Normal left ophthalmic and posterior communicating artery origins. Both carotid termini are patent. Both MCA and ACA origins are patent. The anterior communicating artery and bilateral ACA branches are within normal limits. The proximal left MCA M1 segment is normal but distally there is moderate to severe irregularity and stenosis (series 11, image 20). The left MCA trifurcation remains patent. There is only mild irregularity of left MCA branches. The right MCA proximal M1 segment is mildly irregular and stenotic. The distal right M1 appears normal. The right MCA bifurcation is patent. The right MCA branches are within normal limits. Venous sinuses: Patent on the delayed images. Anatomic variants: None. Delayed phase: No abnormal enhancement identified. Stable gray-white matter differentiation  throughout the brain. Review of the MIP images confirms the above findings IMPRESSION: 1. Negative for emergent large vessel occlusion. Patent basilar artery without atherosclerosis or stenosis. 2. Positive for atherosclerosis with significant stenosis related to the intracranial circulation: - Left ICA origin moderate to severe stenosis (60-65%). - moderate to severe irregularity and stenosis in the distal Left M1 segment and trifurcation. - moderate to severe Right vertebral artery origin stenosis. - severe proximal Left subclavian artery stenosis due to soft plaque, stenosis (65-70%). 3. Chronic occlusion of the right ICA, stable since 2015, with reconstitution of the right ICA terminus from the right posterior communicating artery. 4. Dilated and obstructed appearing proximal thoracic esophagus. Consider benign versus malignant distal esophageal obstruction. 5. Stable CT appearance of the brain since 1402 hours today. Salient findings were discussed by telephone with Dr. Milon Dikes on 12/11/2017 beginning at 1541 hours. Electronically Signed   By: Odessa Fleming M.D.   On: 12/11/2017 15:55   Ct C-spine No Charge  Result Date: 12/11/2017 CLINICAL DATA:  Dysphagia. EXAM: CT CERVICAL SPINE WITHOUT CONTRAST TECHNIQUE: Multidetector CT imaging of the cervical spine was performed without intravenous contrast. Multiplanar CT image reconstructions were also generated. COMPARISON:  10/31/2017 FINDINGS: Alignment: Normal Skull base and vertebrae: No fracture or primary bone lesion. Soft tissues and spinal canal: No canal compromise. The esophagus is markedly distended and filled with fluid and gas in the region. The differential diagnosis is achalasia versus distal obstruction. Disc levels: Ordinary osteoarthritis at the C1-2 level. Fusion from C2 through C4. C4-5 facet osteoarthritis on the left with 2 mm of anterolisthesis. Bony left foraminal narrowing. C5-6 fusion. C6-7 degenerative spondylosis and facet arthritis  without notable stenosis. C7-T1 and T1-2 facet arthritis without notable stenosis. Upper thoracic region shows non-compressive degenerative changes. Upper chest: Lung apices are clear. Other: None IMPRESSION: Dilated fluid and air-filled esophagus without causative lesion in the region of the scan. This could be due to achalasia or distal obstruction. Degenerative changes of the spine as outlined above, seemingly unrelated to the complaint of dysphagia. Electronically Signed   By: Paulina Fusi M.D.   On: 12/11/2017 15:53   Ct Head Code Stroke Wo Contrast  Result Date: 12/11/2017 CLINICAL DATA:  Code stroke. 84 year old female with difficulty swallowing. EXAM: CT HEAD WITHOUT CONTRAST TECHNIQUE: Contiguous axial images were obtained from the base of the  skull through the vertex without intravenous contrast. COMPARISON:  Head CT without contrast 10/31/2017. Brain MRI 05/10/2014 FINDINGS: Brain: Stable cerebral volume. Posterior fossa gray-white matter differentiation appears stable since February. Stable gray-white matter differentiation elsewhere with mild for age patchy cerebral white matter hypodensity. No midline shift, ventriculomegaly, mass effect, evidence of mass lesion, intracranial hemorrhage or evidence of cortically based acute infarction. No cortical encephalomalacia identified. Vascular: Calcified atherosclerosis at the skull base. No suspicious intracranial vascular hyperdensity. Skull: Stable.  No acute osseous abnormality identified. Sinuses/Orbits: Visualized paranasal sinuses and mastoids are stable and well pneumatized. Other: No acute orbit or scalp soft tissue findings. ASPECTS [facility] Stroke Program Early CT Score) - Ganglionic level infarction (caudate, lentiform nuclei, internal capsule, insula, M1-M3 cortex): 7 - Supraganglionic infarction (M4-M6 cortex): 3 Total score (0-10 with 10 being normal): 10 IMPRESSION: 1. No acute intracranial abnormality and stable non contrast CT appearance  of the brain since February. 2. ASPECTS is 10. 3. These results were communicated to Dr. Wilford Corner at 2:44 pmon 3/22/2019by text page via the [facility] messaging system. Electronically Signed   By: Odessa Fleming M.D.   On: 12/11/2017 14:44    Procedures .Critical Care Performed by: Shaune Pollack, MD Authorized by: Shaune Pollack, MD   Critical care provider statement:    Critical care time (minutes):  35   Critical care time was exclusive of:  Separately billable procedures and treating other patients and teaching time   Critical care was necessary to treat or prevent imminent or life-threatening deterioration of the following conditions:  CNS failure or compromise and respiratory failure   Critical care was time spent personally by me on the following activities:  Development of treatment plan with patient or surrogate, discussions with consultants, evaluation of patient's response to treatment, examination of patient, obtaining history from patient or surrogate, ordering and performing treatments and interventions, ordering and review of laboratory studies, ordering and review of radiographic studies, pulse oximetry, re-evaluation of patient's condition and review of old charts   I assumed direction of critical care for this patient from another provider in my specialty: no     (including critical care time)  Medications Ordered in ED Medications  enoxaparin (LOVENOX) injection 30 mg (30 mg Subcutaneous Given 12/11/17 1834)  ondansetron (ZOFRAN) tablet 4 mg (has no administration in time range)    Or  ondansetron (ZOFRAN) injection 4 mg (has no administration in time range)  iopamidol (ISOVUE-370) 76 % injection 50 mL (50 mLs Intravenous Contrast Given 12/11/17 1502)  0.9 %  sodium chloride infusion ( Intravenous Transfusing/Transfer 12/11/17 1648)  metoCLOPramide (REGLAN) injection 5 mg (5 mg Intravenous Given 12/11/17 1834)     Initial Impression / Assessment and Plan / ED Course  I have reviewed  the triage vital signs and the nursing notes.  Pertinent labs & imaging results that were available during my care of the patient were reviewed by me and considered in my medical decision making (see chart for details).     84 yo F here with difficulty swallowing, weakness. Concern for possible CVA, though history and exam is also highly c/f myasthenia, possibly reactivation of old stroke. Will check CT, activate code stroke. NOTE: Initial code stroke delayed 2/2 complex and atypical presentation, difficulty delineating timeline.   CT Neg. Pt continues to have swallowing difficulty but has normal WOB, NIF -30, protecting airway. Neuro has seen - suspect myasthenia. DDx also includes esophageal stricture, pharyngeal abnormality. Pt will be admitted to step down with Hospitalist service  with Neurology consulting. Family updated and in agreement. Lab work is otherwise reassuring. CBC wnl. Electrolytes acceptable. No apparent infectious etiology/trigger. UA is pending.  Final Clinical Impressions(s) / ED Diagnoses   Final diagnoses:  Dysphagia, unspecified type  Stroke-like episode [facility])    ED Discharge Orders    None       Shaune Pollack, MD 12/11/17 2245

## 2017-12-11 NOTE — Consult Note (Signed)
Neurology Consultation  Reason for Consult: Code stroke Referring Physician: Dr. Clayborne Dana  CC: Dysphagia-acute worsening  History is obtained from: Patient, chart, son, daughter  HPI: Teresa Callahan is a 84 y.o. female who has a past medical history of hypothyroidism, recent hip surgery, who was in her usual state of health until about 11:30 AM today when the family noted that she has been having a lot of difficulty swallowing. According to the patient's family, they have noticed that she has been having difficulty with swallowing, mainly pills but also noted that she had a lot of saliva pooling in her mouth when she was admitted for the hip surgery recently in the hospital. This morning, her swelling had become acutely worse which prompted the visit to the hospital and pacing of the acute code stroke she was also noted to have slightly disconjugate gaze. Upon my examination of the patient, she had a nonfocal exam with the exception of dysarthria -see below. She has some baseline disconjugate gaze, which history was provided later on. There was initial concern for posterior circulation stroke for which a code stroke was activated. Upon further history taking and evaluation, the symptoms seem to have been ongoing for a while and are not acute.  Family reports that every day around midday she becomes very tired and sleeps on the couch or chair.  Her neck is also weak when she is sleeping.  She is also lost a lot of weight in the past few months.  LKW: Acute change at 11:30 a.m., but history is more protracted tpa given?: no, not likely stroke Premorbid modified Rankin scale (mRS): 3  ROS: ROS was performed and is negative except as noted in the HPI.   Past Medical History:  Diagnosis Date  . Anemia   . Asthma   . Atrophic vaginitis   . Back pain   . Carotid stenosis   . Diverticulosis   . GERD (gastroesophageal reflux disease)   . History of blood transfusion 2014   "not related to a  surgery"  . Hypothyroidism   . Incontinence   . Osteoporosis   . Pneumonia    "probably" (05/09/2014)  . Postmenopausal   . Thyroid disease   . UTI (lower urinary tract infection)   . Vertigo     Family History  Problem Relation Age of Onset  . Colon cancer Mother   . Melanoma Father   . Lymphoma Son    Social History:   reports that she quit smoking about 69 years ago. Her smoking use included cigarettes. She has a 1.60 pack-year smoking history. She has never used smokeless tobacco. She reports that she drinks alcohol. She reports that she does not use drugs.  Medications  Current Facility-Administered Medications:  .  iopamidol (ISOVUE-300) 61 % injection, , , ,   Current Outpatient Medications:  .  acetaminophen (TYLENOL) 500 MG tablet, Take 500-1,000 mg by mouth every 6 (six) hours as needed (pain)., Disp: , Rfl:  .  albuterol (PROVENTIL HFA;VENTOLIN HFA) 108 (90 BASE) MCG/ACT inhaler, Inhale 1 puff into the lungs 2 (two) times daily as needed for wheezing or shortness of breath., Disp: , Rfl:  .  doxycycline (ADOXA) 100 MG tablet, Take 100 mg by mouth daily. Continuous for UTI prevention, Disp: , Rfl:  .  enoxaparin (LOVENOX) 30 MG/0.3ML injection, Inject 0.3 mLs (30 mg total) into the skin daily., Disp: 9 mL, Rfl: 0 .  estradiol (ESTRACE) 0.1 MG/GM vaginal cream, Place 1 Applicatorful vaginally  at bedtime as needed (dryness)., Disp: , Rfl:  .  levothyroxine (SYNTHROID, LEVOTHROID) 75 MCG tablet, Take 75 mcg by mouth daily., Disp: , Rfl:  .  loperamide (IMODIUM) 2 MG capsule, Take 2 mg by mouth as needed for diarrhea or loose stools., Disp: , Rfl:  .  OVER THE COUNTER MEDICATION, Take 1 tablet by mouth daily. Vitamin B12 or D3?, Disp: , Rfl:  .  traMADol (ULTRAM) 50 MG tablet, Take 1 tablet (50 mg total) by mouth every 6 (six) hours as needed for moderate pain., Disp: 30 tablet, Rfl: 0  Exam: Current vital signs: BP (!) 123/49   Pulse 69   Temp 98.4 F (36.9 C) (Oral)    Resp 16   Ht 5' (1.524 m)   Wt 54.4 kg (120 lb)   SpO2 99%   BMI 23.44 kg/m  Vital signs in last 24 hours: Temp:  [98.4 F (36.9 C)] 98.4 F (36.9 C) (03/22 1301) Pulse Rate:  [69] 69 (03/22 1301) Resp:  [16] 16 (03/22 1301) BP: (123)/(49) 123/49 (03/22 1301) SpO2:  [99 %] 99 % (03/22 1301) Weight:  [54.4 kg (120 lb)] 54.4 kg (120 lb) (03/22 1301) General: Awake alert in no acute distress HEENT: Normocephalic, atraumatic, excessive saliva in her mouth, no thyromegaly  cardiovascular: S1-S2 heard, regular rate rhythm Lungs: Clear to auscultation bilaterally Abdomen: Nondistended nontender Neurological exam Patient is awake alert oriented x3. Her speech is dysarthric almost guttural sounding. Naming, attention repetition is intact. Cranial nerves: Pupils are equal round reactive to light, right eye is slightly down and I would compared to the left but that is her baseline and she has had that ever since she was a child, mild bilateral ptosis, extraocular movements otherwise are intact with no restriction or diplopia, facial sensation intact bilaterally, face is symmetric bilaterally, auditory acuity is mildly reduced bilaterally, palate does not elevate much bilaterally, shoulder shrug intact, tongue midline. Motor exam: Symmetric 5/5 strength all over with some fatigability.  Neck flexor and extensors are 4+/5. Sensory exam: Intact light touch all over with no extinction Coronation: Intact finger-nose-finger DTRs: Blunted all over NIH stroke scale-1 for dysarthria  Labs I have reviewed labs in epic and the results pertinent to this consultation are:  CBC    Component Value Date/Time   WBC 9.8 12/11/2017 1344   RBC 3.42 (L) 12/11/2017 1344   HGB 11.9 (L) 12/11/2017 1350   HCT 35.0 (L) 12/11/2017 1350   PLT 298 12/11/2017 1344   MCV 94.7 12/11/2017 1344   MCH 31.3 12/11/2017 1344   MCHC 33.0 12/11/2017 1344   RDW 15.9 (H) 12/11/2017 1344   LYMPHSABS 0.8 12/11/2017 1344    MONOABS 0.7 12/11/2017 1344   EOSABS 0.1 12/11/2017 1344   BASOSABS 0.0 12/11/2017 1344   CMP     Component Value Date/Time   NA 135 12/11/2017 1350   K 4.6 12/11/2017 1350   CL 101 12/11/2017 1350   CO2 24 12/11/2017 1344   GLUCOSE 112 (H) 12/11/2017 1350   BUN 34 (H) 12/11/2017 1350   CREATININE 0.60 12/11/2017 1350   CREATININE 0.88 06/28/2013 1349   CALCIUM 9.4 12/11/2017 1344   PROT 6.6 12/11/2017 1344   ALBUMIN 4.0 12/11/2017 1344   AST 22 12/11/2017 1344   ALT 11 (L) 12/11/2017 1344   ALKPHOS 82 12/11/2017 1344   BILITOT 0.9 12/11/2017 1344   GFRNONAA >60 12/11/2017 1344   GFRAA >60 12/11/2017 1344   Imaging I have reviewed the images obtained:  CT-scan of the brain -noncontrast CT of the head did not show any acute changes. CTA H+N: likley chronic (comparison MRI from before showed no flow void then as well). verts patent. Right subclavian calcified plaque, left subclavian soft plaque with good vert origin. PCA athero with no significant stenosis. Incidental fluid build up in esophagus.  Assessment:  84 year old woman who is been having progressive generalized weakness, weight loss and difficulty swallowing with acute worsening of dysphagia brought in as an acute code stroke. On my evaluation, she does not have any other focal weakness but has some fatigability on her generalized muscle exam. She does have severe dysarthria and dysphagia. My suspicion is this might be myasthenia gravis and not a vascular event. Also, she has a very patulous and fluid-filled esophagus which might indicate distal obstruction.  I would recommend further workup by the emergency room and primary team.  Impression: Evaluate for myasthenia gravis Evaluate for stroke (suspicion is low)  Recommendations: --MRI brain without contrast to look for any evidence of stroke.  If it shows stroke, then pursue stroke workup otherwise no stroke workup. --MG antibodies-binding/blocking/modulating --PT  OT and speech therapy for formal swallow eval.  --strictly n.p.o. until formal swallow eval. --Abdominal imaging for obstruction due to abnormal fluid accumulation in esophagus - per ER/Primary team --NIFs and FVCs qshift. --Primary malignancy workup if deemed appropriate by primary team, given history of weight loss.  Another possibility of the weight loss is progressive dysphagia leading to poor p.o. intake but family reports that her p.o. intake has been normal --Check TSH, B12 --Aspiration precautions  -- Milon Dikes, MD [facility] Pager: [phone] If 7pm to 7am, please call on call as listed on AMION.  CRITICAL CARE ATTESTATION This patient is critically ill and at significant risk of neurological worsening, death and care requires constant monitoring of vital signs, hemodynamics,respiratory and cardiac monitoring. I spent 45  minutes of neurocritical care time performing neurological assessment, discussion with family, other specialists and medical decision making of high complexityin the care of  this patient.

## 2017-12-12 ENCOUNTER — Other Ambulatory Visit: Payer: Self-pay

## 2017-12-12 ENCOUNTER — Inpatient Hospital Stay (HOSPITAL_COMMUNITY): Payer: Medicare Other

## 2017-12-12 ENCOUNTER — Encounter (HOSPITAL_COMMUNITY): Payer: Self-pay | Admitting: Radiology

## 2017-12-12 LAB — BASIC METABOLIC PANEL
Anion gap: 8 (ref 5–15)
BUN: 23 mg/dL — AB (ref 6–20)
CO2: 22 mmol/L (ref 22–32)
CREATININE: 0.57 mg/dL (ref 0.44–1.00)
Calcium: 8.4 mg/dL — ABNORMAL LOW (ref 8.9–10.3)
Chloride: 105 mmol/L (ref 101–111)
GFR calc Af Amer: >60 mL/min (ref >60)
Glucose, Bld: 84 mg/dL (ref 65–99)
POTASSIUM: 4.2 mmol/L (ref 3.5–5.1)
SODIUM: 135 mmol/L (ref 135–145)

## 2017-12-12 LAB — CBC
HCT: 27 % — ABNORMAL LOW (ref 36.0–46.0)
Hemoglobin: 9.1 g/dL — ABNORMAL LOW (ref 12.0–15.0)
MCH: 32.5 pg (ref 26.0–34.0)
MCHC: 33.7 g/dL (ref 30.0–36.0)
MCV: 96.4 fL (ref 78.0–100.0)
Platelets: 260 10*3/uL (ref 150–400)
RBC: 2.8 MIL/uL — AB (ref 3.87–5.11)
RDW: 16.5 % — AB (ref 11.5–15.5)
WBC: 6.4 10*3/uL (ref 4.0–10.5)

## 2017-12-12 LAB — C-REACTIVE PROTEIN: CRP: 0.9 mg/dL (ref <1.0)

## 2017-12-12 LAB — SEDIMENTATION RATE: Sed Rate: 15 mm/hr (ref 0–22)

## 2017-12-12 MED ORDER — IOPAMIDOL (ISOVUE-300) INJECTION 61%
INTRAVENOUS | Status: AC
  Administered 2017-12-12: 75 mL
  Filled 2017-12-12: qty 75

## 2017-12-12 MED ORDER — SENNOSIDES-DOCUSATE SODIUM 8.6-50 MG PO TABS
1.0000 | ORAL_TABLET | Freq: Every day | ORAL | Status: DC
  Administered 2017-12-12 – 2017-12-16 (×3): 1 via ORAL
  Filled 2017-12-12 (×4): qty 1

## 2017-12-12 MED ORDER — PYRIDOSTIGMINE BROMIDE 60 MG PO TABS
30.0000 mg | ORAL_TABLET | Freq: Three times a day (TID) | ORAL | Status: DC
  Administered 2017-12-12 – 2017-12-15 (×8): 30 mg via ORAL
  Filled 2017-12-12 (×10): qty 0.5

## 2017-12-12 MED ORDER — POLYETHYLENE GLYCOL 3350 17 G PO PACK
17.0000 g | PACK | Freq: Every day | ORAL | Status: DC
  Administered 2017-12-12: 17 g via ORAL
  Filled 2017-12-12: qty 1

## 2017-12-12 NOTE — Progress Notes (Addendum)
NEUROHOSPITALISTS - DAILY PROGRESS NOTE  SUBJECTIVE: No family is at the bedside. Patient is found laying in bed in NAD. Overall she feels her condition is unchanged. Voices no new complaints. No new/acute events reported overnight. Continues to have dysarthria, dysphagia and neck weakness. Patient unable to perform NIFs and FVCs per RT notes. Passed SLP evaluation today  Physical Examination   Vitals:   12/12/17 0334 12/12/17 0500 12/12/17 0547 12/12/17 0803  BP: (!) 103/50  111/61 103/89  Pulse: 67  72 71  Resp: 17  17   Temp:    98.9 F (37.2 C)  TempSrc:    Oral  SpO2: 96%  97%   Weight:  45.2 kg (99 lb 10.4 oz)    Height:       General - Frail, thin elderly woman, in no apparent distress HEENT-  Normocephalic,  Cardiovascular - Regular rate and rhythm  Respiratory - No SOB. No wheezing. Abdomen - soft and non-tender, BS normal Extremities- no edema or cyanosis  Neurological Examination  Patient is awake alert oriented x3. Her speech is dysarthric, guttural sounding. Naming, attention repetition is intact. Cranial nerves: Pupils are equal round reactive to light, right eye is slightly down with a dysconjugate gaze, compared to the left - baseline finding. Mild bilateral ptosis, extraocular movements otherwise are intact with no restriction or diplopia, facial sensation intact bilaterally, face is symmetric bilaterally, auditory acuity is mildly reduced bilaterally, palate does not elevate much bilaterally, shoulder shrug intact, tongue midline. Motor exam: Symmetric 5/5 strength all over with some fatigability.  Neck flexor and extensors are 4+/5. Sensory exam: Intact light touch all over with no extinction Coronation: Intact finger-nose-finger DTRs: Blunted all over  Laboratory Results  CBC:  Recent Labs  Lab 12/11/17 1344 12/11/17 1350 12/12/17 0401  WBC 9.8  --  6.4  HGB 10.7* 11.9* 9.1*  HCT 32.4* 35.0* 27.0*  MCV 94.7  --  96.4  PLT 298  --  260    BMP: Recent Labs  Lab 12/11/17 1344 12/11/17 1350 12/12/17 0401  NA 133* 135 135  K 4.5 4.6 4.2  CL 100* 101 105  CO2 24  --  22  GLUCOSE 116* 112* 84  BUN 35* 34* 23*  CREATININE 0.70 0.60 0.57  CALCIUM 9.4  --  8.4*   Liver Function Tests:  Recent Labs  Lab 12/11/17 1344  AST 22  ALT 11*  ALKPHOS 82  BILITOT 0.9  PROT 6.6  ALBUMIN 4.0   Coagulation Studies:  Recent Labs    12/11/17 1344  APTT 32  INR 0.95   Imaging Results   Ct Angio Head/ Neck W Or Wo Contrast Result Date: 12/11/2017 IMPRESSION: 1. Negative for emergent large vessel occlusion. Patent basilar artery without atherosclerosis or stenosis. 2. Positive for atherosclerosis with significant stenosis related to the intracranial circulation: - Left ICA origin moderate to severe stenosis (60-65%). - moderate to severe irregularity and stenosis in the distal Left M1 segment and trifurcation. - moderate to severe Right vertebral artery origin stenosis. - severe proximal Left subclavian artery stenosis due to soft plaque, stenosis (65-70%). 3. Chronic occlusion of the right ICA, stable since 2015, with reconstitution of the right ICA terminus from the right posterior communicating artery. 4. Dilated and obstructed appearing proximal thoracic esophagus. Consider benign versus malignant distal esophageal obstruction.   Ct C-spine No Charge Result Date: 12/11/2017 IMPRESSION: Dilated fluid and air-filled esophagus without causative lesion in the region of the scan. This could be due  to achalasia or distal obstruction. Degenerative changes of the spine as outlined above, seemingly unrelated to the complaint of dysphagia.   Ct Head Code Stroke Wo Contrast Result Date: 12/11/2017 IMPRESSION: 1. No acute intracranial abnormality and stable non contrast CT appearance of the brain since February. 2. ASPECTS is 10.  MRI Brain                                                                       PENDING  IMPRESSION AND  PLAN   Assessment:  84 year old woman with progressive generalized weakness, weight loss and difficulty swallowing with acute worsening of dysphagia brought in as an acute code stroke. No Acute CVA found on imaging. Found to have severe dysarthria, dysphagia and patulous, fluid-filled esophagus with possible distal obstruction.  Impression: Evaluate for myasthenia gravis Evaluate for posterior circulation stroke given bulbar symptoms-low on differential  Outstanding Work-up Studies:    MRI Brain without contrast MG antibodies-binding/blocking/modulating  TSH, B12  12/12/2017 PLAN  --MRI brain without contrast to look for any evidence of stroke  --Pursue stroke workup if MRI positive for CVA ---PT OT and SLP  --Abdominal imaging of esophagus - per ER/Primary team, ENT consulted --Continue to obtain NIFs and FVCs qshift per RT --Consider Primary malignancy workup if deemed appropriate by primary team,  --Continue Aspiration precautions --ESR, CRP Patient would benefit from Mestinon for her neuromuscular weakness eventually from myasthenia, but I am reluctant to start this without definitive lab evidence of this and pending workup for her possible GI obstruction as well as primary malignancy has Mestinon would increase her secretions and make managing her dysphagia and oral secretions more difficult.    Hospital day # 1 FAMILY UPDATES: No family at bedside  TEAM UPDATES: Alma Friendly, MD    Assessment & plan discussed with with attending physician and they are in agreement.    Mary A Costello. ANP-C [facility] 12/12/2017, 10:17 AM  12/12/2017:  ATTENDING ASSESSMENT  Patient seen and examined with APP/Resident. Agree with the history and physical as documented above. Agree with the plan as documented, which I helped formulate.  I have put my additional comments in the note above. I have independently reviewed the chart, obtained history, review of systems and  examined the patient.I have personally reviewed pertinent head/neck/spine imaging (CT/MRI). Please feel free to call with any questions. --- Amie Portland, MD [facility] Pager: [phone]  If 7pm to 7am, please call on call as listed on AMION.

## 2017-12-12 NOTE — Progress Notes (Signed)
NIF and VC performed per MD order. Pt had good effort Nif of -15cm H20 achieved and VC of 1.3L achieved.

## 2017-12-12 NOTE — Progress Notes (Signed)
PROGRESS NOTE  Teresa Callahan EYC:[id number] DOB: 08-28-1918 DOA: 12/11/2017 PCP: Marton Redwood, MD  HPI/Recap of past 24 hours: Teresa Callahan is a very pleasant 84 y.o. female with medical history significant for asthma, hypothyroidism, osteoporosis, GERD, carotid stenosis, hip fracture presents to the ER c/o of difficulty with swallowing mainly with pills. Family also noted episodes of drooling, which has become worse.  Family also reported around me D patient becomes very tired and sleeps on the couch, noted and neck is weak while sleeping.  Significant unintentional weight loss.  Initial evaluation concerning for possible CVA, posterior circulation stroke.  Neurology was consulted, also had concerns for possible myasthenia gravis. [facility] are asked to admit.  On MRI no acute CVA found.  Patient noted to have fluid-filled/thickened esophagus on CT.  Today, patient denies any new complaints, feels the same overall.  Continues to have dysphasia.  Passed swallow eval today, okay to eat  Assessment/Plan: Principal Problem:   Dysphagia Active Problems:   ANEMIA, IRON DEFICIENCY   Asthma, chronic   Hypothyroidism   Carotid stenosis   Closed left hip fracture (HCC)  Worsening dysphagia,??GI obstruction Unclear etiology History as noted above CT abdomen showed thick-walled mid distal esophagus more likely an inflammatory process than infiltrating tumor ENT on board, no further recommendation SLP on board, passed swallow eval, recommend regular diet Esophagram pending Consider consulting GI for possible endoscopy  ??Myasthenia gravis Presents with severe dysarthria, dysphasia, neck weakness Neurology on board suspecting myasthenia gravis MG antibodies pending ESR, CRP pending CT abdomen/pelvis/chest with no significant finding except above Neurology will hold off on starting Mestinon until workup complete and everything else excluded Continue NIFs and FVCs every shift per  RT Aspiration precautions  Stroke workup MRI brain without contrast negative for acute CVA PT/OT/SLP on board TSH, B12 pending  Microcytic anemia Iron panel, B12, folate pending Daily CBC  Asthma Stable Continue inhalers    Code Status: Full  Family Communication: None at bedside  Disposition Plan: Once workup complete   Consultants:  Neurology  ENT  Procedures:  None  Antimicrobials:  None  DVT prophylaxis: Enoxaparin   Objective: Vitals:   12/12/17 0334 12/12/17 0500 12/12/17 0547 12/12/17 0803  BP: (!) 103/50  111/61 103/89  Pulse: 67  72 71  Resp: 17  17   Temp:    98.9 F (37.2 C)  TempSrc:    Oral  SpO2: 96%  97%   Weight:  45.2 kg (99 lb 10.4 oz)    Height:        Intake/Output Summary (Last 24 hours) at 12/12/2017 1249 Last data filed at 12/12/2017 0259 Gross per 24 hour  Intake 1146.67 ml  Output -  Net 1146.67 ml   Filed Weights   12/11/17 1301 12/11/17 1707 12/12/17 0500  Weight: 54.4 kg (120 lb) 45.3 kg (99 lb 13.9 oz) 45.2 kg (99 lb 10.4 oz)    Exam:   General: NAD  Cardiovascular: S1, S2 present  Respiratory: CTAB  Abdomen: Soft, nontender, nondistended, bowel sounds present  Musculoskeletal: No pedal edema bilaterally  Skin: Multiple bruises noted on both upper and lower extremities  Psychiatry: Normal mood  Neuro: 5/5 strength in all extremities, dysarthria present   Data Reviewed: CBC: Recent Labs  Lab 12/11/17 1344 12/11/17 1350 12/12/17 0401  WBC 9.8  --  6.4  NEUTROABS 8.3*  --   --   HGB 10.7* 11.9* 9.1*  HCT 32.4* 35.0* 27.0*  MCV 94.7  --  96.4  PLT 298  --  161   Basic Metabolic Panel: Recent Labs  Lab 12/11/17 1344 12/11/17 1350 12/12/17 0401  NA 133* 135 135  K 4.5 4.6 4.2  CL 100* 101 105  CO2 24  --  22  GLUCOSE 116* 112* 84  BUN 35* 34* 23*  CREATININE 0.70 0.60 0.57  CALCIUM 9.4  --  8.4*   GFR: Estimated Creatinine Clearance: 28.7 mL/min (by C-G formula based on SCr of  0.57 mg/dL). Liver Function Tests: Recent Labs  Lab 12/11/17 1344  AST 22  ALT 11*  ALKPHOS 82  BILITOT 0.9  PROT 6.6  ALBUMIN 4.0   No results for input(s): LIPASE, AMYLASE in the last 168 hours. No results for input(s): AMMONIA in the last 168 hours. Coagulation Profile: Recent Labs  Lab 12/11/17 1344  INR 0.95   Cardiac Enzymes: No results for input(s): CKTOTAL, CKMB, CKMBINDEX, TROPONINI in the last 168 hours. BNP (last 3 results) No results for input(s): PROBNP in the last 8760 hours. HbA1C: No results for input(s): HGBA1C in the last 72 hours. CBG: Recent Labs  Lab 12/11/17 1350 12/11/17 2328  GLUCAP 106* 85   Lipid Profile: No results for input(s): CHOL, HDL, LDLCALC, TRIG, CHOLHDL, LDLDIRECT in the last 72 hours. Thyroid Function Tests: No results for input(s): TSH, T4TOTAL, FREET4, T3FREE, THYROIDAB in the last 72 hours. Anemia Panel: No results for input(s): VITAMINB12, FOLATE, FERRITIN, TIBC, IRON, RETICCTPCT in the last 72 hours. Urine analysis:    Component Value Date/Time   COLORURINE YELLOW 05/10/2014 0818   APPEARANCEUR CLEAR 05/10/2014 0818   LABSPEC 1.009 05/10/2014 0818   PHURINE 7.0 05/10/2014 0818   GLUCOSEU NEGATIVE 05/10/2014 0818   HGBUR TRACE (A) 05/10/2014 0818   BILIRUBINUR NEGATIVE 05/10/2014 0818   KETONESUR NEGATIVE 05/10/2014 0818   PROTEINUR 30 (A) 05/10/2014 0818   UROBILINOGEN 0.2 05/10/2014 0818   NITRITE NEGATIVE 05/10/2014 0818   LEUKOCYTESUR TRACE (A) 05/10/2014 0818   Sepsis Labs: '@LABRCNTIP'$ (procalcitonin:4,lacticidven:4)  )No results found for this or any previous visit (from the past 240 hour(s)).    Studies: Ct Angio Head W Or Wo Contrast  Result Date: 12/11/2017 CLINICAL DATA:  84 year old female with difficulty swallowing. Code stroke. Possible brainstem infarct. Recent weight loss. EXAM: CT ANGIOGRAPHY HEAD AND NECK TECHNIQUE: Multidetector CT imaging of the head and neck was performed using the standard  protocol during bolus administration of intravenous contrast. Multiplanar CT image reconstructions and MIPs were obtained to evaluate the vascular anatomy. Carotid stenosis measurements (when applicable) are obtained utilizing NASCET criteria, using the distal internal carotid diameter as the denominator. CONTRAST:  26m ISOVUE-370 IOPAMIDOL (ISOVUE-370) INJECTION 76% COMPARISON:  Head CT without contrast 1402 hours today. Brain MRI 05/10/2014. CT Abdomen and Pelvis 06/29/2013. FINDINGS: CTA NECK Skeleton: Advanced cervical spine degeneration. C3-C4 ankylosis. Upper thoracic spine degeneration. No acute osseous abnormality identified. Upper chest: Dilated thoracic esophagus up to 4.2 centimeters diameter with an air-fluid level of the thoracic inlet (series 5, image 143). The visible esophagus remains dilated to below the level of the carina. The distal esophagus on 06/29/2013 appear normal. Negative upper lungs.  No superior mediastinal lymphadenopathy. Other neck: Negative.  No neck mass or lymphadenopathy. Aortic arch: Calcified aortic atherosclerosis. Three vessel arch configuration. Right carotid system: No brachiocephalic or right CCA origin stenosis despite atherosclerosis. There is additional soft plaque in the right CCA, and bulky calcified plaque at the right carotid bifurcation. The right ICA is chronically occluded, unchanged since the 2015 brain  MRI. No reconstituted flow in the neck. Left carotid system: No left CCA origin stenosis despite atherosclerosis. At the left carotid bifurcation there is soft and calcified plaque with proximal left ICA stenosis numerically estimated at 60-65 % with respect to the distal vessel (series 8, image 87). The left ICA remains patent to the skull base without additional stenosis. The left ECA origin is incidentally occluded at its origin, with reconstituted flow in left ECA branches. Vertebral arteries: No proximal right subclavian artery stenosis despite plaque.  Calcified plaque at the right vertebral artery origin with moderate to severe stenosis. The right vertebral then is patent to the skull base without additional stenosis. There is bulky soft plaque in the proximal left subclavian artery resulting in high-grade stenosis numerically estimated at 65-70 % with respect to the distal vessel. The left subclavian remains patent, and the left vertebral artery origin is patent without stenosis despite mild calcified plaque. The left vertebral artery is patent to the skull base without additional stenosis. Intermittent left vertebral tortuosity. CTA HEAD Posterior circulation: The distal vertebral arteries are patent. Both PICA origins are patent. There is mild left V4 segment plaque without stenosis. No right V4 segment stenosis. Patent vertebrobasilar junction. Patent basilar artery without plaque or stenosis. SCA and PCA origins are patent and within normal limits. Both posterior communicating arteries are present. The left PCA branches are within normal limits. There is moderate irregularity and stenosis in the right PCA P2 and P3 segments which appears due to atherosclerosis. Anterior circulation: The proximal right ICA siphon is occluded. The right ICA terminus appears reconstituted from the right posterior communicating artery. The left ICA siphon is patent without stenosis despite calcified plaque. Normal left ophthalmic and posterior communicating artery origins. Both carotid termini are patent. Both MCA and ACA origins are patent. The anterior communicating artery and bilateral ACA branches are within normal limits. The proximal left MCA M1 segment is normal but distally there is moderate to severe irregularity and stenosis (series 11, image 20). The left MCA trifurcation remains patent. There is only mild irregularity of left MCA branches. The right MCA proximal M1 segment is mildly irregular and stenotic. The distal right M1 appears normal. The right MCA bifurcation  is patent. The right MCA branches are within normal limits. Venous sinuses: Patent on the delayed images. Anatomic variants: None. Delayed phase: No abnormal enhancement identified. Stable gray-white matter differentiation throughout the brain. Review of the MIP images confirms the above findings IMPRESSION: 1. Negative for emergent large vessel occlusion. Patent basilar artery without atherosclerosis or stenosis. 2. Positive for atherosclerosis with significant stenosis related to the intracranial circulation: - Left ICA origin moderate to severe stenosis (60-65%). - moderate to severe irregularity and stenosis in the distal Left M1 segment and trifurcation. - moderate to severe Right vertebral artery origin stenosis. - severe proximal Left subclavian artery stenosis due to soft plaque, stenosis (65-70%). 3. Chronic occlusion of the right ICA, stable since 2015, with reconstitution of the right ICA terminus from the right posterior communicating artery. 4. Dilated and obstructed appearing proximal thoracic esophagus. Consider benign versus malignant distal esophageal obstruction. 5. Stable CT appearance of the brain since 1402 hours today. Salient findings were discussed by telephone with Dr. Amie Portland on 12/11/2017 beginning at 1541 hours. Electronically Signed   By: Genevie Ann M.D.   On: 12/11/2017 15:55   Dg Chest 2 View  Result Date: 12/11/2017 CLINICAL DATA:  Dysphagia, short of breath EXAM: CHEST - 2 VIEW COMPARISON:  10/31/2017,  07/13/2012 FINDINGS: No focal pulmonary opacity or significant effusion. Stable mild cardiomegaly with aortic atherosclerosis. No pneumothorax. Gibbus deformity at the thoracolumbar junction with marked compression deformities, uncertain age but new since 2013. Calcified left hilar nodes. IMPRESSION: 1. Mild cardiomegaly without edema or infiltrate. 2. Kyphotic deformity at the thoracolumbar junction with marked compression deformities. Electronically Signed   By: Donavan Foil  M.D.   On: 12/11/2017 15:13   Ct Angio Neck W Or Wo Contrast  Result Date: 12/11/2017 CLINICAL DATA:  84 year old female with difficulty swallowing. Code stroke. Possible brainstem infarct. Recent weight loss. EXAM: CT ANGIOGRAPHY HEAD AND NECK TECHNIQUE: Multidetector CT imaging of the head and neck was performed using the standard protocol during bolus administration of intravenous contrast. Multiplanar CT image reconstructions and MIPs were obtained to evaluate the vascular anatomy. Carotid stenosis measurements (when applicable) are obtained utilizing NASCET criteria, using the distal internal carotid diameter as the denominator. CONTRAST:  49m ISOVUE-370 IOPAMIDOL (ISOVUE-370) INJECTION 76% COMPARISON:  Head CT without contrast 1402 hours today. Brain MRI 05/10/2014. CT Abdomen and Pelvis 06/29/2013. FINDINGS: CTA NECK Skeleton: Advanced cervical spine degeneration. C3-C4 ankylosis. Upper thoracic spine degeneration. No acute osseous abnormality identified. Upper chest: Dilated thoracic esophagus up to 4.2 centimeters diameter with an air-fluid level of the thoracic inlet (series 5, image 143). The visible esophagus remains dilated to below the level of the carina. The distal esophagus on 06/29/2013 appear normal. Negative upper lungs.  No superior mediastinal lymphadenopathy. Other neck: Negative.  No neck mass or lymphadenopathy. Aortic arch: Calcified aortic atherosclerosis. Three vessel arch configuration. Right carotid system: No brachiocephalic or right CCA origin stenosis despite atherosclerosis. There is additional soft plaque in the right CCA, and bulky calcified plaque at the right carotid bifurcation. The right ICA is chronically occluded, unchanged since the 2015 brain MRI. No reconstituted flow in the neck. Left carotid system: No left CCA origin stenosis despite atherosclerosis. At the left carotid bifurcation there is soft and calcified plaque with proximal left ICA stenosis numerically  estimated at 60-65 % with respect to the distal vessel (series 8, image 87). The left ICA remains patent to the skull base without additional stenosis. The left ECA origin is incidentally occluded at its origin, with reconstituted flow in left ECA branches. Vertebral arteries: No proximal right subclavian artery stenosis despite plaque. Calcified plaque at the right vertebral artery origin with moderate to severe stenosis. The right vertebral then is patent to the skull base without additional stenosis. There is bulky soft plaque in the proximal left subclavian artery resulting in high-grade stenosis numerically estimated at 65-70 % with respect to the distal vessel. The left subclavian remains patent, and the left vertebral artery origin is patent without stenosis despite mild calcified plaque. The left vertebral artery is patent to the skull base without additional stenosis. Intermittent left vertebral tortuosity. CTA HEAD Posterior circulation: The distal vertebral arteries are patent. Both PICA origins are patent. There is mild left V4 segment plaque without stenosis. No right V4 segment stenosis. Patent vertebrobasilar junction. Patent basilar artery without plaque or stenosis. SCA and PCA origins are patent and within normal limits. Both posterior communicating arteries are present. The left PCA branches are within normal limits. There is moderate irregularity and stenosis in the right PCA P2 and P3 segments which appears due to atherosclerosis. Anterior circulation: The proximal right ICA siphon is occluded. The right ICA terminus appears reconstituted from the right posterior communicating artery. The left ICA siphon is patent without stenosis despite calcified  plaque. Normal left ophthalmic and posterior communicating artery origins. Both carotid termini are patent. Both MCA and ACA origins are patent. The anterior communicating artery and bilateral ACA branches are within normal limits. The proximal left  MCA M1 segment is normal but distally there is moderate to severe irregularity and stenosis (series 11, image 20). The left MCA trifurcation remains patent. There is only mild irregularity of left MCA branches. The right MCA proximal M1 segment is mildly irregular and stenotic. The distal right M1 appears normal. The right MCA bifurcation is patent. The right MCA branches are within normal limits. Venous sinuses: Patent on the delayed images. Anatomic variants: None. Delayed phase: No abnormal enhancement identified. Stable gray-white matter differentiation throughout the brain. Review of the MIP images confirms the above findings IMPRESSION: 1. Negative for emergent large vessel occlusion. Patent basilar artery without atherosclerosis or stenosis. 2. Positive for atherosclerosis with significant stenosis related to the intracranial circulation: - Left ICA origin moderate to severe stenosis (60-65%). - moderate to severe irregularity and stenosis in the distal Left M1 segment and trifurcation. - moderate to severe Right vertebral artery origin stenosis. - severe proximal Left subclavian artery stenosis due to soft plaque, stenosis (65-70%). 3. Chronic occlusion of the right ICA, stable since 2015, with reconstitution of the right ICA terminus from the right posterior communicating artery. 4. Dilated and obstructed appearing proximal thoracic esophagus. Consider benign versus malignant distal esophageal obstruction. 5. Stable CT appearance of the brain since 1402 hours today. Salient findings were discussed by telephone with Dr. Amie Portland on 12/11/2017 beginning at 1541 hours. Electronically Signed   By: Genevie Ann M.D.   On: 12/11/2017 15:55   Ct Chest W Contrast  Result Date: 12/12/2017 CLINICAL DATA:  Unintentional weight loss.  Dilated esophagus. EXAM: CT CHEST, ABDOMEN, AND PELVIS WITH CONTRAST TECHNIQUE: Multidetector CT imaging of the chest, abdomen and pelvis was performed following the standard protocol  during bolus administration of intravenous contrast. CONTRAST:  34m ISOVUE-300 IOPAMIDOL (ISOVUE-300) INJECTION 61% COMPARISON:  None. FINDINGS: CT CHEST FINDINGS Cardiovascular: The heart is normal in size. No pericardial effusion. Advanced atherosclerotic calcifications involving the thoracic aorta but no aneurysm or dissection. Significant stenosis of the left subclavian artery is noted. Coronary artery calcifications noted. Mediastinum/Nodes: Scattered sub 8 mm mediastinal and hilar lymph nodes. Calcified right hilar lymph nodes are noted. Thick walled mid distal esophagus with suspected edema. Could not exclude infiltrating tumor but esophagitis is also possible. Recommend endoscopy or esophagram. Lungs/Pleura: Emphysematous changes but no acute pulmonary findings such as infiltrate or edema. No worrisome pulmonary lesions or pulmonary nodules. Calcified granulomas are noted in the left lower lobe with adjacent calcified left hilar lymph nodes. Mild pleural thickening at the left lung base and scarring. Musculoskeletal: No acute bony findings or worrisome bone lesions. Remote posttraumatic changes involving the lower thoracic spine with kyphotic deformity. CT ABDOMEN PELVIS FINDINGS Hepatobiliary: Calcified granulomas are scattered throughout the liver. No worrisome hepatic lesions or intrahepatic biliary dilatation. The gallbladder demonstrates some layering high attenuation material which could be stones or sludge. No common bile duct dilatation. Pancreas: No mass, inflammation or ductal dilatation. Spleen: Calcified granulomas but no enlargement or mass. Adrenals/Urinary Tract: The adrenal glands and kidneys are unremarkable. Renal cortical thinning and scarring but no mass or hydronephrosis. The bladder is unremarkable. Stomach/Bowel: The stomach demonstrates mild diffuse wall thickening and possible edema. Diffuse gastritis is a possibility. The duodenum, small bowel and colon are grossly normal. No acute  inflammatory process, mass lesions  or obstructive findings. Severe sigmoid diverticulosis without findings for acute diverticulitis. Large amount of stool in the rectum suggesting fecal impaction. The terminal ileum is unremarkable. Vascular/Lymphatic: Severe atherosclerotic calcifications involving the abdominal aorta and branch vessel ostia. No focal aneurysm or dissection. No mesenteric or retroperitoneal mass or adenopathy. Reproductive: Surgically absent. Other: No pelvic mass or free pelvic fluid collections. No inguinal adenopathy or hernia. Musculoskeletal: Remote posttraumatic changes involving the pelvis and left hip. No acute bony findings or worrisome bone lesions. IMPRESSION: 1. Thick-walled mid distal esophagus more likely an inflammatory process than infiltrating tumor. Similar findings involving the stomach suggesting gastritis. Endoscopy may be helpful for further evaluation. No obvious ulcer. 2. No worrisome pulmonary lesions or acute pulmonary findings. Calcified granulomas noted in the left lower lobe along with calcified left hilar lymph nodes. 3. No abdominal/pelvic mass or lymphadenopathy. Electronically Signed   By: Marijo Sanes M.D.   On: 12/12/2017 12:06   Mr Brain Wo Contrast  Result Date: 12/12/2017 CLINICAL DATA:  Focal neuro deficit, greater than 6 hours, stroke suspected. Choking episode yesterday. EXAM: MRI HEAD WITHOUT CONTRAST TECHNIQUE: Multiplanar, multiecho pulse sequences of the brain and surrounding structures were obtained without intravenous contrast. COMPARISON:  CT head and CTA head and neck 12/11/2017. MRI brain 05/10/2014. FINDINGS: Brain: Advanced atrophy and white matter disease is similar the prior exam. Ventricles are proportionate to the degree of atrophy. No significant extra-axial fluid collection is present. Brainstem and cerebellum are normal. Cerebellar atrophy is stable. The internal auditory canals are within normal limits bilaterally. Vascular: Abnormal  signal is compatible with chronic right internal carotid artery occlusion. The artery is reconstituted at the level of the right posterior communicating artery. There is flow in the left internal carotid artery and both vertebral arteries. Basilar artery is patent. Skull and upper cervical spine: The skull base is within normal limits. The craniocervical junction is normal. Sinuses/Orbits: The paranasal sinuses are clear. There is some fluid in the mastoid air cells bilaterally. No obstructing nasopharyngeal lesion is evident. Bilateral lens replacements are present. Globes and orbits are otherwise within normal limits. IMPRESSION: 1. No acute intracranial abnormality or significant interval change. 2. Stable atrophy and white matter disease. 3. Chronic right ICA occlusion. Electronically Signed   By: San Morelle M.D.   On: 12/12/2017 12:06   Ct Abdomen Pelvis W Contrast  Result Date: 12/12/2017 CLINICAL DATA:  Unintentional weight loss.  Dilated esophagus. EXAM: CT CHEST, ABDOMEN, AND PELVIS WITH CONTRAST TECHNIQUE: Multidetector CT imaging of the chest, abdomen and pelvis was performed following the standard protocol during bolus administration of intravenous contrast. CONTRAST:  73m ISOVUE-300 IOPAMIDOL (ISOVUE-300) INJECTION 61% COMPARISON:  None. FINDINGS: CT CHEST FINDINGS Cardiovascular: The heart is normal in size. No pericardial effusion. Advanced atherosclerotic calcifications involving the thoracic aorta but no aneurysm or dissection. Significant stenosis of the left subclavian artery is noted. Coronary artery calcifications noted. Mediastinum/Nodes: Scattered sub 8 mm mediastinal and hilar lymph nodes. Calcified right hilar lymph nodes are noted. Thick walled mid distal esophagus with suspected edema. Could not exclude infiltrating tumor but esophagitis is also possible. Recommend endoscopy or esophagram. Lungs/Pleura: Emphysematous changes but no acute pulmonary findings such as infiltrate  or edema. No worrisome pulmonary lesions or pulmonary nodules. Calcified granulomas are noted in the left lower lobe with adjacent calcified left hilar lymph nodes. Mild pleural thickening at the left lung base and scarring. Musculoskeletal: No acute bony findings or worrisome bone lesions. Remote posttraumatic changes involving the lower thoracic spine with  kyphotic deformity. CT ABDOMEN PELVIS FINDINGS Hepatobiliary: Calcified granulomas are scattered throughout the liver. No worrisome hepatic lesions or intrahepatic biliary dilatation. The gallbladder demonstrates some layering high attenuation material which could be stones or sludge. No common bile duct dilatation. Pancreas: No mass, inflammation or ductal dilatation. Spleen: Calcified granulomas but no enlargement or mass. Adrenals/Urinary Tract: The adrenal glands and kidneys are unremarkable. Renal cortical thinning and scarring but no mass or hydronephrosis. The bladder is unremarkable. Stomach/Bowel: The stomach demonstrates mild diffuse wall thickening and possible edema. Diffuse gastritis is a possibility. The duodenum, small bowel and colon are grossly normal. No acute inflammatory process, mass lesions or obstructive findings. Severe sigmoid diverticulosis without findings for acute diverticulitis. Large amount of stool in the rectum suggesting fecal impaction. The terminal ileum is unremarkable. Vascular/Lymphatic: Severe atherosclerotic calcifications involving the abdominal aorta and branch vessel ostia. No focal aneurysm or dissection. No mesenteric or retroperitoneal mass or adenopathy. Reproductive: Surgically absent. Other: No pelvic mass or free pelvic fluid collections. No inguinal adenopathy or hernia. Musculoskeletal: Remote posttraumatic changes involving the pelvis and left hip. No acute bony findings or worrisome bone lesions. IMPRESSION: 1. Thick-walled mid distal esophagus more likely an inflammatory process than infiltrating tumor.  Similar findings involving the stomach suggesting gastritis. Endoscopy may be helpful for further evaluation. No obvious ulcer. 2. No worrisome pulmonary lesions or acute pulmonary findings. Calcified granulomas noted in the left lower lobe along with calcified left hilar lymph nodes. 3. No abdominal/pelvic mass or lymphadenopathy. Electronically Signed   By: Marijo Sanes M.D.   On: 12/12/2017 12:06   Ct C-spine No Charge  Result Date: 12/11/2017 CLINICAL DATA:  Dysphagia. EXAM: CT CERVICAL SPINE WITHOUT CONTRAST TECHNIQUE: Multidetector CT imaging of the cervical spine was performed without intravenous contrast. Multiplanar CT image reconstructions were also generated. COMPARISON:  10/31/2017 FINDINGS: Alignment: Normal Skull base and vertebrae: No fracture or primary bone lesion. Soft tissues and spinal canal: No canal compromise. The esophagus is markedly distended and filled with fluid and gas in the region. The differential diagnosis is achalasia versus distal obstruction. Disc levels: Ordinary osteoarthritis at the C1-2 level. Fusion from C2 through C4. C4-5 facet osteoarthritis on the left with 2 mm of anterolisthesis. Bony left foraminal narrowing. C5-6 fusion. C6-7 degenerative spondylosis and facet arthritis without notable stenosis. C7-T1 and T1-2 facet arthritis without notable stenosis. Upper thoracic region shows non-compressive degenerative changes. Upper chest: Lung apices are clear. Other: None IMPRESSION: Dilated fluid and air-filled esophagus without causative lesion in the region of the scan. This could be due to achalasia or distal obstruction. Degenerative changes of the spine as outlined above, seemingly unrelated to the complaint of dysphagia. Electronically Signed   By: Nelson Chimes M.D.   On: 12/11/2017 15:53   Ct Head Code Stroke Wo Contrast  Result Date: 12/11/2017 CLINICAL DATA:  Code stroke. 84 year old female with difficulty swallowing. EXAM: CT HEAD WITHOUT CONTRAST  TECHNIQUE: Contiguous axial images were obtained from the base of the skull through the vertex without intravenous contrast. COMPARISON:  Head CT without contrast 10/31/2017. Brain MRI 05/10/2014 FINDINGS: Brain: Stable cerebral volume. Posterior fossa gray-white matter differentiation appears stable since February. Stable gray-white matter differentiation elsewhere with mild for age patchy cerebral white matter hypodensity. No midline shift, ventriculomegaly, mass effect, evidence of mass lesion, intracranial hemorrhage or evidence of cortically based acute infarction. No cortical encephalomalacia identified. Vascular: Calcified atherosclerosis at the skull base. No suspicious intracranial vascular hyperdensity. Skull: Stable.  No acute osseous abnormality identified. Sinuses/Orbits:  Visualized paranasal sinuses and mastoids are stable and well pneumatized. Other: No acute orbit or scalp soft tissue findings. ASPECTS [facility] Stroke Program Early CT Score) - Ganglionic level infarction (caudate, lentiform nuclei, internal capsule, insula, M1-M3 cortex): 7 - Supraganglionic infarction (M4-M6 cortex): 3 Total score (0-10 with 10 being normal): 10 IMPRESSION: 1. No acute intracranial abnormality and stable non contrast CT appearance of the brain since February. 2. ASPECTS is 10. 3. These results were communicated to Dr. Rory Percy at 2:44 pmon 3/22/2019by text page via the [facility] messaging system. Electronically Signed   By: Genevie Ann M.D.   On: 12/11/2017 14:44    Scheduled Meds: . enoxaparin  30 mg Subcutaneous Q24H    Continuous Infusions:   LOS: 1 day     Alma Friendly, MD [facility]   If 7PM-7AM, please contact night-coverage [email or web address] Password [facility] 12/12/2017, 12:49 PM

## 2017-12-12 NOTE — Evaluation (Signed)
Clinical/Bedside Swallow Evaluation Patient Details  Name: Teresa GuileMargery O Callahan MRN: [id number] Date of Birth: [birth year]  Today's Date: 12/12/2017 Time: SLP Start Time (ACUTE ONLY): 0840 SLP Stop Time (ACUTE ONLY): 0905 SLP Time Calculation (min) (ACUTE ONLY): 25 min  Past Medical History:  Past Medical History:  Diagnosis Date  . Anemia   . Asthma   . Atrophic vaginitis   . Back pain   . Carotid stenosis   . Diverticulosis   . GERD (gastroesophageal reflux disease)   . History of blood transfusion 2014   "not related to a surgery"  . Hypothyroidism   . Incontinence   . Osteoporosis   . Pneumonia    "probably" (05/09/2014)  . Postmenopausal   . Thyroid disease   . UTI (lower urinary tract infection)   . Vertigo    Past Surgical History:  Past Surgical History:  Procedure Laterality Date  . ABDOMINAL HYSTERECTOMY  1957  . CATARACT EXTRACTION W/ INTRAOCULAR LENS  IMPLANT, BILATERAL Bilateral   . COLONOSCOPY  09/30/2001  . EYE SURGERY Left    d/t complications from cataract surgery  . HIP PINNING,CANNULATED Left 11/01/2017  . HIP PINNING,CANNULATED Left 11/01/2017   Procedure: CANNULATED HIP PINNING;  Surgeon: Roby LoftsHaddix, Kevin P, MD;  Location: [facility] OR;  Service: Orthopedics;  Laterality: Left;  . TONSILLECTOMY     HPI:  84 y.o. female with medical history significant for asthma, hypothyroidism, osteoporosis, GERD, carotid stenosis, ascending hip fracture presents to the emergency department with the chief complaint difficulty swallowing. Initial evaluation concerning for possible CVA or myasthenia gravis;  12/11/17 CXR indicated Mild cardiomegaly without edema or infiltrate. 2. Kyphotic deformity at the thoracolumbar junction with marked compression deformities; CT chest pending; CT head/neck 12/11/17 negative for possible vessel occlusion, but Dilated and obstructed appearing proximal thoracic esophagus. Consider benign versus malignant distal esophageal obstruction; pt c/o dysphagia  stating "I couldn't get the food down."; BSE ordered.  Assessment / Plan / Recommendation Clinical Impression   Pt without overt s/s of aspiration noted with any consistency administered during BSE ranging from thin liquids of varying volumes, puree and solids; pt did exhibit a hypernasal quality during conversation and OME noted palatal/velar swelling/? Potential hypertrophy?; recommend Regular/thin liquid diet; ST will f/u for diet tolerance while in acute setting; MRI pending/CT chest pending; ENT consult initiated. SLP Visit Diagnosis: Dysphagia, unspecified (R13.10)    Aspiration Risk  Mild aspiration risk    Diet Recommendation   Regular/thin liquids  Medication Administration: Whole meds with liquid    Other  Recommendations Oral Care Recommendations: Oral care BID   Follow up Recommendations Other (comment)(TBD)      Frequency and Duration min 2x/week  1 week       Prognosis Prognosis for Safe Diet Advancement: Good Barriers to Reach Goals: Other (Comment)(potential structural deviation)      Swallow Study   General Date of Onset: 12/11/17 HPI: 84 y.o. female with medical history significant for asthma, hypothyroidism, osteoporosis, GERD, carotid stenosis, ascending hip fracture presents to the emergency department with the chief complaint difficulty swallowing. Initial evaluation concerning for possible CVA for myasthenia gravis. Type of Study: Bedside Swallow Evaluation Diet Prior to this Study: NPO Temperature Spikes Noted: No Respiratory Status: Room air History of Recent Intubation: No Behavior/Cognition: Alert;Cooperative;Pleasant mood Oral Cavity Assessment: Other (comment)(? Hypertrophy/swelling of palate/velum) Oral Care Completed by SLP: Recent completion by staff Oral Cavity - Dentition: Adequate natural dentition;Poor condition Vision: Functional for self-feeding Self-Feeding Abilities: Able to feed self;Needs assist;Needs set  up Patient Positioning:  Upright in bed Baseline Vocal Quality: Other (comment)(hypernasal quality to speech) Volitional Cough: Strong Volitional Swallow: Able to elicit    Oral/Motor/Sensory Function Overall Oral Motor/Sensory Function: Generalized oral weakness Facial ROM: Within Functional Limits Facial Symmetry: Within Functional Limits Velum: Other (comment)(hypertrophic)   Ice Chips Ice chips: Within functional limits Presentation: Spoon   Thin Liquid Thin Liquid: Within functional limits Presentation: Cup;Spoon Other Comments: (Pt refused straw; gave larger sips)    Nectar Thick Nectar Thick Liquid: Not tested   Honey Thick Honey Thick Liquid: Not tested   Puree Puree: Within functional limits Presentation: Spoon   Solid      Solid: Within functional limits Presentation: Self Fed        Teresa Callahan, M.S., CCC-SLP 12/12/2017,10:49 AM

## 2017-12-12 NOTE — Progress Notes (Addendum)
Same day progress note-imaging reviewed  MRI of the brain completed and reviewed.  No acute changes.  No stroke.  Shows chronic right ICA occlusion that was also visualized on the CTA.  Reports of CT chest abdomen pelvis reviewed-no sign of malignancy.  Possible inflammatory process than infiltrating tumor responsible for the findings in the esophagus. No evidence of obstruction  GI workup pending at this time.  Recommendations -At this time, my suspicion for myasthenia gravis still persists. -Mestinon would be beneficial to treat her at this time. No signs ofGI obstruction at this time based on CT abd. Given her severe dysphagia with acute onset, I had held off on using Mestinon acutely.  She has now received a formal swallow evaluation which has cleared her for a diet, ENT eval as well as abdominal imaging with no evidence of obstruction - so I believe that in a controlled setting such as her hospital admission this time, we can start Mestinon and observe her clinically. I would recommend starting Mestinon 30 mg every 8 hours. We will follow. -- A. Wilford CornerArora, MD Neurologist

## 2017-12-12 NOTE — Evaluation (Addendum)
Physical Therapy Evaluation Patient Details Name: Teresa Callahan MRN: [id number] DOB: 06-04-1918 Today's Date: 12/12/2017   History of Present Illness  84 y.o. female with medical history significant for asthma, hypothyroidism, osteoporosis, GERD, carotid stenosis, ascending hip fracture presents to the emergency department with the chief complaint difficulty swallowing. Initial evaluation concerning for possible CVA or myasthenia gravis;  12/11/17 CXR indicated Mild cardiomegaly without edema or infiltrate. 2. Kyphotic deformity at the thoracolumbar junction with marked compression deformities; CT chest pending; CT head/neck 12/11/17 negative for possible vessel occlusion. Patient with recent history of fall/with multiple fractures and was at rehab facility until a approximately 10 days ago.  Clinical Impression  Orders received for PT evaluation. Patient demonstrates deficits in functional mobility as indicated below. Will benefit from continued skilled PT to address deficits and maximize function. Will see as indicated and progress as tolerated.  AT this time, patient evaluation limited to in room mobility for which patient required hands on assist at all times. Patient is very high fall risk, will need 24/7 care, if family unable to provide may need to consider SNF.    Follow Up Recommendations Home health PT;Supervision/Assistance - 24 hour(if unable to provide may need to reconsider SNF; may benefit from in home program with increased support)    Equipment Recommendations  None recommended by PT    Recommendations for Other Services       Precautions / Restrictions Precautions Precautions: Fall      Mobility  Bed Mobility Overal bed mobility: Needs Assistance Bed Mobility: Supine to Sit;Sit to Supine     Supine to sit: Min assist Sit to supine: Min assist   General bed mobility comments: min assist for trunk elevation and rotation to EOB, assist to elevate LEs back to  bed  Transfers Overall transfer level: Needs assistance Equipment used: Rolling walker (2 wheeled) Transfers: Sit to/from Stand Sit to Stand: Min assist         General transfer comment: Min assist for stability during power up  Ambulation/Gait Ambulation/Gait assistance: Min assist Ambulation Distance (Feet): 20 Feet Assistive device: Rolling walker (2 wheeled) Gait Pattern/deviations: Shuffle;Decreased stride length;Drifts right/left;Trunk flexed(list to the left, decreased weight shift/clearance) Gait velocity: decreased Gait velocity interpretation: <1.8 ft/sec, indicative of risk for recurrent falls General Gait Details: patient with noted flexecd posture and veers left with RW, max cues, patient reluctant to follow.   Stairs            Wheelchair Mobility    Modified Rankin (Stroke Patients Only) Modified Rankin (Stroke Patients Only) Pre-Morbid Rankin Score: Moderate disability Modified Rankin: Moderately severe disability     Balance Overall balance assessment: History of Falls                                           Pertinent Vitals/Pain Pain Assessment: No/denies pain    Home Living Family/patient expects to be discharged to:: Private residence Living Arrangements: Children Available Help at Discharge: Family;Available PRN/intermittently Type of Home: House Home Access: Level entry     Home Layout: Two level   Additional Comments: son is home part of the time with her    Prior Function Level of Independence: Needs assistance   Gait / Transfers Assistance Needed: uses RW and rollator, transport chair outside the home  ADL's / Homemaking Assistance Needed: son assisted around the house, with mobility. Patient is able  to do her own dressing until recently having an aide come in        Hand Dominance   Dominant Hand: Right    Extremity/Trunk Assessment   Upper Extremity Assessment Upper Extremity Assessment: Defer to  OT evaluation;Generalized weakness    Lower Extremity Assessment Lower Extremity Assessment: Generalized weakness;RLE deficits/detail;LLE deficits/detail RLE Coordination: decreased fine motor;decreased gross motor LLE Deficits / Details: some limited strength and mobility due to recent fracture s/p ORIF(recent hip fx/ORIF) LLE Coordination: decreased fine motor;decreased gross motor    Cervical / Trunk Assessment Cervical / Trunk Assessment: Kyphotic  Communication   Communication: No difficulties  Cognition Arousal/Alertness: Awake/alert Behavior During Therapy: WFL for tasks assessed/performed Overall Cognitive Status: Within Functional Limits for tasks assessed                                        General Comments      Exercises     Assessment/Plan    PT Assessment Patient needs continued PT services  PT Problem List Decreased strength;Decreased activity tolerance;Decreased balance;Decreased mobility;Decreased safety awareness;Decreased coordination       PT Treatment Interventions DME instruction;Gait training;Functional mobility training;Therapeutic activities;Therapeutic exercise;Balance training;Patient/family education;Neuromuscular re-education    PT Goals (Current goals can be found in the Care Plan section)  Acute Rehab PT Goals Patient Stated Goal: to go home PT Goal Formulation: With patient/family Time For Goal Achievement: 12/26/17 Potential to Achieve Goals: Fair    Frequency Min 3X/week   Barriers to discharge Decreased caregiver support      Co-evaluation               AM-PAC PT "6 Clicks" Daily Activity  Outcome Measure Difficulty turning over in bed (including adjusting bedclothes, sheets and blankets)?: Unable Difficulty moving from lying on back to sitting on the side of the bed? : Unable Difficulty sitting down on and standing up from a chair with arms (e.g., wheelchair, bedside commode, etc,.)?: Unable Help needed  moving to and from a bed to chair (including a wheelchair)?: A Little Help needed walking in hospital room?: A Little Help needed climbing 3-5 steps with a railing? : A Lot 6 Click Score: 11    End of Session Equipment Utilized During Treatment: Gait belt Activity Tolerance: Patient limited by fatigue Patient left: in bed;with call bell/phone within reach;with bed alarm set;with family/visitor present Nurse Communication: Mobility status PT Visit Diagnosis: Unsteadiness on feet (R26.81);History of falling (Z91.81);Muscle weakness (generalized) (M62.81)    Time: 4098-1191 PT Time Calculation (min) (ACUTE ONLY): 18 min   Charges:   PT Evaluation $PT Eval Moderate Complexity: 1 Mod     PT G Codes:        Charlotte Crumb, PT DPT  Board Certified Neurologic Specialist [phone]   Fabio Asa 12/12/2017, 4:09 PM

## 2017-12-12 NOTE — Progress Notes (Signed)
Patient's IV infiltrated. Attempt x1 with no success. IV team was consulted. They asked that we check with attending MD about IV being left out due to it not being used for anything. MD paged, awaiting response .

## 2017-12-13 DIAGNOSIS — D509 Iron deficiency anemia, unspecified: Secondary | ICD-10-CM

## 2017-12-13 DIAGNOSIS — D519 Vitamin B12 deficiency anemia, unspecified: Secondary | ICD-10-CM

## 2017-12-13 DIAGNOSIS — G7000 Myasthenia gravis without (acute) exacerbation: Principal | ICD-10-CM

## 2017-12-13 DIAGNOSIS — I639 Cerebral infarction, unspecified: Secondary | ICD-10-CM

## 2017-12-13 DIAGNOSIS — K117 Disturbances of salivary secretion: Secondary | ICD-10-CM

## 2017-12-13 LAB — BASIC METABOLIC PANEL
Anion gap: 8 (ref 5–15)
BUN: 17 mg/dL (ref 6–20)
CALCIUM: 9.2 mg/dL (ref 8.9–10.3)
CHLORIDE: 103 mmol/L (ref 101–111)
CO2: 26 mmol/L (ref 22–32)
CREATININE: 0.57 mg/dL (ref 0.44–1.00)
GFR calc Af Amer: >60 mL/min (ref >60)
GFR calc non Af Amer: >60 mL/min (ref >60)
GLUCOSE: 92 mg/dL (ref 65–99)
Potassium: 4 mmol/L (ref 3.5–5.1)
Sodium: 137 mmol/L (ref 135–145)

## 2017-12-13 LAB — TSH: TSH: 3.332 u[IU]/mL (ref 0.350–4.500)

## 2017-12-13 LAB — IRON AND TIBC
IRON: 32 ug/dL (ref 28–170)
Saturation Ratios: 12 % (ref 10.4–31.8)
TIBC: 256 ug/dL (ref 250–450)
UIBC: 224 ug/dL

## 2017-12-13 LAB — CBC WITH DIFFERENTIAL/PLATELET
Basophils Absolute: 0 10*3/uL (ref 0.0–0.1)
Basophils Relative: 0 %
Eosinophils Absolute: 0.2 10*3/uL (ref 0.0–0.7)
Eosinophils Relative: 3 %
HEMATOCRIT: 29.9 % — AB (ref 36.0–46.0)
Hemoglobin: 9.8 g/dL — ABNORMAL LOW (ref 12.0–15.0)
LYMPHS ABS: 1.3 10*3/uL (ref 0.7–4.0)
LYMPHS PCT: 20 %
MCH: 31.3 pg (ref 26.0–34.0)
MCHC: 32.8 g/dL (ref 30.0–36.0)
MCV: 95.5 fL (ref 78.0–100.0)
MONOS PCT: 8 %
Monocytes Absolute: 0.6 10*3/uL (ref 0.1–1.0)
NEUTROS ABS: 4.6 10*3/uL (ref 1.7–7.7)
Neutrophils Relative %: 69 %
Platelets: 265 10*3/uL (ref 150–400)
RBC: 3.13 MIL/uL — ABNORMAL LOW (ref 3.87–5.11)
RDW: 16 % — ABNORMAL HIGH (ref 11.5–15.5)
WBC: 6.7 10*3/uL (ref 4.0–10.5)

## 2017-12-13 LAB — VITAMIN B12: VITAMIN B 12: 219 pg/mL (ref 180–914)

## 2017-12-13 LAB — FERRITIN: Ferritin: 85 ng/mL (ref 11–307)

## 2017-12-13 MED ORDER — ACETAMINOPHEN 325 MG PO TABS
650.0000 mg | ORAL_TABLET | Freq: Four times a day (QID) | ORAL | Status: DC | PRN
  Administered 2017-12-13 – 2017-12-16 (×2): 650 mg via ORAL
  Filled 2017-12-13 (×2): qty 2

## 2017-12-13 MED ORDER — BISACODYL 10 MG RE SUPP
10.0000 mg | Freq: Once | RECTAL | Status: DC
  Filled 2017-12-13: qty 1

## 2017-12-13 MED ORDER — DOCUSATE SODIUM 100 MG PO CAPS
100.0000 mg | ORAL_CAPSULE | Freq: Two times a day (BID) | ORAL | Status: DC
  Filled 2017-12-13: qty 1

## 2017-12-13 NOTE — Progress Notes (Signed)
NIF and VC performed. Pt had good effort NIF -18, VC 1.5

## 2017-12-13 NOTE — Evaluation (Signed)
Occupational Therapy Evaluation Patient Details Name: Teresa Callahan MRN: [id number] DOB: 1918-01-02 Today's Date: 12/13/2017    History of Present Illness 84 y.o. female with medical history significant for asthma, hypothyroidism, osteoporosis, GERD, carotid stenosis, ascending hip fracture presents to the emergency department with the chief complaint difficulty swallowing. Initial evaluation concerning for possible CVA or myasthenia gravis;  12/11/17 CXR indicated Mild cardiomegaly without edema or infiltrate. 2. Kyphotic deformity at the thoracolumbar junction with marked compression deformities; CT chest pending; CT head/neck 12/11/17 negative for possible vessel occlusion. Patient with recent history of fall/with multiple fractures and was at rehab facility until a approximately 10 days ago.   Clinical Impression   PTA, pt recently returned home from rehab s/p hip replacement and required varying levels of assist for basic mobility and ADL. Pt currently presents with decreased strength, balance deficits, increased hip pain, and dysphagia. Pt required mod assist for functional transfers and ambulation due to knee buckling, leg pain, and overall deconditioning and fatigue. Pt with loose bowel movements on OT arrival today and required max assist for pericare and dressing. Pt unable to tell when she needs to use the bathroom at this time. Pt plans to return home with her son who reported being able to provide 24/7 assistance and she is receiving HH aide and therapy services. Recommend continuation of these services. OT will continue to follow acutely to maximzie ppt's safety and performance with ADL and mobility prior to discharge.    Follow Up Recommendations  Home health OT;Supervision/Assistance - 24 hour    Equipment Recommendations  None recommended by OT    Recommendations for Other Services       Precautions / Restrictions Precautions Precautions: Fall Restrictions Weight Bearing  Restrictions: No      Mobility Bed Mobility Overal bed mobility: Needs Assistance Bed Mobility: Supine to Sit;Rolling Rolling: Min assist   Supine to sit: Min assist     General bed mobility comments: Assist to maintain safe sitting position and to scoot to EOB. Pt used HOB raised and bedrails  Transfers Overall transfer level: Needs assistance Equipment used: Rolling walker (2 wheeled) Transfers: Sit to/from Stand Sit to Stand: Mod assist         General transfer comment: Assist for boost to stand, to achieve safe static standing balance, and to turn with RW and control descent. Pt attempting to let go of RW and grab onto 3in1 arm to pivot and sit and required cues to use RW to achieve safe position in front of 3i1n before sitting down. Pt did same thing when coming toward the chair.    Balance Overall balance assessment: Needs assistance;History of Falls Sitting-balance support: No upper extremity supported;Feet unsupported Sitting balance-Leahy Scale: Good     Standing balance support: Bilateral upper extremity supported;During functional activity Standing balance-Leahy Scale: Poor Standing balance comment: Reliant on UE support to maintain static and dynamic balance plus min-mod physical assistance. Pt with posterior lean                           ADL either performed or assessed with clinical judgement   ADL Overall ADL's : Needs assistance/impaired Eating/Feeding: Set up;Sitting   Grooming: Moderate assistance;Standing;Cueing for safety   Upper Body Bathing: Minimal assistance;Sitting   Lower Body Bathing: Maximal assistance;Sit to/from stand   Upper Body Dressing : Minimal assistance;Sitting   Lower Body Dressing: Maximal assistance;Sit to/from stand   Toilet Transfer: Moderate assistance;Cueing for safety;Ambulation;BSC;RW Toilet  Transfer Details (indicate cue type and reason): Pt attempts to sit down on 3in1 before in a safe position to do so  with RW Toileting- Clothing Manipulation and Hygiene: Maximal assistance;Sit to/from stand       Functional mobility during ADLs: Moderate assistance;Rolling walker General ADL Comments: Pt with L knee buckling throughout session - son reports this has been happening since she returned from [place]. Pt also is very fatigued and having loose BMs and not eating well during this hospital stay.     Vision   Vision Assessment?: No apparent visual deficits     Perception     Praxis      Pertinent Vitals/Pain Pain Assessment: Faces Faces Pain Scale: Hurts little more Pain Location: Constipation Pain Descriptors / Indicators: (Constipation) Pain Intervention(s): Limited activity within patient's tolerance;Monitored during session;Repositioned     Hand Dominance Right   Extremity/Trunk Assessment Upper Extremity Assessment Upper Extremity Assessment: Generalized weakness   Lower Extremity Assessment Lower Extremity Assessment: Defer to PT evaluation   Cervical / Trunk Assessment Cervical / Trunk Assessment: Kyphotic   Communication Communication Communication: No difficulties   Cognition Arousal/Alertness: Awake/alert Behavior During Therapy: WFL for tasks assessed/performed Overall Cognitive Status: Within Functional Limits for tasks assessed                                     General Comments       Exercises     Shoulder Instructions      Home Living Family/patient expects to be discharged to:: Private residence Living Arrangements: Children Available Help at Discharge: Family;Available 24 hours/day Type of Home: House Home Access: Level entry     Home Layout: Two level Alternate Level Stairs-Number of Steps: chair lift   Bathroom Shower/Tub: Chief Strategy OfficerTub/shower unit   Bathroom Toilet: Standard     Home Equipment: Environmental consultantWalker - 2 wheels;Walker - 4 wheels;Transport chair;Shower seat   Additional Comments: Pt's son lives at the home and reports being  able to be there 24/7. He is also looking into getting additional homecare services      Prior Functioning/Environment Level of Independence: Needs assistance  Gait / Transfers Assistance Needed: Uses RW in the home with gait belt and physical assistance from son or HH aide ADL's / Homemaking Assistance Needed: HH aide assists with showers 2x/week; son assist with all other ADL and IADL   Comments: Currently receiving HH PT 3x/week        OT Problem List: Decreased strength;Decreased range of motion;Decreased activity tolerance;Impaired balance (sitting and/or standing);Decreased safety awareness;Decreased knowledge of use of DME or AE      OT Treatment/Interventions: Self-care/ADL training;Therapeutic exercise;DME and/or AE instruction;Therapeutic activities;Patient/family education;Balance training    OT Goals(Current goals can be found in the care plan section) Acute Rehab OT Goals Patient Stated Goal: to go home OT Goal Formulation: With patient Time For Goal Achievement: 12/27/17 Potential to Achieve Goals: Good ADL Goals Pt Will Perform Eating: with modified independence;sitting Pt Will Perform Grooming: sitting;with set-up Pt Will Perform Upper Body Bathing: with min assist;sitting Pt Will Perform Lower Body Bathing: with min assist;sit to/from stand Pt Will Perform Toileting - Clothing Manipulation and hygiene: with min assist;sit to/from stand Pt Will Perform Tub/Shower Transfer: Tub transfer;with min assist;shower seat;ambulating;rolling walker  OT Frequency: Min 3X/week   Barriers to D/C:            Co-evaluation  AM-PAC PT "6 Clicks" Daily Activity     Outcome Measure Help from another person eating meals?: A Little Help from another person taking care of personal grooming?: A Lot Help from another person toileting, which includes using toliet, bedpan, or urinal?: A Lot Help from another person bathing (including washing, rinsing, drying)?: A  Lot Help from another person to put on and taking off regular upper body clothing?: A Little Help from another person to put on and taking off regular lower body clothing?: A Lot 6 Click Score: 14   End of Session Equipment Utilized During Treatment: Gait belt;Rolling walker Nurse Communication: Mobility status;Other (comment)(Continued loose BM)  Activity Tolerance: Patient tolerated treatment well Patient left: in chair;with call bell/phone within reach;with chair alarm set;with family/visitor present  OT Visit Diagnosis: Unsteadiness on feet (R26.81);Other abnormalities of gait and mobility (R26.89);Muscle weakness (generalized) (M62.81);History of falling (Z91.81);Pain                Time: 1610-9604 OT Time Calculation (min): 58 min Charges:  OT General Charges $OT Visit: 1 Visit OT Evaluation $OT Eval Moderate Complexity: 1 Mod OT Treatments $Self Care/Home Management : 38-52 mins G-Codes:     Nils Pyle, MS, OTR/L 12/13/2017, 12:37 PM

## 2017-12-13 NOTE — Progress Notes (Addendum)
Neurology Progress Note   S:// No acute changes overnight. Received 1 dose of Mestinon.  Tolerated it well.    O:// Current vital signs: BP (!) 122/50 (BP Location: Right Arm)   Pulse 68   Temp 98.1 F (36.7 C) (Oral)   Resp 14   Ht 5' (1.524 m)   Wt 49 kg (108 lb 0.4 oz)   SpO2 97%   BMI 21.10 kg/m  Vital signs in last 24 hours: Temp:  [98 F (36.7 C)-98.7 F (37.1 C)] 98.1 F (36.7 C) (03/24 0800) Pulse Rate:  [61-70] 68 (03/24 0800) Resp:  [14-18] 14 (03/24 0800) BP: (115-132)/(50-62) 122/50 (03/24 0800) SpO2:  [94 %-97 %] 97 % (03/24 0800) Weight:  [44.9 kg (99 lb)-49 kg (108 lb 0.4 oz)] 49 kg (108 lb 0.4 oz) (03/24 0500) Patient is awake alert oriented x3 Her speech is slightly dysarthric and guttural sounding Naming repetition comprehension is intact Pupils are equal round reactive light, right eye slightly down with a disconjugate gaze compared to the left which is her normal baseline according to her.  Mild bilateral ptosis.  No diplopia.  Extraocular movements intact.  Facial sensation intact.  Face symmetric.  Auditory acuity mildly reduced bilaterally, palate does not elevate much shoulder shrug intact and tongue midline.  Oral cavity has tori  Motor exam symmetric 5/5 with some fatigability.  Neck flexors and extensors 5 out of 5 Sensory exam intact to light touch all over Coordination: Intact finger-nose-finger DTRs: Blunted all over   Medications  Current Facility-Administered Medications:  .  enoxaparin (LOVENOX) injection 30 mg, 30 mg, Subcutaneous, Q24H, Black, Karen M, NP, 30 mg at 12/12/17 1532 .  ondansetron (ZOFRAN) tablet 4 mg, 4 mg, Oral, Q6H PRN **OR** ondansetron (ZOFRAN) injection 4 mg, 4 mg, Intravenous, Q6H PRN, Black, Karen M, NP .  polyethylene glycol (MIRALAX / GLYCOLAX) packet 17 g, 17 g, Oral, Daily, Briant Cedar, MD, 17 g at 12/12/17 1347 .  pyridostigmine (MESTINON) tablet 30 mg, 30 mg, Oral, Q8H, Milon Dikes, MD, 30 mg at  12/13/17 1610 .  senna-docusate (Senokot-S) tablet 1 tablet, 1 tablet, Oral, QHS, Briant Cedar, MD, 1 tablet at 12/12/17 2226 Labs CBC    Component Value Date/Time   WBC 6.7 12/13/2017 0551   RBC 3.13 (L) 12/13/2017 0551   HGB 9.8 (L) 12/13/2017 0551   HCT 29.9 (L) 12/13/2017 0551   PLT 265 12/13/2017 0551   MCV 95.5 12/13/2017 0551   MCH 31.3 12/13/2017 0551   MCHC 32.8 12/13/2017 0551   RDW 16.0 (H) 12/13/2017 0551   LYMPHSABS 1.3 12/13/2017 0551   MONOABS 0.6 12/13/2017 0551   EOSABS 0.2 12/13/2017 0551   BASOSABS 0.0 12/13/2017 0551    CMP     Component Value Date/Time   NA 137 12/13/2017 0551   K 4.0 12/13/2017 0551   CL 103 12/13/2017 0551   CO2 26 12/13/2017 0551   GLUCOSE 92 12/13/2017 0551   BUN 17 12/13/2017 0551   CREATININE 0.57 12/13/2017 0551   CREATININE 0.88 06/28/2013 1349   CALCIUM 9.2 12/13/2017 0551   PROT 6.6 12/11/2017 1344   ALBUMIN 4.0 12/11/2017 1344   AST 22 12/11/2017 1344   ALT 11 (L) 12/11/2017 1344   ALKPHOS 82 12/11/2017 1344   BILITOT 0.9 12/11/2017 1344   GFRNONAA >60 12/13/2017 0551   GFRAA >60 12/13/2017 0551  I have independently reviewed imaging: MRI of the brain does not show any evidence of acute stroke.  [id number]  TSH test 3.3  Assessment:  84 year old woman with progressive generalized weakness, weight loss and difficulty swallowing with acute onset of dysphagia brought in as an acute code stroke. No acute stroke on imaging no localizing signs other than generalized weakness and fatigability along with history of weakness being worse at the end of the day.  Also found to have severe dysarthria dysphagia and on head and neck imaging while looking at the CTA of the head and neck found to have a fluid-filled esophagus with possible distal obstruction which is currently being worked out by the primary team. As far as her neurological condition goes, I think she does have myasthenia gravis given the fatigability on exam and  the history of neck weakness and fatigue that worsens as the day progresses. Her neck has been less than 20 but she has been having difficulty maintaining grip in the palatal torus in the oral lesions are contributing to it.  She has very good vital capacity.  Impression: -Dysphagia - Evaluate for myasthenia gravis -Posterior circulation stroke unlikely given the normal imaging findings.  Was in the differential because of bulbar symptoms.  Recs: -I had debated about starting Mestinon for myasthenia gravis given that she was already having more secretions in her mouth and difficulty swallowing but eventually  I did start her on Mestinon 30mg   every 8 hours last night. -She seems to be tolerating Mestinon really well and her weakness is better per history and objectively on exam. -She does complain of some constipation, which I do not think is related to the Mestinon.  Primary team to address. -She needs evaluation for the abnormal esophageal findings on the body scans.  Her imaging is negative for any acute malignancy. -Her myasthenia antibodies are pending at this time-need outpatient follow-up.  Neurology sign off recommendations: - Continue Mestinon 30 mg every 8 hours at least till she sees outpatient neurology. Side effects include diarrhea and increased secretions. -Outpatient follow-up with neurology -Follow-up on the myasthenia gravis antibodies which should be available sometime next week.  This should not hold up her discharge.  This can be followed up as outpatient.  Decision to continue Mestinon and also to initiate prednisone based on outpatient neurology follow-up. - At this time can discontinue NIF and FVCs -Although her B12 levels are normal, they are in the very lower range of normal.  Her B12 should be replenished to maintain a goal level of over 400. -PT OT speech therapy  -- Milon Dikes, MD [facility] Pager: [phone] If 7pm to 7am, please call on call as  listed on AMION.  Drugs to avoid if you have Myasthenia Gravis:   Many drugs have been reported to have adverse effects in patients with MG (see below). However, not all patients react adversely to all these drugs. Conversely, not all "safe" drugs can be used with impunity in patients with MG.As a rule, the listed drugs should be avoided whenever possible, and patients with MG should be followed closely when any new drug is introduced.  Drugs that may exacerbate MG  Antibiotics  Aminoglycosides: e.g., streptomycin, tobramycin, kanamycin  Quinolones: e.g., ciprofloxacin, levofloxacin, ofloxacin, gatifloxacin  Macrolides: e.g., erythromycin, azithromycin, telithromycin  Nondepolarizing muscle relaxants for surgery  d-Tubocurarine (curare), pancuronium, vecuronium, atracurium  Beta-blocking agents  Propranolol, atenolol, metoprolol  Local anesthetics and related agents  Procaine, Xylocaine in large amounts  Procainamide (for arrhythmias)  Botulinum toxin  Botox exacerbates weakness.  Quinine derivatives  Quinine, quinidine, chloroquine, mefloquine (Lariam)  Magnesium  Decreases ACh release  Penicillamine  May cause MG  Drugs with important interactions in MG  Cyclosporine  Broad range of drug interactions, which may raise or lower cyclosporine levels  Azathioprine  Avoid allopurinol; combination may result in myelosuppression.

## 2017-12-13 NOTE — Plan of Care (Signed)
Pt tx OOB to chair x1 assist, tol well x2hrs. Pt c/o HA, MD notified, Tylenol advised.

## 2017-12-13 NOTE — Progress Notes (Signed)
PROGRESS NOTE  Teresa Callahan NOB:[id number] DOB: 1918-05-12 DOA: 12/11/2017 PCP: Marton Redwood, MD  HPI/Recap of past 24 hours: Teresa Callahan is a very pleasant 84 y.o. female with medical history significant for asthma, hypothyroidism, osteoporosis, GERD, carotid stenosis, hip fracture presents to the ER c/o of difficulty with swallowing mainly with pills. Family also noted episodes of drooling, which has become worse.  Family also reported around me D patient becomes very tired and sleeps on the couch, noted and neck is weak while sleeping.  Significant unintentional weight loss.  Initial evaluation concerning for possible CVA, posterior circulation stroke.  Neurology was consulted, also had concerns for possible myasthenia gravis. [facility] are asked to admit.  On MRI no acute CVA found.  Patient noted to have fluid-filled/thickened esophagus on CT.  Today, patient denies any new complaints, feels the same overall.  Continues to have dysphasia.  Passed swallow eval today, okay to eat  12/13/2017-patient seen.  Patient is able to tolerate orally.  However, patient continues to have excessive salivation.  Unfortunately, we cannot use anticholinergic due to concerns for myasthenia gravis.  Patient has been started on Mestinon, and seems to be doing well.  We will continue to monitor patient overnight, and likely discharge patient tomorrow.  Patient will need to follow-up with neurology on discharge.  Assessment/Plan: Principal Problem:   Dysphagia Active Problems:   ANEMIA, IRON DEFICIENCY   Asthma, chronic   Hypothyroidism   Carotid stenosis   Closed left hip fracture (HCC)  Worsening dysphagia: Unclear etiology History as noted above CT abdomen showed thick-walled mid distal esophagus more likely an inflammatory process than infiltrating tumor ENT on board, no further recommendation SLP on board, passed swallow eval, recommend regular diet Esophagram pending Consider  consulting GI for possible endoscopy 12/13/2017: Patient passed swallowing evaluation.  Patient is tolerating oral feeds.  Patient is currently on Mestinon for possible myasthenia gravis.  Patient will follow with neurology on discharge.  Possible myasthenia gravis Presents with severe dysarthria, dysphasia, neck weakness Neurology on board suspecting myasthenia gravis MG antibodies pending ESR, CRP pending CT abdomen/pelvis/chest with no significant finding except above Neurology will hold off on starting Mestinon until workup complete and everything else excluded Continue NIFs and FVCs every shift per RT Aspiration precautions 12/13/2017: Please see above.  Stroke workup MRI brain without contrast negative for acute CVA PT/OT/SLP on board TSH, B12 pending 12/13/2017: B12 is low normal.  Will check Methylmalonic acid, and will start patient on B12.  Follow folate level.  Microcytic anemia Iron panel, B12, folate pending Daily CBC  Asthma Stable Continue inhalers    Code Status: Full  Family Communication: None at bedside  Disposition Plan: Likely DC in the morning.   Consultants:  Neurology  ENT  Procedures:  None  Antimicrobials:  None  DVT prophylaxis: Enoxaparin   Objective: Vitals:   12/13/17 0341 12/13/17 0500 12/13/17 0700 12/13/17 0800  BP: (!) 132/56   (!) 122/50  Pulse: 70  61 68  Resp: '18  15 14  '$ Temp: 98.7 F (37.1 C)   98.1 F (36.7 C)  TempSrc: Oral   Oral  SpO2: 94%  96% 97%  Weight:  49 kg (108 lb 0.4 oz)    Height:        Intake/Output Summary (Last 24 hours) at 12/13/2017 1018 Last data filed at 12/12/2017 2004 Gross per 24 hour  Intake -  Output 200 ml  Net -200 ml   [employer]  12/11/17 1707 12/12/17 0500 12/13/17 0500  Weight: 45.3 kg (99 lb 13.9 oz) 45.2 kg (99 lb 10.4 oz) 49 kg (108 lb 0.4 oz)    Exam:   General: NAD.  Excessive salivation noted.  Cardiovascular: S1, S2 present  Respiratory:  CTAB  Abdomen: Soft, nontender, nondistended, bowel sounds present  Musculoskeletal: No pedal edema bilaterally  Skin: Multiple bruises noted on both upper and lower extremities  Psychiatry: Normal mood  Neuro: 5/5 strength in all extremities.   Data Reviewed: CBC: Recent Labs  Lab 12/11/17 1344 12/11/17 1350 12/12/17 0401 12/13/17 0551  WBC 9.8  --  6.4 6.7  NEUTROABS 8.3*  --   --  4.6  HGB 10.7* 11.9* 9.1* 9.8*  HCT 32.4* 35.0* 27.0* 29.9*  MCV 94.7  --  96.4 95.5  PLT 298  --  260 086   Basic Metabolic Panel: Recent Labs  Lab 12/11/17 1344 12/11/17 1350 12/12/17 0401 12/13/17 0551  NA 133* 135 135 137  K 4.5 4.6 4.2 4.0  CL 100* 101 105 103  CO2 24  --  22 26  GLUCOSE 116* 112* 84 92  BUN 35* 34* 23* 17  CREATININE 0.70 0.60 0.57 0.57  CALCIUM 9.4  --  8.4* 9.2   GFR: Estimated Creatinine Clearance: 28.9 mL/min (by C-G formula based on SCr of 0.57 mg/dL). Liver Function Tests: Recent Labs  Lab 12/11/17 1344  AST 22  ALT 11*  ALKPHOS 82  BILITOT 0.9  PROT 6.6  ALBUMIN 4.0   No results for input(s): LIPASE, AMYLASE in the last 168 hours. No results for input(s): AMMONIA in the last 168 hours. Coagulation Profile: Recent Labs  Lab 12/11/17 1344  INR 0.95   Cardiac Enzymes: No results for input(s): CKTOTAL, CKMB, CKMBINDEX, TROPONINI in the last 168 hours. BNP (last 3 results) No results for input(s): PROBNP in the last 8760 hours. HbA1C: No results for input(s): HGBA1C in the last 72 hours. CBG: Recent Labs  Lab 12/11/17 1350 12/11/17 2328  GLUCAP 106* 85   Lipid Profile: No results for input(s): CHOL, HDL, LDLCALC, TRIG, CHOLHDL, LDLDIRECT in the last 72 hours. Thyroid Function Tests: Recent Labs    12/13/17 0551  TSH 3.332   Anemia Panel: Recent Labs    12/13/17 0551  VITAMINB12 219  FERRITIN 85  TIBC 256  IRON 32   Urine analysis:    Component Value Date/Time   COLORURINE YELLOW 05/10/2014 0818   APPEARANCEUR CLEAR  05/10/2014 0818   LABSPEC 1.009 05/10/2014 0818   PHURINE 7.0 05/10/2014 0818   GLUCOSEU NEGATIVE 05/10/2014 0818   HGBUR TRACE (A) 05/10/2014 0818   BILIRUBINUR NEGATIVE 05/10/2014 0818   KETONESUR NEGATIVE 05/10/2014 0818   PROTEINUR 30 (A) 05/10/2014 0818   UROBILINOGEN 0.2 05/10/2014 0818   NITRITE NEGATIVE 05/10/2014 0818   LEUKOCYTESUR TRACE (A) 05/10/2014 0818   Sepsis Labs: '@LABRCNTIP'$ (procalcitonin:4,lacticidven:4)  )No results found for this or any previous visit (from the past 240 hour(s)).    Studies: Ct Chest W Contrast  Result Date: 12/12/2017 CLINICAL DATA:  Unintentional weight loss.  Dilated esophagus. EXAM: CT CHEST, ABDOMEN, AND PELVIS WITH CONTRAST TECHNIQUE: Multidetector CT imaging of the chest, abdomen and pelvis was performed following the standard protocol during bolus administration of intravenous contrast. CONTRAST:  47m ISOVUE-300 IOPAMIDOL (ISOVUE-300) INJECTION 61% COMPARISON:  None. FINDINGS: CT CHEST FINDINGS Cardiovascular: The heart is normal in size. No pericardial effusion. Advanced atherosclerotic calcifications involving the thoracic aorta but no aneurysm or dissection. Significant stenosis of  the left subclavian artery is noted. Coronary artery calcifications noted. Mediastinum/Nodes: Scattered sub 8 mm mediastinal and hilar lymph nodes. Calcified right hilar lymph nodes are noted. Thick walled mid distal esophagus with suspected edema. Could not exclude infiltrating tumor but esophagitis is also possible. Recommend endoscopy or esophagram. Lungs/Pleura: Emphysematous changes but no acute pulmonary findings such as infiltrate or edema. No worrisome pulmonary lesions or pulmonary nodules. Calcified granulomas are noted in the left lower lobe with adjacent calcified left hilar lymph nodes. Mild pleural thickening at the left lung base and scarring. Musculoskeletal: No acute bony findings or worrisome bone lesions. Remote posttraumatic changes involving the  lower thoracic spine with kyphotic deformity. CT ABDOMEN PELVIS FINDINGS Hepatobiliary: Calcified granulomas are scattered throughout the liver. No worrisome hepatic lesions or intrahepatic biliary dilatation. The gallbladder demonstrates some layering high attenuation material which could be stones or sludge. No common bile duct dilatation. Pancreas: No mass, inflammation or ductal dilatation. Spleen: Calcified granulomas but no enlargement or mass. Adrenals/Urinary Tract: The adrenal glands and kidneys are unremarkable. Renal cortical thinning and scarring but no mass or hydronephrosis. The bladder is unremarkable. Stomach/Bowel: The stomach demonstrates mild diffuse wall thickening and possible edema. Diffuse gastritis is a possibility. The duodenum, small bowel and colon are grossly normal. No acute inflammatory process, mass lesions or obstructive findings. Severe sigmoid diverticulosis without findings for acute diverticulitis. Large amount of stool in the rectum suggesting fecal impaction. The terminal ileum is unremarkable. Vascular/Lymphatic: Severe atherosclerotic calcifications involving the abdominal aorta and branch vessel ostia. No focal aneurysm or dissection. No mesenteric or retroperitoneal mass or adenopathy. Reproductive: Surgically absent. Other: No pelvic mass or free pelvic fluid collections. No inguinal adenopathy or hernia. Musculoskeletal: Remote posttraumatic changes involving the pelvis and left hip. No acute bony findings or worrisome bone lesions. IMPRESSION: 1. Thick-walled mid distal esophagus more likely an inflammatory process than infiltrating tumor. Similar findings involving the stomach suggesting gastritis. Endoscopy may be helpful for further evaluation. No obvious ulcer. 2. No worrisome pulmonary lesions or acute pulmonary findings. Calcified granulomas noted in the left lower lobe along with calcified left hilar lymph nodes. 3. No abdominal/pelvic mass or lymphadenopathy.  Electronically Signed   By: Marijo Sanes M.D.   On: 12/12/2017 12:06   Mr Brain Wo Contrast  Result Date: 12/12/2017 CLINICAL DATA:  Focal neuro deficit, greater than 6 hours, stroke suspected. Choking episode yesterday. EXAM: MRI HEAD WITHOUT CONTRAST TECHNIQUE: Multiplanar, multiecho pulse sequences of the brain and surrounding structures were obtained without intravenous contrast. COMPARISON:  CT head and CTA head and neck 12/11/2017. MRI brain 05/10/2014. FINDINGS: Brain: Advanced atrophy and white matter disease is similar the prior exam. Ventricles are proportionate to the degree of atrophy. No significant extra-axial fluid collection is present. Brainstem and cerebellum are normal. Cerebellar atrophy is stable. The internal auditory canals are within normal limits bilaterally. Vascular: Abnormal signal is compatible with chronic right internal carotid artery occlusion. The artery is reconstituted at the level of the right posterior communicating artery. There is flow in the left internal carotid artery and both vertebral arteries. Basilar artery is patent. Skull and upper cervical spine: The skull base is within normal limits. The craniocervical junction is normal. Sinuses/Orbits: The paranasal sinuses are clear. There is some fluid in the mastoid air cells bilaterally. No obstructing nasopharyngeal lesion is evident. Bilateral lens replacements are present. Globes and orbits are otherwise within normal limits. IMPRESSION: 1. No acute intracranial abnormality or significant interval change. 2. Stable atrophy and white matter  disease. 3. Chronic right ICA occlusion. Electronically Signed   By: San Morelle M.D.   On: 12/12/2017 12:06   Ct Abdomen Pelvis W Contrast  Result Date: 12/12/2017 CLINICAL DATA:  Unintentional weight loss.  Dilated esophagus. EXAM: CT CHEST, ABDOMEN, AND PELVIS WITH CONTRAST TECHNIQUE: Multidetector CT imaging of the chest, abdomen and pelvis was performed following  the standard protocol during bolus administration of intravenous contrast. CONTRAST:  40m ISOVUE-300 IOPAMIDOL (ISOVUE-300) INJECTION 61% COMPARISON:  None. FINDINGS: CT CHEST FINDINGS Cardiovascular: The heart is normal in size. No pericardial effusion. Advanced atherosclerotic calcifications involving the thoracic aorta but no aneurysm or dissection. Significant stenosis of the left subclavian artery is noted. Coronary artery calcifications noted. Mediastinum/Nodes: Scattered sub 8 mm mediastinal and hilar lymph nodes. Calcified right hilar lymph nodes are noted. Thick walled mid distal esophagus with suspected edema. Could not exclude infiltrating tumor but esophagitis is also possible. Recommend endoscopy or esophagram. Lungs/Pleura: Emphysematous changes but no acute pulmonary findings such as infiltrate or edema. No worrisome pulmonary lesions or pulmonary nodules. Calcified granulomas are noted in the left lower lobe with adjacent calcified left hilar lymph nodes. Mild pleural thickening at the left lung base and scarring. Musculoskeletal: No acute bony findings or worrisome bone lesions. Remote posttraumatic changes involving the lower thoracic spine with kyphotic deformity. CT ABDOMEN PELVIS FINDINGS Hepatobiliary: Calcified granulomas are scattered throughout the liver. No worrisome hepatic lesions or intrahepatic biliary dilatation. The gallbladder demonstrates some layering high attenuation material which could be stones or sludge. No common bile duct dilatation. Pancreas: No mass, inflammation or ductal dilatation. Spleen: Calcified granulomas but no enlargement or mass. Adrenals/Urinary Tract: The adrenal glands and kidneys are unremarkable. Renal cortical thinning and scarring but no mass or hydronephrosis. The bladder is unremarkable. Stomach/Bowel: The stomach demonstrates mild diffuse wall thickening and possible edema. Diffuse gastritis is a possibility. The duodenum, small bowel and colon are  grossly normal. No acute inflammatory process, mass lesions or obstructive findings. Severe sigmoid diverticulosis without findings for acute diverticulitis. Large amount of stool in the rectum suggesting fecal impaction. The terminal ileum is unremarkable. Vascular/Lymphatic: Severe atherosclerotic calcifications involving the abdominal aorta and branch vessel ostia. No focal aneurysm or dissection. No mesenteric or retroperitoneal mass or adenopathy. Reproductive: Surgically absent. Other: No pelvic mass or free pelvic fluid collections. No inguinal adenopathy or hernia. Musculoskeletal: Remote posttraumatic changes involving the pelvis and left hip. No acute bony findings or worrisome bone lesions. IMPRESSION: 1. Thick-walled mid distal esophagus more likely an inflammatory process than infiltrating tumor. Similar findings involving the stomach suggesting gastritis. Endoscopy may be helpful for further evaluation. No obvious ulcer. 2. No worrisome pulmonary lesions or acute pulmonary findings. Calcified granulomas noted in the left lower lobe along with calcified left hilar lymph nodes. 3. No abdominal/pelvic mass or lymphadenopathy. Electronically Signed   By: PMarijo SanesM.D.   On: 12/12/2017 12:06    Scheduled Meds: . bisacodyl  10 mg Rectal Once  . docusate sodium  100 mg Oral BID  . enoxaparin  30 mg Subcutaneous Q24H  . polyethylene glycol  17 g Oral Daily  . pyridostigmine  30 mg Oral Q8H  . senna-docusate  1 tablet Oral QHS    Continuous Infusions:   LOS: 2 days     SBonnell Public MD [phone](pager number) [facility]   If 7PM-7AM, please contact night-coverage [email or web address] Password [email or web address]3/24/2019, 10:18 AM

## 2017-12-13 NOTE — Progress Notes (Signed)
NIF and VC performed. Pt had good effort. NIF -16 and VC .70

## 2017-12-14 LAB — CBC WITH DIFFERENTIAL/PLATELET
Basophils Absolute: 0 10*3/uL (ref 0.0–0.1)
Basophils Relative: 0 %
Eosinophils Absolute: 0.2 10*3/uL (ref 0.0–0.7)
Eosinophils Relative: 4 %
HCT: 28.2 % — ABNORMAL LOW (ref 36.0–46.0)
Hemoglobin: 9.4 g/dL — ABNORMAL LOW (ref 12.0–15.0)
LYMPHS ABS: 1.4 10*3/uL (ref 0.7–4.0)
LYMPHS PCT: 24 %
MCH: 32.1 pg (ref 26.0–34.0)
MCHC: 33.3 g/dL (ref 30.0–36.0)
MCV: 96.2 fL (ref 78.0–100.0)
MONO ABS: 0.6 10*3/uL (ref 0.1–1.0)
Monocytes Relative: 10 %
Neutro Abs: 3.7 10*3/uL (ref 1.7–7.7)
Neutrophils Relative %: 62 %
Platelets: 263 10*3/uL (ref 150–400)
RBC: 2.93 MIL/uL — ABNORMAL LOW (ref 3.87–5.11)
RDW: 16.2 % — AB (ref 11.5–15.5)
WBC: 5.9 10*3/uL (ref 4.0–10.5)

## 2017-12-14 LAB — BASIC METABOLIC PANEL
Anion gap: 9 (ref 5–15)
BUN: 18 mg/dL (ref 6–20)
CALCIUM: 8.9 mg/dL (ref 8.9–10.3)
CO2: 26 mmol/L (ref 22–32)
CREATININE: 0.61 mg/dL (ref 0.44–1.00)
Chloride: 98 mmol/L — ABNORMAL LOW (ref 101–111)
GFR calc Af Amer: >60 mL/min (ref >60)
GLUCOSE: 84 mg/dL (ref 65–99)
Potassium: 4.2 mmol/L (ref 3.5–5.1)
Sodium: 133 mmol/L — ABNORMAL LOW (ref 135–145)

## 2017-12-14 MED ORDER — PANTOPRAZOLE SODIUM 40 MG PO TBEC
40.0000 mg | DELAYED_RELEASE_TABLET | Freq: Every day | ORAL | Status: DC
  Administered 2017-12-14 – 2017-12-17 (×3): 40 mg via ORAL
  Filled 2017-12-14 (×4): qty 1

## 2017-12-14 NOTE — Progress Notes (Signed)
NIF - 22 VC 1.07L  With good effort.

## 2017-12-14 NOTE — Care Management Note (Addendum)
Case Management Note  Patient Details  Name: Caralyn GuileMargery O Lyall MRN: [id number] Date of Birth: [birth year]  Subjective/Objective:                    Action/Plan: Plan is for patient to d/[facility] home [facility] son [facility] (this was arranged prior to admission)  [facility] She is to speak to family again today. Family seems a little confused on hospice and HH. CM following.  Expected Discharge Date:  (unknown)               Expected Discharge Plan:  Home w Hospice Care  In-House Referral:     Discharge planning Services  CM Consult  Post Acute Care Choice:  Hospice Choice offered to:     DME Arranged:    DME Agency:     HH Arranged:    HH [facility]  Status of Service:  In process, will continue to follow  If discussed at Long Length of Stay Meetings, dates discussed:    Additional Comments:  Kermit BaloKelli F Kensli Bowley, RN 12/14/2017, 12:58 PM

## 2017-12-14 NOTE — Progress Notes (Signed)
NIF -22 Good pt effort

## 2017-12-14 NOTE — Progress Notes (Signed)
PROGRESS NOTE  Teresa Callahan NIO:[id number] DOB: March 04, 1918 DOA: 12/11/2017 PCP: Marton Redwood, MD  HPI/Recap of past 24 hours: Teresa Callahan is a very pleasant 84 y.o. female with medical history significant for asthma, hypothyroidism, osteoporosis, GERD, carotid stenosis, hip fracture presents to the ER c/o of difficulty with swallowing mainly with pills. Family also noted episodes of drooling, which has become worse.  Family also reported around me D patient becomes very tired and sleeps on the couch, noted and neck is weak while sleeping.  Significant unintentional weight loss.  Initial evaluation concerning for possible CVA, posterior circulation stroke.  Neurology was consulted, also had concerns for possible myasthenia gravis. [facility] are asked to admit.  On MRI no acute CVA found.  Patient noted to have fluid-filled/thickened esophagus on CT.  Today, patient denies any new complaints, feels the same overall.  Continues to have dysphasia.  Passed swallow eval today, okay to eat  12/14/2017-patient seen alongside patient's daughter and nurse.  Updated the patient's daughter extensively.  Encourage patient the results also discussed with neurology team.  Will get physical therapy and Occupational Therapy consult.  Will start patient on Protonix.  I have consulted GI team as per patient's family request.  [facility] called.patient continues to tolerate orally without difficulties.  Patient remains on Mestinon without any reported side effect, specifically, no diarrhea.  Patient was actually more concerned about constipation.  Patient's family seems to focus on different that the neurology team informed them that Mestinon could cause diarrhea.  Otherwise, no new complaints.  Patient has remained stable.  Patient's daughter is now comfortable with patient being discharged back on today.   Assessment/Plan: Principal Problem:   Dysphagia Active Problems:   ANEMIA, IRON  DEFICIENCY   Asthma, chronic   Hypothyroidism   Carotid stenosis   Closed left hip fracture (HCC)  Worsening dysphagia: Unclear etiology History as noted above CT abdomen showed thick-walled mid distal esophagus more likely an inflammatory process than infiltrating tumor ENT on board, no further recommendation SLP on board, passed swallow eval, recommend regular diet 12/14/2017: Patient continues to tolerate oral feeds without difficulties.  Speech team input is highly appreciated.  GI team called as per patient's family request.   Possible myasthenia gravis Presents with severe dysarthria, dysphasia, neck weakness Neurology on board suspecting myasthenia gravis MG antibodies pending ESR, CRP pending CT abdomen/pelvis/chest with no significant finding except above Neurology will hold off on starting Mestinon until workup complete and everything else excluded Continue NIFs and FVCs every shift per RT Aspiration precautions 12/14/2017: Please see above.  Stroke workup MRI brain without contrast negative for acute CVA PT/OT/SLP on board TSH, B12 pending 12/13/2017: B12 is low normal.  Will check Methylmalonic acid, and will start patient on B12.  Follow folate level.  Microcytic anemia Iron panel, B12, folate pending Daily CBC  Asthma Stable Continue inhalers    Code Status: Full  Family Communication: None at bedside  Disposition Plan: Likely DC in the morning.   Consultants:  Neurology  ENT  Procedures:  None  Antimicrobials:  None  DVT prophylaxis: Enoxaparin   Objective: Vitals:   12/14/17 0916 12/14/17 1212 12/14/17 1622 12/14/17 2000  BP: (!) 107/42 (!) 107/42 (!) 137/54 (!) 132/53  Pulse: 64 79 71 66  Resp: _0 (!) 21  Temp:  98.6 F (37 C) 98.7 F (37.1 C) 98.7 F (37.1 C)  TempSrc:  Oral Oral Axillary  SpO2: 99% 97% 100% 99%  Weight:      Height:       No intake or output data in the 24 hours ending 12/14/17 2243 Filed  Weights   12/13/17 0500 12/14/17 0440 12/14/17 0731  Weight: 49 kg (108 lb 0.4 oz) 46.8 kg (103 lb 2.8 oz) 45.1 kg (99 lb 6.8 oz)    Exam:   General: NAD.  Excessive salivation noted.  Cardiovascular: S1, S2 present  Respiratory: CTAB  Abdomen: Soft, nontender, nondistended, bowel sounds present  Musculoskeletal: No pedal edema bilaterally  Skin: Multiple bruises noted on both upper and lower extremities  Psychiatry: Normal mood  Neuro: 5/5 strength in all extremities.   Data Reviewed: CBC: Recent Labs  Lab 12/11/17 1344 12/11/17 1350 12/12/17 0401 12/13/17 0551 12/14/17 0328  WBC 9.8  --  6.4 6.7 5.9  NEUTROABS 8.3*  --   --  4.6 3.7  HGB 10.7* 11.9* 9.1* 9.8* 9.4*  HCT 32.4* 35.0* 27.0* 29.9* 28.2*  MCV 94.7  --  96.4 95.5 96.2  PLT 298  --  260 265 932   Basic Metabolic Panel: Recent Labs  Lab 12/11/17 1344 12/11/17 1350 12/12/17 0401 12/13/17 0551 12/14/17 0328  NA 133* 135 135 137 133*  K 4.5 4.6 4.2 4.0 4.2  CL 100* 101 105 103 98*  CO2 24  --  _0 GLUCOSE 116* 112* 84 92 84  BUN 35* 34* 23* 17 18  CREATININE 0.70 0.60 0.57 0.57 0.61  CALCIUM 9.4  --  8.4* 9.2 8.9   GFR: Estimated Creatinine Clearance: 28.6 mL/min (by C-G formula based on SCr of 0.61 mg/dL). Liver Function Tests: Recent Labs  Lab 12/11/17 1344  AST 22  ALT 11*  ALKPHOS 82  BILITOT 0.9  PROT 6.6  ALBUMIN 4.0   No results for input(s): LIPASE, AMYLASE in the last 168 hours. No results for input(s): AMMONIA in the last 168 hours. Coagulation Profile: Recent Labs  Lab 12/11/17 1344  INR 0.95   Cardiac Enzymes: No results for input(s): CKTOTAL, CKMB, CKMBINDEX, TROPONINI in the last 168 hours. BNP (last 3 results) No results for input(s): PROBNP in the last 8760 hours. HbA1C: No results for input(s): HGBA1C in the last 72 hours. CBG: Recent Labs  Lab 12/11/17 1350 12/11/17 2328  GLUCAP 106* 85   Lipid Profile: No results for input(s): CHOL, HDL,  LDLCALC, TRIG, CHOLHDL, LDLDIRECT in the last 72 hours. Thyroid Function Tests: Recent Labs    12/13/17 0551  TSH 3.332   Anemia Panel: Recent Labs    12/13/17 0551  VITAMINB12 219  FERRITIN 85  TIBC 256  IRON 32   Urine analysis:    Component Value Date/Time   COLORURINE YELLOW 05/10/2014 0818   APPEARANCEUR CLEAR 05/10/2014 0818   LABSPEC 1.009 05/10/2014 0818   PHURINE 7.0 05/10/2014 0818   GLUCOSEU NEGATIVE 05/10/2014 0818   HGBUR TRACE (A) 05/10/2014 0818   BILIRUBINUR NEGATIVE 05/10/2014 0818   KETONESUR NEGATIVE 05/10/2014 0818   PROTEINUR 30 (A) 05/10/2014 0818   UROBILINOGEN 0.2 05/10/2014 0818   NITRITE NEGATIVE 05/10/2014 0818   LEUKOCYTESUR TRACE (A) 05/10/2014 0818   Sepsis Labs: _1 (procalcitonin:4,lacticidven:4)  )No results found for this or any previous visit (from the past 240 hour(s)).    Studies: No results found.  Scheduled Meds: . enoxaparin  30 mg Subcutaneous Q24H  . pantoprazole  40 mg Oral Daily  . pyridostigmine  30 mg Oral Q8H  . senna-docusate  1 tablet Oral QHS  Continuous Infusions:   LOS: 3 days     Bonnell Public, MD [phone] (pager number) [facility]   If 7PM-7AM, please contact night-coverage [email or web address] Password [facility] 12/14/2017, 10:43 PM

## 2017-12-14 NOTE — Progress Notes (Signed)
  Speech Language Pathology Treatment: Dysphagia  Patient Details Name: MARVEEN DONLON MRN: [id number] DOB: 11-07-1917 Today's Date: 12/14/2017 Time: 1100-1109 SLP Time Calculation (min) (ACUTE ONLY): 9 min  Assessment / Plan / Recommendation Clinical Impression  Patient's daughter in room. Observed patient with crackers and water. Pt tolerated all consistencies with no overt signs of aspiration. Nurse communicated patient has tolerated meals with no problems as well. Patient and daughter were educated on aspiration precautions as well of signs of aspiration.  Patient has met all goals and planned for discharge from speech therapy.    HPI HPI: 84 y.o. female with medical history significant for asthma, hypothyroidism, osteoporosis, GERD, carotid stenosis, ascending hip fracture presents to the emergency department with the chief complaint difficulty swallowing. Initial evaluation concerning for possible CVA for myasthenia gravis.      SLP Plan  All goals met       Recommendations  Diet recommendations: Regular;Thin liquid Liquids provided via: Straw;Cup Medication Administration: Whole meds with liquid Supervision: Patient able to self feed                Oral Care Recommendations: Oral care BID SLP Visit Diagnosis: Dysphagia, unspecified (R13.10) Plan: All goals met       GO                Charlynne Cousins Latona Krichbaum 12/14/2017, 11:11 AM

## 2017-12-14 NOTE — Evaluation (Signed)
Physical Therapy Evaluation Patient Details Name: Teresa Callahan MRN: [id number] DOB: 1918/04/18 Today's Date: 12/14/2017   History of Present Illness  84 y.o. female with medical history significant for asthma, hypothyroidism, osteoporosis, GERD, carotid stenosis, ascending hip fracture presents to the emergency department with the chief complaint difficulty swallowing. Initial evaluation concerning for possible CVA or myasthenia gravis;  12/11/17 CXR indicated Mild cardiomegaly without edema or infiltrate. 2. Kyphotic deformity at the thoracolumbar junction with marked compression deformities; CT chest pending; CT head/neck 12/11/17 negative for possible vessel occlusion. Patient with recent history of fall/with multiple fractures and was at rehab facility until a approximately 10 days ago.  Clinical Impression  Pt remains incontinent of urine and stool and dependent for hygiene and ADLs.Pt did improve ambulation tolerance however required modA to maintain upright posture due to strong L lateral lean. Acute PT to con't to follow.    Follow Up Recommendations Home health PT;Supervision/Assistance - 24 hour    Equipment Recommendations  None recommended by PT    Recommendations for Other Services       Precautions / Restrictions Precautions Precautions: Fall Restrictions Weight Bearing Restrictions: No      Mobility  Bed Mobility Overal bed mobility: Needs Assistance Bed Mobility: Rolling;Sidelying to Sit Rolling: Min assist Sidelying to sit: Min assist       General bed mobility comments: pt able to roll on own however minA to complete for hygiene s/p urinary and stool incontinence. minA for trunk elevation to sit EOB  Transfers Overall transfer level: Needs assistance Equipment used: Rolling walker (2 wheeled) Transfers: Sit to/from [employer]Stand;Stand Pivot Transfers Sit to Stand: Min assist;Mod assist Stand pivot transfers: Max assist(without RW)       General transfer  comment: min/modA to power up, maxA for sequencing std pvt without RW. pt does better with sit to stand with RW  Ambulation/Gait Ambulation/Gait assistance: Mod assist;Max assist Ambulation Distance (Feet): 40 Feet Assistive device: Rolling walker (2 wheeled) Gait Pattern/deviations: Step-to pattern;Decreased step length - left;Decreased stance time - left;Antalgic;Narrow base of support Gait velocity: dec Gait velocity interpretation: Below normal speed for age/gender General Gait Details: pt with strong lean to the Left despite verbal and tactile cues to maintian midline.   Stairs            Wheelchair Mobility    Modified Rankin (Stroke Patients Only) Modified Rankin (Stroke Patients Only) Pre-Morbid Rankin Score: Moderate disability Modified Rankin: Moderately severe disability     Balance Overall balance assessment: Needs assistance;History of Falls Sitting-balance support: No upper extremity supported;Feet unsupported Sitting balance-Leahy Scale: Good     Standing balance support: Bilateral upper extremity supported;During functional activity Standing balance-Leahy Scale: Poor Standing balance comment: Reliant on UE support to maintain static and dynamic balance plus min-mod physical assistance. Pt with posterior lean                             Pertinent Vitals/Pain Pain Assessment: No/denies pain Faces Pain Scale: No hurt    Home Living                        Prior Function                 Hand Dominance        Extremity/Trunk Assessment                Communication      Cognition Arousal/Alertness:  Awake/alert Behavior During Therapy: WFL for tasks assessed/performed Overall Cognitive Status: Within Functional Limits for tasks assessed                                        General Comments General comments (skin integrity, edema, etc.): pt dependent for hygiene s/p urinary/stool incontinence     Exercises     Assessment/Plan    PT Assessment    PT Problem List         PT Treatment Interventions      PT Goals (Current goals can be found in the Care Plan section)  Acute Rehab PT Goals Patient Stated Goal: go home    Frequency Min 3X/week   Barriers to discharge        Co-evaluation               AM-PAC PT "6 Clicks" Daily Activity  Outcome Measure Difficulty turning over in bed (including adjusting bedclothes, sheets and blankets)?: Unable Difficulty moving from lying on back to sitting on the side of the bed? : Unable Difficulty sitting down on and standing up from a chair with arms (e.g., wheelchair, bedside commode, etc,.)?: Unable Help needed moving to and from a bed to chair (including a wheelchair)?: A Lot Help needed walking in hospital room?: A Lot Help needed climbing 3-5 steps with a railing? : A Lot 6 Click Score: 9    End of Session Equipment Utilized During Treatment: Gait belt Activity Tolerance: Patient limited by fatigue Patient left: in chair;with call bell/phone within reach;with chair alarm set;with nursing/sitter in room Nurse Communication: Mobility status PT Visit Diagnosis: Unsteadiness on feet (R26.81);History of falling (Z91.81);Muscle weakness (generalized) (M62.81)    Time: 9604-5409 PT Time Calculation (min) (ACUTE ONLY): 33 min   Charges:     PT Treatments $Gait Training: 8-22 mins $Therapeutic Activity: 8-22 mins   PT G Codes:        Teresa Callahan, PT, DPT Pager #: [phone] Office #: [phone]   Teresa Callahan 12/14/2017, 1:00 PM

## 2017-12-15 ENCOUNTER — Inpatient Hospital Stay (HOSPITAL_COMMUNITY): Payer: Medicare Other

## 2017-12-15 DIAGNOSIS — N390 Urinary tract infection, site not specified: Secondary | ICD-10-CM

## 2017-12-15 LAB — CBC WITH DIFFERENTIAL/PLATELET
BASOS PCT: 0 %
Basophils Absolute: 0 10*3/uL (ref 0.0–0.1)
EOS ABS: 0.4 10*3/uL (ref 0.0–0.7)
Eosinophils Relative: 6 %
HEMATOCRIT: 28.6 % — AB (ref 36.0–46.0)
HEMOGLOBIN: 9.4 g/dL — AB (ref 12.0–15.0)
Lymphocytes Relative: 26 %
Lymphs Abs: 1.6 10*3/uL (ref 0.7–4.0)
MCH: 30.6 pg (ref 26.0–34.0)
MCHC: 32.9 g/dL (ref 30.0–36.0)
MCV: 93.2 fL (ref 78.0–100.0)
Monocytes Absolute: 0.7 10*3/uL (ref 0.1–1.0)
Monocytes Relative: 11 %
NEUTROS ABS: 3.5 10*3/uL (ref 1.7–7.7)
NEUTROS PCT: 57 %
Platelets: 283 10*3/uL (ref 150–400)
RBC: 3.07 MIL/uL — AB (ref 3.87–5.11)
RDW: 15.9 % — ABNORMAL HIGH (ref 11.5–15.5)
WBC: 6.1 10*3/uL (ref 4.0–10.5)

## 2017-12-15 LAB — FOLATE RBC
FOLATE, RBC: 1167 ng/mL (ref >498)
Folate, Hemolysate: 339.6 ng/mL
Hematocrit: 29.1 % — ABNORMAL LOW (ref 34.0–46.6)

## 2017-12-15 LAB — BASIC METABOLIC PANEL
Anion gap: 6 (ref 5–15)
BUN: 10 mg/dL (ref 6–20)
CHLORIDE: 102 mmol/L (ref 101–111)
CO2: 25 mmol/L (ref 22–32)
Calcium: 8.9 mg/dL (ref 8.9–10.3)
Creatinine, Ser: 0.54 mg/dL (ref 0.44–1.00)
GFR calc non Af Amer: >60 mL/min (ref >60)
Glucose, Bld: 85 mg/dL (ref 65–99)
POTASSIUM: 4.4 mmol/L (ref 3.5–5.1)
SODIUM: 133 mmol/L — AB (ref 135–145)

## 2017-12-15 LAB — URINALYSIS, ROUTINE W REFLEX MICROSCOPIC
Bilirubin Urine: NEGATIVE
Glucose, UA: NEGATIVE mg/dL
Ketones, ur: NEGATIVE mg/dL
Nitrite: NEGATIVE
Protein, ur: NEGATIVE mg/dL
Specific Gravity, Urine: 1.008 (ref 1.005–1.030)
pH: 8 (ref 5.0–8.0)

## 2017-12-15 LAB — METHYLMALONIC ACID, SERUM: Methylmalonic Acid, Quantitative: 377 nmol/L (ref 0–378)

## 2017-12-15 MED ORDER — SODIUM CHLORIDE 0.9 % IV SOLN
1.0000 g | INTRAVENOUS | Status: DC
  Administered 2017-12-15 – 2017-12-16 (×2): 1 g via INTRAVENOUS
  Filled 2017-12-15 (×4): qty 10

## 2017-12-15 MED ORDER — DEXTROSE-NACL 5-0.9 % IV SOLN
INTRAVENOUS | Status: DC
  Administered 2017-12-15 – 2017-12-17 (×4): via INTRAVENOUS

## 2017-12-15 NOTE — Progress Notes (Signed)
Patient c/o stomach ache this AM and declined breakfast. She denied nausea and zofran at this time. RN offered saltine crackers and grits to starts, pt stated she feels ok with these bland food. Will continue to monitor.    Sim BoastHavy, RN

## 2017-12-15 NOTE — Progress Notes (Signed)
Pt. Performed -22 on the NIF and .8L on the vital capacity. Pt. States that she is a little sleepy at this time.

## 2017-12-15 NOTE — Progress Notes (Signed)
OT Cancellation    12/15/17 0900  OT Visit Information  Last OT Received On 12/15/17  Reason Eval/Treat Not Completed Patient declined, no reason specified. Pt stating she is nauseous and doesn't want to move. Agreed to participate when nausea resolves. Will return as schedule allows. Thank you.   Ainhoa Rallo MSOT, OTR/L Acute Rehab Pager: [phone] Office: [phone]

## 2017-12-15 NOTE — Consult Note (Signed)
[facility] Gastroenterology Consultation Note  Referring Provider: No ref. provider found Primary Care Physician:  Martha Clan, MD Primary Gastroenterologist:  None  Reason for Consultation:  dysphagia  HPI: Teresa Callahan is a 84 y.o. female presenting with dysphagia.  History of GERD, recent hip fracture with surgical repair.  Has lost "significant" weight since hip fracture.  CT showed fluid filled thickened esophagus and stomach, most consistent with inflammatory process. Neurology in midst of work-up for myasthenia gravis, thinks dysphagia might be due to this, patient started on pyridostigmine.  No family at bedside to discuss case, but I have discussed case with patient's son over the telephone Chestine Spore, phone number [phone]).   Past Medical History:  Diagnosis Date  . Anemia   . Asthma   . Atrophic vaginitis   . Back pain   . Carotid stenosis   . Diverticulosis   . GERD (gastroesophageal reflux disease)   . History of blood transfusion 2014   "not related to a surgery"  . Hypothyroidism   . Incontinence   . Osteoporosis   . Pneumonia    "probably" (05/09/2014)  . Postmenopausal   . Thyroid disease   . UTI (lower urinary tract infection)   . Vertigo     Past Surgical History:  Procedure Laterality Date  . ABDOMINAL HYSTERECTOMY  1957  . CATARACT EXTRACTION W/ INTRAOCULAR LENS  IMPLANT, BILATERAL Bilateral   . COLONOSCOPY  09/30/2001  . EYE SURGERY Left    d/t complications from cataract surgery  . HIP PINNING,CANNULATED Left 11/01/2017  . HIP PINNING,CANNULATED Left 11/01/2017   Procedure: CANNULATED HIP PINNING;  Surgeon: Roby Lofts, MD;  Location: [facility] OR;  Service: Orthopedics;  Laterality: Left;  . TONSILLECTOMY      Prior to Admission medications   Medication Sig Start Date End Date Taking? Authorizing Provider  acetaminophen (TYLENOL) 500 MG tablet Take 500-1,000 mg by mouth every 6 (six) hours as needed (pain).   Yes [provider]  albuterol  (PROVENTIL HFA;VENTOLIN HFA) 108 (90 BASE) MCG/ACT inhaler Inhale 1 puff into the lungs 2 (two) times daily as needed for wheezing or shortness of breath.   Yes [provider]  aspirin EC 81 MG tablet Take 81 mg by mouth daily.   Yes [provider]  doxycycline (ADOXA) 100 MG tablet Take 100 mg by mouth daily. Continuous for UTI prevention   Yes [provider]  estradiol (ESTRACE) 0.1 MG/GM vaginal cream Place 1 Applicatorful vaginally at bedtime as needed (dryness).   Yes [provider]  levothyroxine (SYNTHROID, LEVOTHROID) 75 MCG tablet Take 75 mcg by mouth daily.   Yes [provider]  loperamide (IMODIUM) 2 MG capsule Take 2 mg by mouth as needed for diarrhea or loose stools.   Yes [provider]  naproxen (NAPROSYN) 250 MG tablet Take 250 mg by mouth 2 (two) times daily with a meal.   Yes [provider]  traMADol (ULTRAM) 50 MG tablet Take 1 tablet (50 mg total) by mouth every 6 (six) hours as needed for moderate pain. 11/03/17  Yes Haddix, Gillie Manners, MD  enoxaparin (LOVENOX) 30 MG/0.3ML injection Inject 0.3 mLs (30 mg total) into the skin daily. Patient not taking: Reported on 12/12/2017 11/03/17 12/11/17  Haddix, Gillie Manners, MD    Current Facility-Administered Medications  Medication Dose Route Frequency Provider Last Rate Last Dose  . acetaminophen (TYLENOL) tablet 650 mg  650 mg Oral Q6H PRN Barnetta Chapel, MD   650 mg at  12/13/17 1605  . enoxaparin (LOVENOX) injection 30 mg  30 mg Subcutaneous Q24H Gwenyth Bender, NP   30 mg at 12/14/17 1617  . ondansetron (ZOFRAN) tablet 4 mg  4 mg Oral Q6H PRN Gwenyth Bender, NP       Or  . ondansetron [facility]) injection 4 mg  4 mg Intravenous Q6H PRN Black, Karen M, NP      . pantoprazole (PROTONIX) EC tablet 40 mg  40 mg Oral Daily Berton Mount I, MD   40 mg at 12/15/17 0908  . pyridostigmine (MESTINON) tablet 30 mg  30 mg Oral Q8H Milon Dikes, MD   30 mg at 12/15/17 0617  .  senna-docusate (Senokot-S) tablet 1 tablet  1 tablet Oral QHS Briant Cedar, MD   1 tablet at 12/14/17 2220    Allergies as of 12/11/2017 - Review Complete 12/11/2017  Allergen Reaction Noted  . Horse-derived products Other (See Comments)   . Macrodantin Other (See Comments) 12/04/2011  . Premarin [estrogens, conjugated] Other (See Comments) 05/13/2012  . Sulfa antibiotics Other (See Comments) 11/24/2011    Family History  Problem Relation Age of Onset  . Colon cancer Mother   . Melanoma Father   . Lymphoma Son     Social History   Socioeconomic History  . Marital status: Widowed    Spouse name: Not on file  . Number of children: Not on file  . Years of education: Not on file  . Highest education level: Not on file  Occupational History  . Occupation: retired IT consultant  Social Needs  . Financial resource strain: Not on file  . Food insecurity:    Worry: Not on file    Inability: Not on file  . Transportation needs:    Medical: Not on file    Non-medical: Not on file  Tobacco Use  . Smoking status: Former Smoker    Packs/day: 0.20    Years: 8.00    Pack years: 1.60    Types: Cigarettes    Last attempt to quit: 09/22/1948    Years since quitting: 69.2  . Smokeless tobacco: Never Used  Substance and Sexual Activity  . Alcohol use: Yes    Comment: 1 drink daily  . Drug use: No  . Sexual activity: Not on file  Lifestyle  . Physical activity:    Days per week: Not on file    Minutes per session: Not on file  . Stress: Not on file  Relationships  . Social connections:    Talks on phone: Not on file    Gets together: Not on file    Attends religious service: Not on file    Active member of club or organization: Not on file    Attends meetings of clubs or organizations: Not on file    Relationship status: Not on file  . Intimate partner violence:    Fear of current or ex partner: Not on file    Emotionally abused: Not on file    Physically  abused: Not on file    Forced sexual activity: Not on file  Other Topics Concern  . Not on file  Social History Narrative  . Not on file    Review of Systems: As per HPI, all others negative  Physical Exam: Vital signs in last 24 hours: Temp:  [98.1 F (36.7 C)-98.7 F (37.1 C)] 98.1 F (36.7 C) (03/26 0719) Pulse Rate:  [66-79] 70 (03/26 0400) Resp:  [16-21] 16 (03/26 0000)  BP: (100-137)/(40-54) 116/44 (03/26 0719) SpO2:  [97 %-100 %] 97 % (03/26 0000) Weight:  [100 lb 1.4 oz (45.4 kg)] 100 lb 1.4 oz (45.4 kg) (03/26 0256) Last BM Date: 12/14/17 General:   Alert, elderly, deconditioned, weak-appearing, but NAD Head:  Normocephalic and atraumatic. Eyes:  Sclera clear, no icterus.   Conjunctiva pink. Ears:  Normal auditory acuity. Nose:  No deformity, discharge,  or lesions. Mouth:  No deformity or lesions.  Oropharynx pink & moist. Neck:  Supple; no masses or thyromegaly. Lungs:  Clear throughout to auscultation.   No wheezes, crackles, or rhonchi. No acute distress. Heart:  Regular rate and rhythm; no murmurs, clicks, rubs,  or gallops. Abdomen:  Soft, nontender and nondistended. No masses, hepatosplenomegaly or hernias noted. Normal bowel sounds, without guarding, and without rebound.     Msk:  Diffuse atrophy, but symmetrical without gross deformities. Some contractures. Pulses:  Normal pulses noted. Extremities:  Without clubbing or edema. Neurologic:  Intermittently confused, but arousable and alert at times;  Diffuse weakness Skin:  Intact without significant lesions or rashes. Psych:  Alert and cooperative. Depressed mood, flat affect   Lab Results: Recent Labs    12/13/17 0551 12/14/17 0328 12/15/17 0344  WBC 6.7 5.9 6.1  HGB 9.8* 9.4* 9.4*  HCT 29.9* 28.2* 28.6*  PLT 265 263 283   BMET Recent Labs    12/13/17 0551 12/14/17 0328 12/15/17 0344  NA 137 133* 133*  K 4.0 4.2 4.4  CL 103 98* 102  CO2 26 26 25   GLUCOSE 92 84 85  BUN 17 18 10    CREATININE 0.57 0.61 0.54  CALCIUM 9.2 8.9 8.9   LFT No results for input(s): PROT, ALBUMIN, AST, ALT, ALKPHOS, BILITOT, BILIDIR, IBILI in the last 72 hours. PT/INR No results for input(s): LABPROT, INR in the last 72 hours.  Studies/Results: No results found.  Impression:  1.  Dysphagia. 2.  Abnormal CT chest:  Esophageal and gastric thickening, fluid in esophagus. 3.  Suspected myasthenia gravis.  Plan:  1.  I have discussed case over telephone in detail with son, Chestine SporeClark, [phone]. 2.  Patient is able to eat without vomiting.  Endoscopy risk not insignificant given patient's age and comorbidities.  Regardless of underlying finding, up to and including endoscopy, patient is not a candidate for treatment in her current condition.  As a result, would recommend supportive therapy, ongoing management for possible myasthenia gravis, and would not pursue endoscopy or other GI testing at the present time. 3.  [facility] will sign-off; patient can follow-up with GI on as-needed basis; thank you for the consultation. 4.  Case discussed with Dr. Dartha Lodgegbata of Hospitalist Team.   LOS: 4 days   Freddy JakschUTLAW,Ruhi Kopke M  12/15/2017, 10:08 AM  Cell [phone] If no answer or after 5 PM call [phone]

## 2017-12-15 NOTE — Progress Notes (Signed)
MD at bedside with family. MD order for U, D5NS at 8775ml/hr continuous and chest xray.  Sim BoastHavy, RN

## 2017-12-15 NOTE — Care Management Important Message (Signed)
Important Message  Patient Details  Name: Teresa Callahan MRN: [id number] Date of Birth: May 08, 1918   Medicare Important Message Given:  Yes    Sianne Tejada Stefan ChurchBratton 12/15/2017, 1:41 PM

## 2017-12-15 NOTE — Progress Notes (Signed)
VC 1.2L  And NIF  -22cmH20  Good effort

## 2017-12-15 NOTE — Progress Notes (Signed)
PROGRESS NOTE  Teresa Callahan PJK:[id number] DOB: [birth year] DOA: 12/11/2017 PCP: Marton Redwood, MD  HPI/Recap of past 24 hours: Teresa Callahan is a very pleasant 84 y.o. female with medical history significant for asthma, hypothyroidism, osteoporosis, GERD, carotid stenosis, and hip fracture.  Patient presented with dysphagia, especially with pills; drooling of saliva, which has become worse; tiredness, increasing sleepy, and neck weakness while asleep. Significant unintentional weight loss was also reported.  Initial evaluation was concerning for possible CVA, posterior circulation stroke.  Neurology was also concern about possible myasthenia gravis.  MRI of the brain has not revealed acute CVA.  CT scan revealed thickened esophageal wall, dilated esophagus with air-fluid level.  Neurology is currently working patient up for possible myasthenia gravis.  Antibodies have been sent, but the result is still pending.  Neurology has started patient on Mestinon.  On patient family's persistence, GI team was consulted and the patient was seen by Dr. Paulita Fujita.  GI team has communicated to the patient's family that the patient has no suitable for any endoscopic procedure.  With Mestinon, patient's dysphagia has improved significantly.  Speech was consulted, and the patient is tolerating oral feeds.  The patient continued to make progress up until today, 12/15/2017, when the patient started feeling unwell again.  There are concerns for possible UTI.  UA revealed significant leukocytes.  Urine culture is pending.  Chest x-ray did not reveal any acute process.  Patient has been started on IV fluid and IV Rocephin.  Will follow results of urine cultures.  Patient's daughter was updated.   Assessment/Plan: Principal Problem:   Dysphagia Active Problems:   ANEMIA, IRON DEFICIENCY   Asthma, chronic   Hypothyroidism   Carotid stenosis   Closed left hip fracture (HCC)  Worsening dysphagia: Unclear  etiology History as noted above CT abdomen showed thick-walled mid distal esophagus more likely an inflammatory process than infiltrating tumor ENT on board, no further recommendation SLP on board, passed swallow eval, recommend regular diet Patient continues to tolerate oral feeds without difficulties.  Speech team input is highly appreciated.  GI team called as per patient's family request.  No further GI workup as per Dr. Paulita Fujita.   Possible myasthenia gravis Presents with severe dysarthria, dysphasia, neck weakness Neurology on board suspecting myasthenia gravis MG antibodies pending ESR, CRP pending CT abdomen/pelvis/chest with no significant finding except above Neurology will hold off on starting Mestinon until workup complete and everything else excluded Continue NIFs and FVCs every shift per RT Aspiration precautions Patient will follow up with neurology on discharge.  Antibodies are still pending.  Stroke workup MRI brain without contrast negative for acute CVA PT/OT/SLP on board TSH, B12 pending 12/13/2017: B12 is low normal.  Will check Methylmalonic acid, and will start patient on B12.  Follow folate level.  Normocytic anemia: -Patient has no iron level, and low ferritin. -Patient has low normal B12 level, and very high normal methylmalonic acid -Will start patient on iron replacement and vitamin B12.  -We will check folate level. -Patient likely has combined iron and B12 deficiency.  Asthma Stable Continue inhalers  Possible UTI: -UA revealed large leukocyte. -Follow urine cultures. -Start patient on IV Rocephin.  Code Status: Full  Family Communication: None at bedside  Disposition Plan: Likely DC when patient is optimized.  I understand from the case management that the patient will be discharged with hospice follow-up.  Patient's daughter made it clear that the hospice follow-up was for more support at home and not  for comfort measures.  Patient's  daughter, who is also patient's power of attorney, has declined any form of placement.  Patient's daughter maintains that the patient has home health physical therapy.  Patient's family understand that patient is weak, and could not fall at home.  This risk was discussed extensively with the patient's daughter and the daughter voiced understanding.  Therefore, when patient is optimized, patient will be discharged back home with hospice follow-up and home health physical therapy.    Consultants:  Neurology  ENT  GI  Procedures:  None  Antimicrobials:  None  DVT prophylaxis: Enoxaparin   Objective: Vitals:   12/15/17 0400 12/15/17 0719 12/15/17 1103 12/15/17 1531  BP: (!) 117/53 (!) 116/44 (!) 108/51 (!) 134/59  Pulse: 70  64   Resp:   20   Temp: 98.1 F (36.7 C) 98.1 F (36.7 C) 98.4 F (36.9 C) 97.9 F (36.6 C)  TempSrc: Oral Axillary Axillary Axillary  SpO2:   96%   Weight:      Height:        Intake/Output Summary (Last 24 hours) at 12/15/2017 1730 Last data filed at 12/15/2017 0800 Gross per 24 hour  Intake 120 ml  Output -  Net 120 ml   Filed Weights   12/14/17 0440 12/14/17 0731 12/15/17 0256  Weight: 46.8 kg (103 lb 2.8 oz) 45.1 kg (99 lb 6.8 oz) 45.4 kg (100 lb 1.4 oz)    Exam:   General: NAD.  Excessive salivation noted.  Cardiovascular: S1, S2 present  Respiratory: CTAB  Abdomen: Soft, nontender, nondistended, bowel sounds present  Musculoskeletal: No pedal edema bilaterally  Skin: Multiple bruises noted on both upper and lower extremities  Psychiatry: Normal mood  Neuro: 5/5 strength in all extremities.   Data Reviewed: CBC: Recent Labs  Lab 12/11/17 1344 12/11/17 1350 12/12/17 0401 12/13/17 0551 12/14/17 0328 12/15/17 0344  WBC 9.8  --  6.4 6.7 5.9 6.1  NEUTROABS 8.3*  --   --  4.6 3.7 3.5  HGB 10.7* 11.9* 9.1* 9.8* 9.4* 9.4*  HCT 32.4* 35.0* 27.0* 29.9*  29.1* 28.2* 28.6*  MCV 94.7  --  96.4 95.5 96.2 93.2  PLT 298  --   260 265 263 330   Basic Metabolic Panel: Recent Labs  Lab 12/11/17 1344 12/11/17 1350 12/12/17 0401 12/13/17 0551 12/14/17 0328 12/15/17 0344  NA 133* 135 135 137 133* 133*  K 4.5 4.6 4.2 4.0 4.2 4.4  CL 100* 101 105 103 98* 102  CO2 24  --  '22 26 26 25  '$ GLUCOSE 116* 112* 84 92 84 85  BUN 35* 34* 23* '17 18 10  '$ CREATININE 0.70 0.60 0.57 0.57 0.61 0.54  CALCIUM 9.4  --  8.4* 9.2 8.9 8.9   GFR: Estimated Creatinine Clearance: 28.8 mL/min (by C-G formula based on SCr of 0.54 mg/dL). Liver Function Tests: Recent Labs  Lab 12/11/17 1344  AST 22  ALT 11*  ALKPHOS 82  BILITOT 0.9  PROT 6.6  ALBUMIN 4.0   No results for input(s): LIPASE, AMYLASE in the last 168 hours. No results for input(s): AMMONIA in the last 168 hours. Coagulation Profile: Recent Labs  Lab 12/11/17 1344  INR 0.95   Cardiac Enzymes: No results for input(s): CKTOTAL, CKMB, CKMBINDEX, TROPONINI in the last 168 hours. BNP (last 3 results) No results for input(s): PROBNP in the last 8760 hours. HbA1C: No results for input(s): HGBA1C in the last 72 hours. CBG: Recent Labs  Lab 12/11/17 1350 12/11/17  [place]   Lipid Profile: No results for input(s): CHOL, HDL, LDLCALC, TRIG, CHOLHDL, LDLDIRECT in the last 72 hours. Thyroid Function Tests: Recent Labs    12/13/17 0551  TSH 3.332   Anemia Panel: Recent Labs    12/13/17 0551  VITAMINB12 219  FERRITIN 85  TIBC 256  IRON 32   Urine analysis:    Component Value Date/Time   COLORURINE AMBER (A) 12/15/2017 1130   APPEARANCEUR TURBID (A) 12/15/2017 1130   LABSPEC 1.008 12/15/2017 1130   PHURINE 8.0 12/15/2017 1130   GLUCOSEU NEGATIVE 12/15/2017 1130   HGBUR LARGE (A) 12/15/2017 1130   BILIRUBINUR NEGATIVE 12/15/2017 1130   KETONESUR NEGATIVE 12/15/2017 1130   PROTEINUR NEGATIVE 12/15/2017 1130   UROBILINOGEN 0.2 05/10/2014 0818   NITRITE NEGATIVE 12/15/2017 1130   LEUKOCYTESUR LARGE (A) 12/15/2017 1130   Sepsis  Labs: '@LABRCNTIP'$ (procalcitonin:4,lacticidven:4)  )No results found for this or any previous visit (from the past 240 hour(s)).    Studies: Dg Chest 1 View  Result Date: 12/15/2017 CLINICAL DATA:  Pneumonia. History of asthma. Prior smoking history. EXAM: CHEST  1 VIEW COMPARISON:  CT 12/12/2017.  Chest x-ray 12/11/2017. FINDINGS: Mediastinum and hilar structures are stable. Calcified hilar lymph nodes and pulmonary nodules consistent granulomas disease noted. No acute pulmonary disease identified. No prominent pleural effusion or pneumothorax. Heart size stable. No acute bony abnormality. IMPRESSION: No acute cardiopulmonary disease. Stable calcified hilar lymph nodes and calcified pulmonary nodules consistent prior granulomas disease. Chest is stable from prior exams. Electronically Signed   By: Marcello Moores  Register   On: 12/15/2017 13:41    Scheduled Meds: . enoxaparin  30 mg Subcutaneous Q24H  . pantoprazole  40 mg Oral Daily  . senna-docusate  1 tablet Oral QHS    Continuous Infusions: . cefTRIAXone (ROCEPHIN)  IV Stopped (12/15/17 1429)  . dextrose 5 % and 0.9% NaCl 75 mL/hr at 12/15/17 1125     LOS: 4 days     Bonnell Public, MD [phone] (pager number) [facility]   If 7PM-7AM, please contact night-coverage [email or web address] Password [facility] 12/15/2017, 5:30 PM

## 2017-12-15 NOTE — Progress Notes (Signed)
Occupational Therapy Treatment Patient Details Name: Teresa Callahan MRN: [id number] DOB: [birth year] Today's Date: 12/15/2017    History of present illness 84 y.o. female with medical history significant for asthma, hypothyroidism, osteoporosis, GERD, carotid stenosis, ascending hip fracture presents to the emergency department with the chief complaint difficulty swallowing. Initial evaluation concerning for possible CVA or myasthenia gravis;  12/11/17 CXR indicated Mild cardiomegaly without edema or infiltrate. 2. Kyphotic deformity at the thoracolumbar junction with marked compression deformities; CT chest pending; CT head/neck 12/11/17 negative for possible vessel occlusion. Patient with recent history of fall/with multiple fractures and was at rehab facility until a approximately 10 days ago.   OT comments  Pt continues to present with decreased functional performance and activity tolerance. Pt requiring Max A for LB dressing and toilet hygiene. Pt requiring increased encouragement and VCs to increase occupational participation. Educating daughter on pt's occupational performance and need for increased assistance at home to decrease caregiver burden; daughter verbalizing understanding and reports family is considering possible hospice options. Continue to recommend dc home with HHOT and will continue to follow acutely as admitted.    Follow Up Recommendations  Home health OT;Supervision/Assistance - 24 hour;Other (comment)(Family considering increase assistance and hospice)    Equipment Recommendations  None recommended by OT    Recommendations for Other Services      Precautions / Restrictions Precautions Precautions: Fall Restrictions Weight Bearing Restrictions: No       Mobility Bed Mobility Overal bed mobility: Needs Assistance Bed Mobility: Rolling;Sidelying to Sit;Sit to Supine Rolling: Supervision Sidelying to sit: Mod assist   Sit to supine: Mod assist   General bed  mobility comments: Pt rolling to right side with supervision. Providing cues to bring BLEs off EOB and pt stating she isn't excited about it. Providing Mod A to bring BLEs over EOB and elevate trunk  Transfers Overall transfer level: Needs assistance Equipment used: Rolling walker (2 wheeled) Transfers: Sit to/from [employer] Sit to Stand: Max assist         General transfer comment: Max A for sit<>stand and pt fatiguing quickly. Not reaching upright standing posterior with consistance posterior lean.     Balance Overall balance assessment: Needs assistance;History of Falls Sitting-balance support: No upper extremity supported;Feet unsupported Sitting balance-Leahy Scale: Good     Standing balance support: Bilateral upper extremity supported;During functional activity Standing balance-Leahy Scale: Poor Standing balance comment: Reliant on UE support to maintain static and dynamic balance plus min-mod physical assistance. Pt with posterior lean                           ADL either performed or assessed with clinical judgement   ADL Overall ADL's : Needs assistance/impaired     Grooming: Set up;Wash/dry face;Bed level               Lower Body Dressing: Maximal assistance;Sit to/from stand Lower Body Dressing Details (indicate cue type and reason): Max A for doffing and donning depends. Poor participation and requiring increased cues and encouragment     Toileting- Clothing Manipulation and Hygiene: Maximal assistance;Sit to/from stand Toileting - Clothing Manipulation Details (indicate cue type and reason): Pt requiring Max A to maintain standing and max A for toilet hygiene. Providing increase cues for pt to particiapte in standing but she repeated "I can't"     Functional mobility during ADLs: Moderate assistance;Rolling walker(sit<>stand only) General ADL Comments: Pt not reaching full standing positioning dueint LB dressing  and toilet  hygiene. Pt requiring Max A and presenting with decreased activity tolerance. Fatigues quickly     Vision   Vision Assessment?: No apparent visual deficits   Perception     Praxis      Cognition Arousal/Alertness: Awake/alert Behavior During Therapy: WFL for tasks assessed/performed Overall Cognitive Status: Within Functional Limits for tasks assessed                                          Exercises     Shoulder Instructions       General Comments Discussing session with daugher at end    Pertinent Vitals/ Pain       Pain Assessment: Faces Faces Pain Scale: No hurt Pain Location: Constipation Pain Intervention(s): Monitored during session  Home Living                                          Prior Functioning/Environment              Frequency  Min 3X/week        Progress Toward Goals  OT Goals(current goals can now be found in the care plan section)  Progress towards OT goals: Not progressing toward goals - comment(Decreased motivation)  Acute Rehab OT Goals Patient Stated Goal: go home OT Goal Formulation: With patient Time For Goal Achievement: 12/27/17 Potential to Achieve Goals: Good ADL Goals Pt Will Perform Eating: with modified independence;sitting Pt Will Perform Grooming: sitting;with set-up Pt Will Perform Upper Body Bathing: with min assist;sitting Pt Will Perform Lower Body Bathing: with min assist;sit to/from stand Pt Will Perform Toileting - Clothing Manipulation and hygiene: with min assist;sit to/from stand Pt Will Perform Tub/Shower Transfer: Tub transfer;with min assist;shower seat;ambulating;rolling walker  Plan Discharge plan remains appropriate    Co-evaluation                 AM-PAC PT "6 Clicks" Daily Activity     Outcome Measure   Help from another person eating meals?: A Little Help from another person taking care of personal grooming?: A Lot Help from another person  toileting, which includes using toliet, bedpan, or urinal?: A Lot Help from another person bathing (including washing, rinsing, drying)?: A Lot Help from another person to put on and taking off regular upper body clothing?: A Little Help from another person to put on and taking off regular lower body clothing?: A Lot 6 Click Score: 14    End of Session Equipment Utilized During Treatment: Gait belt  OT Visit Diagnosis: Unsteadiness on feet (R26.81);Other abnormalities of gait and mobility (R26.89);Muscle weakness (generalized) (M62.81);History of falling (Z91.81);Pain   Activity Tolerance Patient limited by fatigue   Patient Left with call bell/phone within reach;with family/visitor present;in bed;with bed alarm set   Nurse Communication Mobility status        Time: [id number] OT Time Calculation (min): 13 min  Charges: OT General Charges $OT Visit: 1 Visit OT Treatments $Self Care/Home Management : 8-22 mins  Jen Eppinger MSOT, OTR/L Acute Rehab Pager: [phone] Office: [phone]   Theodoro GristCharis M Coralee Edberg 12/15/2017, 1:51 PM

## 2017-12-16 LAB — CBC WITH DIFFERENTIAL/PLATELET
Basophils Absolute: 0 10*3/uL (ref 0.0–0.1)
Basophils Relative: 0 %
EOS PCT: 5 %
Eosinophils Absolute: 0.3 10*3/uL (ref 0.0–0.7)
HEMATOCRIT: 30.1 % — AB (ref 36.0–46.0)
Hemoglobin: 9.8 g/dL — ABNORMAL LOW (ref 12.0–15.0)
LYMPHS PCT: 20 %
Lymphs Abs: 1.1 10*3/uL (ref 0.7–4.0)
MCH: 30.9 pg (ref 26.0–34.0)
MCHC: 32.6 g/dL (ref 30.0–36.0)
MCV: 95 fL (ref 78.0–100.0)
MONO ABS: 0.6 10*3/uL (ref 0.1–1.0)
Monocytes Relative: 12 %
NEUTROS ABS: 3.5 10*3/uL (ref 1.7–7.7)
Neutrophils Relative %: 63 %
PLATELETS: 299 10*3/uL (ref 150–400)
RBC: 3.17 MIL/uL — ABNORMAL LOW (ref 3.87–5.11)
RDW: 15.9 % — AB (ref 11.5–15.5)
WBC: 5.6 10*3/uL (ref 4.0–10.5)

## 2017-12-16 LAB — BASIC METABOLIC PANEL
Anion gap: 9 (ref 5–15)
BUN: 7 mg/dL (ref 6–20)
CALCIUM: 8.7 mg/dL — AB (ref 8.9–10.3)
CO2: 25 mmol/L (ref 22–32)
CREATININE: 0.58 mg/dL (ref 0.44–1.00)
Chloride: 102 mmol/L (ref 101–111)
GFR calc Af Amer: >60 mL/min (ref >60)
GFR calc non Af Amer: >60 mL/min (ref >60)
GLUCOSE: 102 mg/dL — AB (ref 65–99)
Potassium: 4.1 mmol/L (ref 3.5–5.1)
Sodium: 136 mmol/L (ref 135–145)

## 2017-12-16 MED ORDER — CYANOCOBALAMIN 1000 MCG/ML IJ SOLN
1000.0000 ug | INTRAMUSCULAR | Status: DC
  Administered 2017-12-16: 1000 ug via INTRAMUSCULAR
  Filled 2017-12-16 (×2): qty 1

## 2017-12-16 MED ORDER — FERROUS SULFATE 325 (65 FE) MG PO TABS
325.0000 mg | ORAL_TABLET | Freq: Every day | ORAL | Status: DC
  Administered 2017-12-17: 325 mg via ORAL
  Filled 2017-12-16 (×2): qty 1

## 2017-12-16 NOTE — Progress Notes (Addendum)
RN offered pt to be up in chair for ADLs/ toileting and breakfast, pt declined and requests to stay in bed. Pt also refused AM medication but ok with vit b injection. RN set up breakfast after peri care and face wash.   Sim BoastHavy, RN

## 2017-12-16 NOTE — Progress Notes (Signed)
RN called lab to run urine culture since specimen already collect and in laboratory at this time.   Sim BoastHavy, RN

## 2017-12-16 NOTE — Progress Notes (Addendum)
PROGRESS NOTE  Teresa Callahan:[id number] DOB: April 11, 1918 DOA: 12/11/2017 PCP: Marton Redwood, MD  HPI/Recap of past 24 hours: Teresa Callahan is a very pleasant 84 y.o. female with medical history significant for asthma, hypothyroidism, osteoporosis, GERD, carotid stenosis, and hip fracture.  Patient presented with dysphagia, especially with pills; drooling of saliva, which has become worse; tiredness, increasing sleepy, and neck weakness while asleep. Significant unintentional weight loss was also reported.  Initial evaluation was concerning for possible CVA, posterior circulation stroke.  Neurology was also concern about possible myasthenia gravis.  MRI of the brain has not revealed acute CVA.  CT scan revealed thickened esophageal wall, dilated esophagus with air-fluid level.  Neurology is currently working patient up for possible myasthenia gravis.  Antibodies have been sent, but the result is still pending.  Neurology has started patient on Mestinon, but was d/c 2/2 side effects On patient family's persistence, GI team was consulted and the patient was seen by Dr. Paulita Fujita.  GI team has communicated to the patient's family that the patient has no suitable for any endoscopic procedure.  With Mestinon, patient's dysphagia has improved significantly.  Speech was consulted, and the patient is tolerating oral feeds.  The patient continued to make progress up until today, 12/15/2017, when the patient started feeling unwell again.  -are concerns for possible UTI.  UA revealed significant leukocytes.  Urine culture is pending.  Chest x-ray did not reveal any acute process.  Patient has been started on IV fluid and IV Rocephin.  Will follow results of urine cultures.  daughter was updated.   Assessment/Plan: Principal Problem:   Dysphagia Active Problems:   ANEMIA, IRON DEFICIENCY   Asthma, chronic   Hypothyroidism   Carotid stenosis   Closed left hip fracture (HCC)  Worsening dysphagia: Unclear  etiology History as noted above CT abdomen showed thick-walled mid distal esophagus more likely an inflammatory process than infiltrating tumor and presbyesophagus ENT on board, no further recommendation SLP on board, passed swallow eval, recommend regular diet Patient continues to tolerate oral feeds without difficulties-- No further GI workup as per Dr. Paulita Fujita.  Possible UTI: -UA revealed large leukocyte. -Follow urine cultures.  -Started patient on IV Rocephin. -patient recent Rx for UTI with Cipro, await Urine cult-p  Possible myasthenia gravis Presents with severe dysarthria, dysphasia, neck weakness Neurology on board suspecting myasthenia gravis MG antibodies pending ESR, CRP pending CT abdomen/pelvis/chest with no significant finding except above Neurology started Mestinon but held due to side effects--d/w Dr. Lorraine Lax 3/27 Continue NIFs and FVCs every shift per RT Antibodies are still pending-will have OP work-up and decision about Rx option then dependant on further lab work  Stroke workup MRI brain without contrast negative for acute CVA PT/OT/SLP on board   Normocytic anemia: -Patient has no iron level, and low ferritin. -Patient has low normal B12 level, and very high normal methylmalonic acid -Will start patient on iron replacement and vitamin B12.  -We will check folate level. -Patient likely has combined iron and B12 deficiency.  Asthma Stable Continue inhalers    Code Status: Full  Family Communication: None at bedside  Disposition Plan: Likely DC when patient is optimized.  I understand from the case management that the patient will be discharged with hospice follow-up.  Patient's daughter made it clear that the hospice follow-up was for more support at home and not for comfort measures.  Patient's daughter, who is also patient's power of attorney, has declined any form of placement- understand that  patient is weak, and could not fall at home.  This risk was  discussed extensively with the patient's daughter and the daughter voiced understanding.  Likely d/c with hospice follow-up and home health physical therapy.    Consultants:  Neurology  ENT  GI  Procedures:  None  Antimicrobials:  None  DVT prophylaxis: Enoxaparin   Objective: Vitals:   12/16/17 0031 12/16/17 0434 12/16/17 0732 12/16/17 1134  BP: (!) 100/48 (!) 125/44 (!) 148/51   Pulse:   72   Resp:   18   Temp: 97.9 F (36.6 C) 98.1 F (36.7 C) 98 F (36.7 C) 97.7 F (36.5 C)  TempSrc: Oral Oral Oral Oral  SpO2:   100%   Weight:  44.5 kg (98 lb 1.7 oz)    Height:        Intake/Output Summary (Last 24 hours) at 12/16/2017 1428 Last data filed at 12/16/2017 0930 Gross per 24 hour  Intake 1703.75 ml  Output -  Net 1703.75 ml   Filed Weights   12/14/17 0731 12/15/17 0256 12/16/17 0434  Weight: 45.1 kg (99 lb 6.8 oz) 45.4 kg (100 lb 1.4 oz) 44.5 kg (98 lb 1.7 oz)    Exam:   General: NAD.  Excessive salivation noted, significant torus, poor lower dentition, strabismus noted  Cardiovascular: S1, S2 present, no m/r/g  Respiratory: CTAB, clear without added sound  Abdomen: Soft, nontender, nondistended, bowel sounds present, no rebound no guard, no cva tednerness  Musculoskeletal: No pedal edema bilaterally  Skin: Multiple bruises noted on both upper and lower extremities  Psychiatry: Normal mood  Neuro: 5/5 strength in all extremities.   Data Reviewed: CBC: Recent Labs  Lab 12/11/17 1344  12/12/17 0401 12/13/17 0551 12/14/17 0328 12/15/17 0344 12/16/17 0350  WBC 9.8  --  6.4 6.7 5.9 6.1 5.6  NEUTROABS 8.3*  --   --  4.6 3.7 3.5 3.5  HGB 10.7*   < > 9.1* 9.8* 9.4* 9.4* 9.8*  HCT 32.4*   < > 27.0* 29.9*  29.1* 28.2* 28.6* 30.1*  MCV 94.7  --  96.4 95.5 96.2 93.2 95.0  PLT 298  --  260 265 263 283 299   < > = values in this interval not displayed.   Basic Metabolic Panel: Recent Labs  Lab 12/12/17 0401 12/13/17 0551 12/14/17 0328  12/15/17 0344 12/16/17 0350  NA 135 137 133* 133* 136  K 4.2 4.0 4.2 4.4 4.1  CL 105 103 98* 102 102  CO2 '22 26 26 25 25  '$ GLUCOSE 84 92 84 85 102*  BUN 23* '17 18 10 7  '$ CREATININE 0.57 0.57 0.61 0.54 0.58  CALCIUM 8.4* 9.2 8.9 8.9 8.7*   GFR: Estimated Creatinine Clearance: 28.2 mL/min (by C-G formula based on SCr of 0.58 mg/dL). Liver Function Tests: Recent Labs  Lab 12/11/17 1344  AST 22  ALT 11*  ALKPHOS 82  BILITOT 0.9  PROT 6.6  ALBUMIN 4.0   No results for input(s): LIPASE, AMYLASE in the last 168 hours. No results for input(s): AMMONIA in the last 168 hours. Coagulation Profile: Recent Labs  Lab 12/11/17 1344  INR 0.95   Cardiac Enzymes: No results for input(s): CKTOTAL, CKMB, CKMBINDEX, TROPONINI in the last 168 hours. BNP (last 3 results) No results for input(s): PROBNP in the last 8760 hours. HbA1C: No results for input(s): HGBA1C in the last 72 hours. CBG: Recent Labs  Lab 12/11/17 1350 12/11/17 2328  GLUCAP 106* 85   Lipid Profile: No results  for input(s): CHOL, HDL, LDLCALC, TRIG, CHOLHDL, LDLDIRECT in the last 72 hours. Thyroid Function Tests: No results for input(s): TSH, T4TOTAL, FREET4, T3FREE, THYROIDAB in the last 72 hours. Anemia Panel: No results for input(s): VITAMINB12, FOLATE, FERRITIN, TIBC, IRON, RETICCTPCT in the last 72 hours. Urine analysis:    Component Value Date/Time   COLORURINE AMBER (A) 12/15/2017 1130   APPEARANCEUR TURBID (A) 12/15/2017 1130   LABSPEC 1.008 12/15/2017 1130   PHURINE 8.0 12/15/2017 1130   GLUCOSEU NEGATIVE 12/15/2017 1130   HGBUR LARGE (A) 12/15/2017 1130   BILIRUBINUR NEGATIVE 12/15/2017 1130   KETONESUR NEGATIVE 12/15/2017 1130   PROTEINUR NEGATIVE 12/15/2017 1130   UROBILINOGEN 0.2 05/10/2014 0818   NITRITE NEGATIVE 12/15/2017 1130   LEUKOCYTESUR LARGE (A) 12/15/2017 1130   Sepsis Labs: '@LABRCNTIP'$ (procalcitonin:4,lacticidven:4)  )No results found for this or any previous visit (from the past  240 hour(s)).    Studies: No results found.  Scheduled Meds: . cyanocobalamin  1,000 mcg Intramuscular Q Wed  . enoxaparin  30 mg Subcutaneous Q24H  . ferrous sulfate  325 mg Oral Q breakfast  . pantoprazole  40 mg Oral Daily  . senna-docusate  1 tablet Oral QHS    Continuous Infusions: . cefTRIAXone (ROCEPHIN)  IV 1 g (12/16/17 1403)  . dextrose 5 % and 0.9% NaCl 75 mL/hr at 12/16/17 0155     LOS: 5 days     Teresa Griffes, MD [facility]   If 7PM-7AM, please contact night-coverage [email or web address] Password [facility] 12/16/2017, 2:28 PM

## 2017-12-16 NOTE — Progress Notes (Signed)
Physical Therapy Treatment Patient Details Name: Teresa Callahan MRN: [id number] DOB: 03-19-1918 Today's Date: 12/16/2017    History of Present Illness 84 y.o. female with medical history significant for asthma, hypothyroidism, osteoporosis, GERD, carotid stenosis, ascending hip fracture presents to the emergency department with the chief complaint difficulty swallowing. Initial evaluation concerning for possible CVA or myasthenia gravis;  12/11/17 CXR indicated Mild cardiomegaly without edema or infiltrate. 2. Kyphotic deformity at the thoracolumbar junction with marked compression deformities; CT chest pending; CT head/neck 12/11/17 negative for possible vessel occlusion. Patient with recent history of fall/with multiple fractures and was at rehab facility until a approximately 10 days ago.    PT Comments    Pt progressing well with ambulation. Pt remains to have generalized weakness, decreased activity tolerance and L LE stiffness/pain. Pt functioning at level family can provide assist. Con't HH services upon d/c with 24/7 assist.    Follow Up Recommendations  Home health PT;Supervision/Assistance - 24 hour     Equipment Recommendations  None recommended by PT    Recommendations for Other Services       Precautions / Restrictions Precautions Precautions: Fall Restrictions Weight Bearing Restrictions: No    Mobility  Bed Mobility Overal bed mobility: Needs Assistance Bed Mobility: Sit to Supine       Sit to supine: Min assist   General bed mobility comments: min A for LE management back into bed. modA to scoot up in bed as well  Transfers Overall transfer level: Needs assistance Equipment used: Rolling walker (2 wheeled) Transfers: Sit to/from Stand Sit to Stand: Mod assist         General transfer comment: modA for power up, v/c's to push up from arm rests, pt with posterior lean  Ambulation/Gait Ambulation/Gait assistance: Mod assist Ambulation Distance (Feet):  50 Feet(x2) Assistive device: Rolling walker (2 wheeled) Gait Pattern/deviations: Step-to pattern;Decreased step length - left;Decreased stance time - left;Antalgic;Narrow base of support Gait velocity: dec Gait velocity interpretation: Below normal speed for age/gender General Gait Details: pt with improved ability to maintain midline/less L lateral lean however continues to have significant trunk flexion. pt required seated rest breakx2 min.    Stairs            Wheelchair Mobility    Modified Rankin (Stroke Patients Only) Modified Rankin (Stroke Patients Only) Pre-Morbid Rankin Score: Moderate disability Modified Rankin: Moderately severe disability     Balance Overall balance assessment: Needs assistance;History of Falls Sitting-balance support: No upper extremity supported;Feet unsupported Sitting balance-Leahy Scale: Good     Standing balance support: Bilateral upper extremity supported;During functional activity Standing balance-Leahy Scale: Poor Standing balance comment: Reliant on UE support to maintain static and dynamic balance plus min-mod physical assistance. Pt with posterior lean                            Cognition Arousal/Alertness: Awake/alert Behavior During Therapy: WFL for tasks assessed/performed Overall Cognitive Status: Impaired/Different from baseline Area of Impairment: Problem solving;Memory                     Memory: Decreased short-term memory       Problem Solving: Slow processing;Decreased initiation;Difficulty sequencing;Requires verbal cues;Requires tactile cues        Exercises      General Comments        Pertinent Vitals/Pain Pain Assessment: Faces Faces Pain Scale: Hurts little more Pain Location: L LE with movement Pain Descriptors /  Indicators: (stiffness) Pain Intervention(s): Patient requesting pain meds-RN notified    Home Living                      Prior Function             PT Goals (current goals can now be found in the care plan section) Acute Rehab PT Goals Patient Stated Goal: go home Progress towards PT goals: Progressing toward goals    Frequency    Min 3X/week      PT Plan Current plan remains appropriate    Co-evaluation              AM-PAC PT "6 Clicks" Daily Activity  Outcome Measure  Difficulty turning over in bed (including adjusting bedclothes, sheets and blankets)?: Unable Difficulty moving from lying on back to sitting on the side of the bed? : Unable Difficulty sitting down on and standing up from a chair with arms (e.g., wheelchair, bedside commode, etc,.)?: Unable Help needed moving to and from a bed to chair (including a wheelchair)?: A Lot Help needed walking in hospital room?: A Lot Help needed climbing 3-5 steps with a railing? : A Lot 6 Click Score: 9    End of Session Equipment Utilized During Treatment: Gait belt Activity Tolerance: Patient limited by fatigue Patient left: in chair;with call bell/phone within reach;with chair alarm set;with nursing/sitter in room Nurse Communication: Mobility status PT Visit Diagnosis: Unsteadiness on feet (R26.81);History of falling (Z91.81);Muscle weakness (generalized) (M62.81)     Time: 1610-9604 PT Time Calculation (min) (ACUTE ONLY): 26 min  Charges:  $Gait Training: 23-37 mins                    G Codes:       Lewis Shock, PT, DPT Pager #: [phone] Office #: [phone]    Nicoli Nardozzi M Lempi Edwin 12/16/2017, 2:51 PM

## 2017-12-16 NOTE — Progress Notes (Signed)
Patient appears more alert at this time, RN noted pt ate her muffin and drink her second cup of coffee.    Sim BoastHavy, RN

## 2017-12-16 NOTE — Progress Notes (Signed)
NIF/VC performed with patient X3. Patient alert and oriented but has weak effort.   VC- 0.75L  NIF -15

## 2017-12-16 NOTE — Progress Notes (Signed)
Spoke to patients daughter, felt patient was have cramping and nausea after starting Mestinon.  Plan Follow on ACH antibodies If positive, will then start steroids, very low dose of mestinon.

## 2017-12-17 LAB — CBC WITH DIFFERENTIAL/PLATELET
BASOS PCT: 0 %
Basophils Absolute: 0 10*3/uL (ref 0.0–0.1)
EOS ABS: 0.4 10*3/uL (ref 0.0–0.7)
Eosinophils Relative: 8 %
HEMATOCRIT: 29.6 % — AB (ref 36.0–46.0)
Hemoglobin: 9.5 g/dL — ABNORMAL LOW (ref 12.0–15.0)
Lymphocytes Relative: 20 %
Lymphs Abs: 1 10*3/uL (ref 0.7–4.0)
MCH: 31 pg (ref 26.0–34.0)
MCHC: 32.1 g/dL (ref 30.0–36.0)
MCV: 96.7 fL (ref 78.0–100.0)
MONO ABS: 0.7 10*3/uL (ref 0.1–1.0)
MONOS PCT: 14 %
Neutro Abs: 2.9 10*3/uL (ref 1.7–7.7)
Neutrophils Relative %: 58 %
Platelets: 315 10*3/uL (ref 150–400)
RBC: 3.06 MIL/uL — ABNORMAL LOW (ref 3.87–5.11)
RDW: 16.1 % — AB (ref 11.5–15.5)
WBC: 5 10*3/uL (ref 4.0–10.5)

## 2017-12-17 LAB — FOLATE RBC
Folate, Hemolysate: 279.5 ng/mL
Folate, RBC: 919 ng/mL (ref >498)
Hematocrit: 30.4 % — ABNORMAL LOW (ref 34.0–46.6)

## 2017-12-17 LAB — BASIC METABOLIC PANEL
Anion gap: 6 (ref 5–15)
BUN: 6 mg/dL (ref 6–20)
CALCIUM: 8.8 mg/dL — AB (ref 8.9–10.3)
CO2: 26 mmol/L (ref 22–32)
Chloride: 105 mmol/L (ref 101–111)
Creatinine, Ser: 0.54 mg/dL (ref 0.44–1.00)
GFR calc non Af Amer: >60 mL/min (ref >60)
Glucose, Bld: 91 mg/dL (ref 65–99)
Potassium: 4.4 mmol/L (ref 3.5–5.1)
SODIUM: 137 mmol/L (ref 135–145)

## 2017-12-17 LAB — URINE CULTURE

## 2017-12-17 MED ORDER — LEVOFLOXACIN 500 MG PO TABS: 500.0000 mg | ORAL_TABLET | ORAL | Status: DC

## 2017-12-17 MED ORDER — LEVOFLOXACIN 500 MG PO TABS: 500.0000 mg | ORAL_TABLET | ORAL | 0 refills | Status: DC

## 2017-12-17 MED ORDER — DOXYCYCLINE MONOHYDRATE 100 MG PO TABS: 100.0000 mg | ORAL_TABLET | Freq: Every day | ORAL | Status: DC

## 2017-12-17 MED ORDER — VITAMIN B-12 1000 MCG PO TABS: 1000.0000 ug | ORAL_TABLET | Freq: Every day | ORAL | 0 refills | Status: AC

## 2017-12-17 MED ORDER — FERROUS SULFATE 325 (65 FE) MG PO TABS: 325.0000 mg | ORAL_TABLET | Freq: Every day | ORAL | 3 refills | Status: AC

## 2017-12-17 NOTE — Progress Notes (Signed)
VC 1050 ml NIF - 18  Good effort, reproducible results.

## 2017-12-17 NOTE — Discharge Summary (Signed)
Physician Discharge Summary  Teresa Callahan XBD:[id number] DOB: Dec 04, 1917 DOA: 12/11/2017  PCP: Marton Redwood, MD  Admit date: 12/11/2017 Discharge date: 12/17/2017  Time spent: 25 minutes  Recommendations for Outpatient Follow-up:  1. Suggest outpatient referral to urology-has been on suppressive therapy with doxycycline for a while and daughter seems convinced that she is having urinary infections although I think she has more likely than not senile vaginosis 2. Patient will need outpatient Follow-up with neurology as an outpatient once myasthenia gravis antibodies return.  I will forward to neuro hospitalist to ensure patient is not lost to follow-up in case these are positive then that referral can be made 3. Recommend he quit toileting and decreasing fluid intake towards the end of the day to prevent urinary retention and risk for UTI 4. Recommend CBC and basic metabolic panel in outpatient setting 5. Recommend dental evaluation for torus and management strategies  Discharge Diagnoses:  Principal Problem:   Dysphagia Active Problems:   ANEMIA, IRON DEFICIENCY   Asthma, chronic   Hypothyroidism   Carotid stenosis   Closed left hip fracture [facility])   Discharge Condition: Improved  Diet recommendation: Heart healthy  Filed Weights   12/14/17 0731 12/15/17 0256 12/16/17 0434  Weight: 45.1 kg (99 lb 6.8 oz) 45.4 kg (100 lb 1.4 oz) 44.5 kg (98 lb 1.7 oz)    History of present illness:   Teresa Callahan a very pleasant84 y.o.femalewith medical history significantfor asthma, hypothyroidism, osteoporosis, GERD, carotid stenosis, and hip fracture.  Patient presented with dysphagia, especially with pills; drooling of saliva, which has become worse; tiredness, increasing sleepy, and neck weakness while asleep. Significant unintentional weight loss was also reported.  Initial evaluation was concerning for possible CVA, posterior circulation stroke.  Neurology was also concern about  possible myasthenia gravis.  MRI of the brain has not revealed acute CVA.  CT scan revealed thickened esophageal wall, dilated esophagus with air-fluid level.  Neurology is currently working patient up for possible myasthenia gravis.  Antibodies have been sent, but the result is still pending.  Neurology has started patient on Mestinon, but was d/c 2/2 side effects On patient family's persistence, GI team was consulted and the patient was seen by Dr. Paulita Fujita.  GI team has communicated to the patient's family that the patient has no suitable for any endoscopic procedure.  Hospital Course:  Worsening dysphagia, NOS Unclear etiology--could be related to myasthenia and probably has some relation to the torus in her mouth-would suggest outpatient dental evaluation CT abdomen showed thick-walled mid distal esophagus more likely an inflammatory process than infiltrating tumor and presbyesophagus ENT on board, no further recommendation SLP on board, passed swallow eval, recommend regular diet Patient continues to tolerate oral feeds without difficulties-- No further GI workup as per Dr. Paulita Fujita. She is eating and drinking reasonably now on discharge  Possible UTI: UA revealed large leukocytes however urine culture showed multiple organisms (unfortunately was collected only after treatment had been started)--discussed with family and feel completion of therapy with Levaquin 1 more dose and then return to suppressive therapy and outpatient referral with name and number of [facility] practice made  Possible myasthenia gravis Presents with severe dysarthria, dysphasia, neck weakness Neurology on board suspecting myasthenia gravis ESR, CRP were negative CT abdomen/pelvis/chest with no significant finding except above Neurology started Mestinon but held due to side effects- -d/w Dr. Lorraine Lax 3/27---Antibodies are still pending-will have OP work-up and decision about Rx option then dependant on further lab  work  Stroke  workup MRI brain without contrast negative for acute CVA PT/OT/SLP on board   Normocytic anemia: -Patient has no iron level, and low ferritin. -Patient has low normal B12 level, and very high normal methylmalonic acid -Will start patient on iron replacement and vitamin B12.  -We will check folate level. -Patient likely has combined iron and B12 deficiency.  Asthma Stable Continue inhalers  Procedures:  CT abdomen, chest x-ray, CT angios head and neck, MRI brain  Consultations:  Neurology  Discharge Exam: Vitals:   12/17/17 0736 12/17/17 1115  BP:  (!) 121/51  Pulse: 85   Resp:    Temp: 97.9 F (36.6 C) 97.6 F (36.4 C)  SpO2: 99%     General: Awake alert mild strabismus no distress significant torus Cardiovascular: S1-S2 no murmur rub or gallop Respiratory: Clinically clear no added sounds Abdomen soft nontender no rebound no guarding No lower extremity edema Neurologically seems intact  Discharge Instructions   Discharge Instructions    Diet - low sodium heart healthy   Complete by:  As directed    Discharge instructions   Complete by:  As directed    Complete 1 more day of Levaquin [this was dosed by the pharmacist but will las tin the body 2 days] then resume doyxcycline and follow up with a Urologist-I will provide the name of a Urologist within the group at [facility] Please get Labs 1 week at PCP office Please follow up with Neurology as an Out-patient to further determine the work-up and management of presumed Myasthenia   Increase activity slowly   Complete by:  As directed      Allergies as of 12/17/2017      Reactions   Horse-derived Products Other (See Comments)   Loss of appetite   Macrodantin Other (See Comments)   Unknown reaction   Premarin [estrogens, Conjugated] Other (See Comments)   Loss of appetite   Sulfa Antibiotics Other (See Comments)   Loss of appetite       Medication List    STOP taking these medications    albuterol 108 (90 Base) MCG/ACT inhaler Commonly known as:  PROVENTIL HFA;VENTOLIN HFA   enoxaparin 30 MG/0.3ML injection Commonly known as:  LOVENOX   loperamide 2 MG capsule Commonly known as:  IMODIUM   naproxen 250 MG tablet Commonly known as:  NAPROSYN   traMADol 50 MG tablet Commonly known as:  ULTRAM     TAKE these medications   acetaminophen 500 MG tablet Commonly known as:  TYLENOL Take 500-1,000 mg by mouth every 6 (six) hours as needed (pain).   aspirin EC 81 MG tablet Take 81 mg by mouth daily.   doxycycline 100 MG tablet Commonly known as:  ADOXA Take 1 tablet (100 mg total) by mouth daily. Continuous for UTI prevention--resume once course of levaquin is complete What changed:  additional instructions   estradiol 0.1 MG/GM vaginal cream Commonly known as:  ESTRACE Place 1 Applicatorful vaginally at bedtime as needed (dryness).   ferrous sulfate 325 (65 FE) MG tablet Take 1 tablet (325 mg total) by mouth daily with breakfast. Start taking on:  12/18/2017   levofloxacin 500 MG tablet Commonly known as:  LEVAQUIN Take 1 tablet (500 mg total) by mouth every other day.   levothyroxine 75 MCG tablet Commonly known as:  SYNTHROID, LEVOTHROID Take 75 mcg by mouth daily.   vitamin B-12 1000 MCG tablet Commonly known as:  CYANOCOBALAMIN Take 1 tablet (1,000 mcg total) by mouth daily.  Allergies  Allergen Reactions  . Horse-Derived Products Other (See Comments)    Loss of appetite  . Macrodantin Other (See Comments)    Unknown reaction  . Premarin [Estrogens, Conjugated] Other (See Comments)    Loss of appetite  . Sulfa Antibiotics Other (See Comments)    Loss of appetite       The results of significant diagnostics from this hospitalization (including imaging, microbiology, ancillary and laboratory) are listed below for reference.    Significant Diagnostic Studies: Ct Angio Head W Or Wo Contrast  Result Date: 12/11/2017 CLINICAL DATA:   84 year old female with difficulty swallowing. Code stroke. Possible brainstem infarct. Recent weight loss. EXAM: CT ANGIOGRAPHY HEAD AND NECK TECHNIQUE: Multidetector CT imaging of the head and neck was performed using the standard protocol during bolus administration of intravenous contrast. Multiplanar CT image reconstructions and MIPs were obtained to evaluate the vascular anatomy. Carotid stenosis measurements (when applicable) are obtained utilizing NASCET criteria, using the distal internal carotid diameter as the denominator. CONTRAST:  44m ISOVUE-370 IOPAMIDOL (ISOVUE-370) INJECTION 76% COMPARISON:  Head CT without contrast 1402 hours today. Brain MRI 05/10/2014. CT Abdomen and Pelvis 06/29/2013. FINDINGS: CTA NECK Skeleton: Advanced cervical spine degeneration. C3-C4 ankylosis. Upper thoracic spine degeneration. No acute osseous abnormality identified. Upper chest: Dilated thoracic esophagus up to 4.2 centimeters diameter with an air-fluid level of the thoracic inlet (series 5, image 143). The visible esophagus remains dilated to below the level of the carina. The distal esophagus on 06/29/2013 appear normal. Negative upper lungs.  No superior mediastinal lymphadenopathy. Other neck: Negative.  No neck mass or lymphadenopathy. Aortic arch: Calcified aortic atherosclerosis. Three vessel arch configuration. Right carotid system: No brachiocephalic or right CCA origin stenosis despite atherosclerosis. There is additional soft plaque in the right CCA, and bulky calcified plaque at the right carotid bifurcation. The right ICA is chronically occluded, unchanged since the 2015 brain MRI. No reconstituted flow in the neck. Left carotid system: No left CCA origin stenosis despite atherosclerosis. At the left carotid bifurcation there is soft and calcified plaque with proximal left ICA stenosis numerically estimated at 60-65 % with respect to the distal vessel (series 8, image 87). The left ICA remains patent to  the skull base without additional stenosis. The left ECA origin is incidentally occluded at its origin, with reconstituted flow in left ECA branches. Vertebral arteries: No proximal right subclavian artery stenosis despite plaque. Calcified plaque at the right vertebral artery origin with moderate to severe stenosis. The right vertebral then is patent to the skull base without additional stenosis. There is bulky soft plaque in the proximal left subclavian artery resulting in high-grade stenosis numerically estimated at 65-70 % with respect to the distal vessel. The left subclavian remains patent, and the left vertebral artery origin is patent without stenosis despite mild calcified plaque. The left vertebral artery is patent to the skull base without additional stenosis. Intermittent left vertebral tortuosity. CTA HEAD Posterior circulation: The distal vertebral arteries are patent. Both PICA origins are patent. There is mild left V4 segment plaque without stenosis. No right V4 segment stenosis. Patent vertebrobasilar junction. Patent basilar artery without plaque or stenosis. SCA and PCA origins are patent and within normal limits. Both posterior communicating arteries are present. The left PCA branches are within normal limits. There is moderate irregularity and stenosis in the right PCA P2 and P3 segments which appears due to atherosclerosis. Anterior circulation: The proximal right ICA siphon is occluded. The right ICA terminus appears reconstituted from the  right posterior communicating artery. The left ICA siphon is patent without stenosis despite calcified plaque. Normal left ophthalmic and posterior communicating artery origins. Both carotid termini are patent. Both MCA and ACA origins are patent. The anterior communicating artery and bilateral ACA branches are within normal limits. The proximal left MCA M1 segment is normal but distally there is moderate to severe irregularity and stenosis (series 11, image  20). The left MCA trifurcation remains patent. There is only mild irregularity of left MCA branches. The right MCA proximal M1 segment is mildly irregular and stenotic. The distal right M1 appears normal. The right MCA bifurcation is patent. The right MCA branches are within normal limits. Venous sinuses: Patent on the delayed images. Anatomic variants: None. Delayed phase: No abnormal enhancement identified. Stable gray-white matter differentiation throughout the brain. Review of the MIP images confirms the above findings IMPRESSION: 1. Negative for emergent large vessel occlusion. Patent basilar artery without atherosclerosis or stenosis. 2. Positive for atherosclerosis with significant stenosis related to the intracranial circulation: - Left ICA origin moderate to severe stenosis (60-65%). - moderate to severe irregularity and stenosis in the distal Left M1 segment and trifurcation. - moderate to severe Right vertebral artery origin stenosis. - severe proximal Left subclavian artery stenosis due to soft plaque, stenosis (65-70%). 3. Chronic occlusion of the right ICA, stable since 2015, with reconstitution of the right ICA terminus from the right posterior communicating artery. 4. Dilated and obstructed appearing proximal thoracic esophagus. Consider benign versus malignant distal esophageal obstruction. 5. Stable CT appearance of the brain since 1402 hours today. Salient findings were discussed by telephone with Dr. Amie Portland on 12/11/2017 beginning at 1541 hours. Electronically Signed   By: Genevie Ann M.D.   On: 12/11/2017 15:55   Dg Chest 1 View  Result Date: 12/15/2017 CLINICAL DATA:  Pneumonia. History of asthma. Prior smoking history. EXAM: CHEST  1 VIEW COMPARISON:  CT 12/12/2017.  Chest x-ray 12/11/2017. FINDINGS: Mediastinum and hilar structures are stable. Calcified hilar lymph nodes and pulmonary nodules consistent granulomas disease noted. No acute pulmonary disease identified. No prominent pleural  effusion or pneumothorax. Heart size stable. No acute bony abnormality. IMPRESSION: No acute cardiopulmonary disease. Stable calcified hilar lymph nodes and calcified pulmonary nodules consistent prior granulomas disease. Chest is stable from prior exams. Electronically Signed   By: Marcello Moores  Register   On: 12/15/2017 13:41   Dg Chest 2 View  Result Date: 12/11/2017 CLINICAL DATA:  Dysphagia, short of breath EXAM: CHEST - 2 VIEW COMPARISON:  10/31/2017, 07/13/2012 FINDINGS: No focal pulmonary opacity or significant effusion. Stable mild cardiomegaly with aortic atherosclerosis. No pneumothorax. Gibbus deformity at the thoracolumbar junction with marked compression deformities, uncertain age but new since 2013. Calcified left hilar nodes. IMPRESSION: 1. Mild cardiomegaly without edema or infiltrate. 2. Kyphotic deformity at the thoracolumbar junction with marked compression deformities. Electronically Signed   By: Donavan Foil M.D.   On: 12/11/2017 15:13   Ct Angio Neck W Or Wo Contrast  Result Date: 12/11/2017 CLINICAL DATA:  84 year old female with difficulty swallowing. Code stroke. Possible brainstem infarct. Recent weight loss. EXAM: CT ANGIOGRAPHY HEAD AND NECK TECHNIQUE: Multidetector CT imaging of the head and neck was performed using the standard protocol during bolus administration of intravenous contrast. Multiplanar CT image reconstructions and MIPs were obtained to evaluate the vascular anatomy. Carotid stenosis measurements (when applicable) are obtained utilizing NASCET criteria, using the distal internal carotid diameter as the denominator. CONTRAST:  76m ISOVUE-370 IOPAMIDOL (ISOVUE-370) INJECTION 76% COMPARISON:  Head CT without contrast 1402 hours today. Brain MRI 05/10/2014. CT Abdomen and Pelvis 06/29/2013. FINDINGS: CTA NECK Skeleton: Advanced cervical spine degeneration. C3-C4 ankylosis. Upper thoracic spine degeneration. No acute osseous abnormality identified. Upper chest: Dilated  thoracic esophagus up to 4.2 centimeters diameter with an air-fluid level of the thoracic inlet (series 5, image 143). The visible esophagus remains dilated to below the level of the carina. The distal esophagus on 06/29/2013 appear normal. Negative upper lungs.  No superior mediastinal lymphadenopathy. Other neck: Negative.  No neck mass or lymphadenopathy. Aortic arch: Calcified aortic atherosclerosis. Three vessel arch configuration. Right carotid system: No brachiocephalic or right CCA origin stenosis despite atherosclerosis. There is additional soft plaque in the right CCA, and bulky calcified plaque at the right carotid bifurcation. The right ICA is chronically occluded, unchanged since the 2015 brain MRI. No reconstituted flow in the neck. Left carotid system: No left CCA origin stenosis despite atherosclerosis. At the left carotid bifurcation there is soft and calcified plaque with proximal left ICA stenosis numerically estimated at 60-65 % with respect to the distal vessel (series 8, image 87). The left ICA remains patent to the skull base without additional stenosis. The left ECA origin is incidentally occluded at its origin, with reconstituted flow in left ECA branches. Vertebral arteries: No proximal right subclavian artery stenosis despite plaque. Calcified plaque at the right vertebral artery origin with moderate to severe stenosis. The right vertebral then is patent to the skull base without additional stenosis. There is bulky soft plaque in the proximal left subclavian artery resulting in high-grade stenosis numerically estimated at 65-70 % with respect to the distal vessel. The left subclavian remains patent, and the left vertebral artery origin is patent without stenosis despite mild calcified plaque. The left vertebral artery is patent to the skull base without additional stenosis. Intermittent left vertebral tortuosity. CTA HEAD Posterior circulation: The distal vertebral arteries are patent.  Both PICA origins are patent. There is mild left V4 segment plaque without stenosis. No right V4 segment stenosis. Patent vertebrobasilar junction. Patent basilar artery without plaque or stenosis. SCA and PCA origins are patent and within normal limits. Both posterior communicating arteries are present. The left PCA branches are within normal limits. There is moderate irregularity and stenosis in the right PCA P2 and P3 segments which appears due to atherosclerosis. Anterior circulation: The proximal right ICA siphon is occluded. The right ICA terminus appears reconstituted from the right posterior communicating artery. The left ICA siphon is patent without stenosis despite calcified plaque. Normal left ophthalmic and posterior communicating artery origins. Both carotid termini are patent. Both MCA and ACA origins are patent. The anterior communicating artery and bilateral ACA branches are within normal limits. The proximal left MCA M1 segment is normal but distally there is moderate to severe irregularity and stenosis (series 11, image 20). The left MCA trifurcation remains patent. There is only mild irregularity of left MCA branches. The right MCA proximal M1 segment is mildly irregular and stenotic. The distal right M1 appears normal. The right MCA bifurcation is patent. The right MCA branches are within normal limits. Venous sinuses: Patent on the delayed images. Anatomic variants: None. Delayed phase: No abnormal enhancement identified. Stable gray-white matter differentiation throughout the brain. Review of the MIP images confirms the above findings IMPRESSION: 1. Negative for emergent large vessel occlusion. Patent basilar artery without atherosclerosis or stenosis. 2. Positive for atherosclerosis with significant stenosis related to the intracranial circulation: - Left ICA origin moderate to severe stenosis (  60-65%). - moderate to severe irregularity and stenosis in the distal Left M1 segment and  trifurcation. - moderate to severe Right vertebral artery origin stenosis. - severe proximal Left subclavian artery stenosis due to soft plaque, stenosis (65-70%). 3. Chronic occlusion of the right ICA, stable since 2015, with reconstitution of the right ICA terminus from the right posterior communicating artery. 4. Dilated and obstructed appearing proximal thoracic esophagus. Consider benign versus malignant distal esophageal obstruction. 5. Stable CT appearance of the brain since 1402 hours today. Salient findings were discussed by telephone with Dr. Amie Portland on 12/11/2017 beginning at 1541 hours. Electronically Signed   By: Genevie Ann M.D.   On: 12/11/2017 15:55   Ct Chest W Contrast  Result Date: 12/12/2017 CLINICAL DATA:  Unintentional weight loss.  Dilated esophagus. EXAM: CT CHEST, ABDOMEN, AND PELVIS WITH CONTRAST TECHNIQUE: Multidetector CT imaging of the chest, abdomen and pelvis was performed following the standard protocol during bolus administration of intravenous contrast. CONTRAST:  79m ISOVUE-300 IOPAMIDOL (ISOVUE-300) INJECTION 61% COMPARISON:  None. FINDINGS: CT CHEST FINDINGS Cardiovascular: The heart is normal in size. No pericardial effusion. Advanced atherosclerotic calcifications involving the thoracic aorta but no aneurysm or dissection. Significant stenosis of the left subclavian artery is noted. Coronary artery calcifications noted. Mediastinum/Nodes: Scattered sub 8 mm mediastinal and hilar lymph nodes. Calcified right hilar lymph nodes are noted. Thick walled mid distal esophagus with suspected edema. Could not exclude infiltrating tumor but esophagitis is also possible. Recommend endoscopy or esophagram. Lungs/Pleura: Emphysematous changes but no acute pulmonary findings such as infiltrate or edema. No worrisome pulmonary lesions or pulmonary nodules. Calcified granulomas are noted in the left lower lobe with adjacent calcified left hilar lymph nodes. Mild pleural thickening at the  left lung base and scarring. Musculoskeletal: No acute bony findings or worrisome bone lesions. Remote posttraumatic changes involving the lower thoracic spine with kyphotic deformity. CT ABDOMEN PELVIS FINDINGS Hepatobiliary: Calcified granulomas are scattered throughout the liver. No worrisome hepatic lesions or intrahepatic biliary dilatation. The gallbladder demonstrates some layering high attenuation material which could be stones or sludge. No common bile duct dilatation. Pancreas: No mass, inflammation or ductal dilatation. Spleen: Calcified granulomas but no enlargement or mass. Adrenals/Urinary Tract: The adrenal glands and kidneys are unremarkable. Renal cortical thinning and scarring but no mass or hydronephrosis. The bladder is unremarkable. Stomach/Bowel: The stomach demonstrates mild diffuse wall thickening and possible edema. Diffuse gastritis is a possibility. The duodenum, small bowel and colon are grossly normal. No acute inflammatory process, mass lesions or obstructive findings. Severe sigmoid diverticulosis without findings for acute diverticulitis. Large amount of stool in the rectum suggesting fecal impaction. The terminal ileum is unremarkable. Vascular/Lymphatic: Severe atherosclerotic calcifications involving the abdominal aorta and branch vessel ostia. No focal aneurysm or dissection. No mesenteric or retroperitoneal mass or adenopathy. Reproductive: Surgically absent. Other: No pelvic mass or free pelvic fluid collections. No inguinal adenopathy or hernia. Musculoskeletal: Remote posttraumatic changes involving the pelvis and left hip. No acute bony findings or worrisome bone lesions. IMPRESSION: 1. Thick-walled mid distal esophagus more likely an inflammatory process than infiltrating tumor. Similar findings involving the stomach suggesting gastritis. Endoscopy may be helpful for further evaluation. No obvious ulcer. 2. No worrisome pulmonary lesions or acute pulmonary findings.  Calcified granulomas noted in the left lower lobe along with calcified left hilar lymph nodes. 3. No abdominal/pelvic mass or lymphadenopathy. Electronically Signed   By: PMarijo SanesM.D.   On: 12/12/2017 12:06   Mr Brain Wo Contrast  Result  Date: 12/12/2017 CLINICAL DATA:  Focal neuro deficit, greater than 6 hours, stroke suspected. Choking episode yesterday. EXAM: MRI HEAD WITHOUT CONTRAST TECHNIQUE: Multiplanar, multiecho pulse sequences of the brain and surrounding structures were obtained without intravenous contrast. COMPARISON:  CT head and CTA head and neck 12/11/2017. MRI brain 05/10/2014. FINDINGS: Brain: Advanced atrophy and white matter disease is similar the prior exam. Ventricles are proportionate to the degree of atrophy. No significant extra-axial fluid collection is present. Brainstem and cerebellum are normal. Cerebellar atrophy is stable. The internal auditory canals are within normal limits bilaterally. Vascular: Abnormal signal is compatible with chronic right internal carotid artery occlusion. The artery is reconstituted at the level of the right posterior communicating artery. There is flow in the left internal carotid artery and both vertebral arteries. Basilar artery is patent. Skull and upper cervical spine: The skull base is within normal limits. The craniocervical junction is normal. Sinuses/Orbits: The paranasal sinuses are clear. There is some fluid in the mastoid air cells bilaterally. No obstructing nasopharyngeal lesion is evident. Bilateral lens replacements are present. Globes and orbits are otherwise within normal limits. IMPRESSION: 1. No acute intracranial abnormality or significant interval change. 2. Stable atrophy and white matter disease. 3. Chronic right ICA occlusion. Electronically Signed   By: San Morelle M.D.   On: 12/12/2017 12:06   Ct Abdomen Pelvis W Contrast  Result Date: 12/12/2017 CLINICAL DATA:  Unintentional weight loss.  Dilated esophagus.  EXAM: CT CHEST, ABDOMEN, AND PELVIS WITH CONTRAST TECHNIQUE: Multidetector CT imaging of the chest, abdomen and pelvis was performed following the standard protocol during bolus administration of intravenous contrast. CONTRAST:  33m ISOVUE-300 IOPAMIDOL (ISOVUE-300) INJECTION 61% COMPARISON:  None. FINDINGS: CT CHEST FINDINGS Cardiovascular: The heart is normal in size. No pericardial effusion. Advanced atherosclerotic calcifications involving the thoracic aorta but no aneurysm or dissection. Significant stenosis of the left subclavian artery is noted. Coronary artery calcifications noted. Mediastinum/Nodes: Scattered sub 8 mm mediastinal and hilar lymph nodes. Calcified right hilar lymph nodes are noted. Thick walled mid distal esophagus with suspected edema. Could not exclude infiltrating tumor but esophagitis is also possible. Recommend endoscopy or esophagram. Lungs/Pleura: Emphysematous changes but no acute pulmonary findings such as infiltrate or edema. No worrisome pulmonary lesions or pulmonary nodules. Calcified granulomas are noted in the left lower lobe with adjacent calcified left hilar lymph nodes. Mild pleural thickening at the left lung base and scarring. Musculoskeletal: No acute bony findings or worrisome bone lesions. Remote posttraumatic changes involving the lower thoracic spine with kyphotic deformity. CT ABDOMEN PELVIS FINDINGS Hepatobiliary: Calcified granulomas are scattered throughout the liver. No worrisome hepatic lesions or intrahepatic biliary dilatation. The gallbladder demonstrates some layering high attenuation material which could be stones or sludge. No common bile duct dilatation. Pancreas: No mass, inflammation or ductal dilatation. Spleen: Calcified granulomas but no enlargement or mass. Adrenals/Urinary Tract: The adrenal glands and kidneys are unremarkable. Renal cortical thinning and scarring but no mass or hydronephrosis. The bladder is unremarkable. Stomach/Bowel: The  stomach demonstrates mild diffuse wall thickening and possible edema. Diffuse gastritis is a possibility. The duodenum, small bowel and colon are grossly normal. No acute inflammatory process, mass lesions or obstructive findings. Severe sigmoid diverticulosis without findings for acute diverticulitis. Large amount of stool in the rectum suggesting fecal impaction. The terminal ileum is unremarkable. Vascular/Lymphatic: Severe atherosclerotic calcifications involving the abdominal aorta and branch vessel ostia. No focal aneurysm or dissection. No mesenteric or retroperitoneal mass or adenopathy. Reproductive: Surgically absent. Other: No pelvic mass or  free pelvic fluid collections. No inguinal adenopathy or hernia. Musculoskeletal: Remote posttraumatic changes involving the pelvis and left hip. No acute bony findings or worrisome bone lesions. IMPRESSION: 1. Thick-walled mid distal esophagus more likely an inflammatory process than infiltrating tumor. Similar findings involving the stomach suggesting gastritis. Endoscopy may be helpful for further evaluation. No obvious ulcer. 2. No worrisome pulmonary lesions or acute pulmonary findings. Calcified granulomas noted in the left lower lobe along with calcified left hilar lymph nodes. 3. No abdominal/pelvic mass or lymphadenopathy. Electronically Signed   By: Marijo Sanes M.D.   On: 12/12/2017 12:06   Ct C-spine No Charge  Result Date: 12/11/2017 CLINICAL DATA:  Dysphagia. EXAM: CT CERVICAL SPINE WITHOUT CONTRAST TECHNIQUE: Multidetector CT imaging of the cervical spine was performed without intravenous contrast. Multiplanar CT image reconstructions were also generated. COMPARISON:  10/31/2017 FINDINGS: Alignment: Normal Skull base and vertebrae: No fracture or primary bone lesion. Soft tissues and spinal canal: No canal compromise. The esophagus is markedly distended and filled with fluid and gas in the region. The differential diagnosis is achalasia versus  distal obstruction. Disc levels: Ordinary osteoarthritis at the C1-2 level. Fusion from C2 through C4. C4-5 facet osteoarthritis on the left with 2 mm of anterolisthesis. Bony left foraminal narrowing. C5-6 fusion. C6-7 degenerative spondylosis and facet arthritis without notable stenosis. C7-T1 and T1-2 facet arthritis without notable stenosis. Upper thoracic region shows non-compressive degenerative changes. Upper chest: Lung apices are clear. Other: None IMPRESSION: Dilated fluid and air-filled esophagus without causative lesion in the region of the scan. This could be due to achalasia or distal obstruction. Degenerative changes of the spine as outlined above, seemingly unrelated to the complaint of dysphagia. Electronically Signed   By: Nelson Chimes M.D.   On: 12/11/2017 15:53   Ct Head Code Stroke Wo Contrast  Result Date: 12/11/2017 CLINICAL DATA:  Code stroke. 84 year old female with difficulty swallowing. EXAM: CT HEAD WITHOUT CONTRAST TECHNIQUE: Contiguous axial images were obtained from the base of the skull through the vertex without intravenous contrast. COMPARISON:  Head CT without contrast 10/31/2017. Brain MRI 05/10/2014 FINDINGS: Brain: Stable cerebral volume. Posterior fossa gray-white matter differentiation appears stable since February. Stable gray-white matter differentiation elsewhere with mild for age patchy cerebral white matter hypodensity. No midline shift, ventriculomegaly, mass effect, evidence of mass lesion, intracranial hemorrhage or evidence of cortically based acute infarction. No cortical encephalomalacia identified. Vascular: Calcified atherosclerosis at the skull base. No suspicious intracranial vascular hyperdensity. Skull: Stable.  No acute osseous abnormality identified. Sinuses/Orbits: Visualized paranasal sinuses and mastoids are stable and well pneumatized. Other: No acute orbit or scalp soft tissue findings. ASPECTS [facility] Stroke Program Early CT Score) - Ganglionic  level infarction (caudate, lentiform nuclei, internal capsule, insula, M1-M3 cortex): 7 - Supraganglionic infarction (M4-M6 cortex): 3 Total score (0-10 with 10 being normal): 10 IMPRESSION: 1. No acute intracranial abnormality and stable non contrast CT appearance of the brain since February. 2. ASPECTS is 10. 3. These results were communicated to Dr. Rory Percy at 2:44 pmon 3/22/2019by text page via the [facility] messaging system. Electronically Signed   By: Genevie Ann M.D.   On: 12/11/2017 14:44    Microbiology: Recent Results (from the past 240 hour(s))  Culture, Urine     Status: Abnormal   Collection Time: 12/15/17 12:00 PM  Result Value Ref Range Status   Specimen Description URINE, RANDOM  Final   Special Requests   Final    NONE Performed at [facility], [address] [place], [place] [address]    Culture MULTIPLE SPECIES PRESENT, SUGGEST RECOLLECTION (A)  Final   Report Status 12/17/2017 FINAL  Final     Labs: Basic Metabolic Panel: Recent Labs  Lab 12/13/17 0551 12/14/17 0328 12/15/17 0344 12/16/17 0350 12/17/17 0548  NA 137 133* 133* 136 137  K 4.0 4.2 4.4 4.1 4.4  CL 103 98* 102 102 105  CO2 '26 26 25 25 26  '$ GLUCOSE 92 84 85 102* 91  BUN '17 18 10 7 6  '$ CREATININE 0.57 0.61 0.54 0.58 0.54  CALCIUM 9.2 8.9 8.9 8.7* 8.8*   Liver Function Tests: Recent Labs  Lab 12/11/17 1344  AST 22  ALT 11*  ALKPHOS 82  BILITOT 0.9  PROT 6.6  ALBUMIN 4.0   No results for input(s): LIPASE, AMYLASE in the last 168 hours. No results for input(s): AMMONIA in the last 168 hours. CBC: Recent Labs  Lab 12/13/17 0551 12/14/17 0328 12/15/17 0344 12/16/17 0350 12/17/17 0548  WBC 6.7 5.9 6.1 5.6 5.0  NEUTROABS 4.6 3.7 3.5 3.5 2.9  HGB 9.8* 9.4* 9.4* 9.8* 9.5*  HCT 29.9*  29.1* 28.2* 28.6* 30.1* 29.6*  MCV 95.5 96.2 93.2 95.0 96.7  PLT 265 263 283 299 315   Cardiac Enzymes: No results for input(s): CKTOTAL, CKMB, CKMBINDEX, TROPONINI in the last 168 hours. BNP: BNP (last 3  results) No results for input(s): BNP in the last 8760 hours.  ProBNP (last 3 results) No results for input(s): PROBNP in the last 8760 hours.  CBG: Recent Labs  Lab 12/11/17 1350 12/11/17 2328  GLUCAP 106* 85       Signed:  Nita Sells MD   [facility] 12/17/2017, 12:58 PM

## 2017-12-17 NOTE — Progress Notes (Signed)
Discharge instructions reviewed with patient/family. All questions answered at this time. Transport home by family.   Teyon Odette, RN 

## 2017-12-17 NOTE — Care Management Note (Signed)
Case Management Note  Patient Details  Name: Teresa Callahan MRN: [id number] Date of Birth: [birth year]  Subjective/Objective:                    Action/Plan: Pt discharging home with [facility]. Pt was being transitioned over to them prior to admission. Bambi with [facility] is aware of d/c.  Family to provide transportation home.   Expected Discharge Date:  12/17/17               Expected Discharge Plan:  Home w Hospice Care  In-House Referral:     Discharge planning Services  CM Consult  Post Acute Care Choice:  Hospice Choice offered to:     DME Arranged:    DME Agency:     HH Arranged:    [facility]  Status of Service:  Completed, signed off  If discussed at Long Length of Stay Meetings, dates discussed:    Additional Comments:  Kermit BaloKelli F Salvador Coupe, RN 12/17/2017, 1:19 PM

## 2017-12-22 NOTE — Care Management Important Message (Signed)
Important Message  Patient Details  Name: Teresa Callahan MRN: [id number] Date of Birth: 08-17-1918   Medicare Important Message Given:  Yes    Dorena BodoIris Mauricia Mertens 12/22/2017, 2:28 PM

## 2017-12-23 LAB — ACETYLCHOLINE RECEPTOR AB, ALL
Acety choline binding ab: <0.03 nmol/L (ref 0.00–0.24)
Acetylchol Block Ab: 17 % (ref 0–25)

## 2017-12-29 DIAGNOSIS — D509 Iron deficiency anemia, unspecified: Secondary | ICD-10-CM | POA: Diagnosis not present

## 2017-12-29 DIAGNOSIS — E038 Other specified hypothyroidism: Secondary | ICD-10-CM | POA: Diagnosis not present

## 2017-12-29 DIAGNOSIS — M6281 Muscle weakness (generalized): Secondary | ICD-10-CM | POA: Diagnosis not present

## 2018-01-15 DIAGNOSIS — E038 Other specified hypothyroidism: Secondary | ICD-10-CM | POA: Diagnosis not present

## 2018-01-15 DIAGNOSIS — R269 Unspecified abnormalities of gait and mobility: Secondary | ICD-10-CM | POA: Diagnosis not present

## 2018-01-15 DIAGNOSIS — E871 Hypo-osmolality and hyponatremia: Secondary | ICD-10-CM | POA: Diagnosis not present

## 2018-01-15 DIAGNOSIS — N3281 Overactive bladder: Secondary | ICD-10-CM | POA: Diagnosis not present

## 2018-01-15 DIAGNOSIS — N390 Urinary tract infection, site not specified: Secondary | ICD-10-CM | POA: Diagnosis not present

## 2018-01-15 DIAGNOSIS — J45909 Unspecified asthma, uncomplicated: Secondary | ICD-10-CM | POA: Diagnosis not present

## 2018-01-15 DIAGNOSIS — M6281 Muscle weakness (generalized): Secondary | ICD-10-CM | POA: Diagnosis not present

## 2018-01-15 DIAGNOSIS — K117 Disturbances of salivary secretion: Secondary | ICD-10-CM | POA: Diagnosis not present

## 2018-01-15 DIAGNOSIS — D509 Iron deficiency anemia, unspecified: Secondary | ICD-10-CM | POA: Diagnosis not present

## 2018-01-15 DIAGNOSIS — M545 Low back pain: Secondary | ICD-10-CM | POA: Diagnosis not present

## 2018-08-11 DIAGNOSIS — Z23 Encounter for immunization: Secondary | ICD-10-CM | POA: Diagnosis not present

## 2018-09-26 ENCOUNTER — Encounter (HOSPITAL_COMMUNITY): Payer: Self-pay

## 2018-09-26 ENCOUNTER — Ambulatory Visit (HOSPITAL_COMMUNITY)
Admission: EM | Admit: 2018-09-26 | Discharge: 2018-09-26 | Disposition: A | Discharge status: Home / Self Care | Payer: Medicare Other | Attending: Urgent Care | Admitting: Urgent Care

## 2018-09-26 ENCOUNTER — Ambulatory Visit (HOSPITAL_COMMUNITY): Payer: Medicare Other

## 2018-09-26 DIAGNOSIS — S40021A Contusion of right upper arm, initial encounter: Secondary | ICD-10-CM

## 2018-09-26 DIAGNOSIS — M25511 Pain in right shoulder: Secondary | ICD-10-CM | POA: Diagnosis not present

## 2018-09-26 DIAGNOSIS — R2681 Unsteadiness on feet: Secondary | ICD-10-CM | POA: Diagnosis not present

## 2018-09-26 DIAGNOSIS — S5011XA Contusion of right forearm, initial encounter: Secondary | ICD-10-CM

## 2018-09-26 DIAGNOSIS — M79631 Pain in right forearm: Secondary | ICD-10-CM

## 2018-09-26 DIAGNOSIS — R58 Hemorrhage, not elsewhere classified: Secondary | ICD-10-CM

## 2018-09-26 DIAGNOSIS — W19XXXA Unspecified fall, initial encounter: Secondary | ICD-10-CM | POA: Diagnosis not present

## 2018-09-26 NOTE — ED Triage Notes (Signed)
Pt presents with significant bruising and swelling of right arm & shoulder.

## 2018-09-26 NOTE — ED Provider Notes (Addendum)
MRN: [id number] DOB: 01-Mar-1918  Subjective:   Teresa Callahan is a 85 y.o. female presenting for suffering an right shoulder injury status post fall onto carpet yesterday.  Patient presents with her daughter and son, report that she lost her footing and fell making impact with her right shoulder first.  She had swelling of her right shoulder, moderate to severe constant sharp pain, significant bruising standing down to the forearm.  Patient's daughter reports that she is always bruised very dramatically.  Patient tried Tylenol last night, naproxen this morning.  She does have a history of asthma.  Denies confusion, altered mental status, chest pain, heart racing, palpitations, loss of sensation of her arm.  Patient's daughter reports that she is able to use her arm as long as she does not involve her shoulder.  No current facility-administered medications for this encounter.   Current Outpatient Medications:  .  acetaminophen (TYLENOL) 500 MG tablet, Take 500-1,000 mg by mouth every 6 (six) hours as needed (pain)., Disp: , Rfl:  .  aspirin EC 81 MG tablet, Take 81 mg by mouth daily., Disp: , Rfl:  .  doxycycline (ADOXA) 100 MG tablet, Take 1 tablet (100 mg total) by mouth daily. Continuous for UTI prevention--resume once course of levaquin is complete, Disp: , Rfl:  .  estradiol (ESTRACE) 0.1 MG/GM vaginal cream, Place 1 Applicatorful vaginally at bedtime as needed (dryness)., Disp: , Rfl:  .  ferrous sulfate 325 (65 FE) MG tablet, Take 1 tablet (325 mg total) by mouth daily with breakfast., Disp: 30 tablet, Rfl: 3 .  levofloxacin (LEVAQUIN) 500 MG tablet, Take 1 tablet (500 mg total) by mouth every other day., Disp: 1 tablet, Rfl: 0 .  levothyroxine (SYNTHROID, LEVOTHROID) 75 MCG tablet, Take 75 mcg by mouth daily., Disp: , Rfl:  .  vitamin B-12 (CYANOCOBALAMIN) 1000 MCG tablet, Take 1 tablet (1,000 mcg total) by mouth daily., Disp: 30 tablet, Rfl: 0    Allergies  Allergen Reactions  .  Horse-Derived Products Other (See Comments)    Loss of appetite  . Macrodantin Other (See Comments)    Unknown reaction  . Premarin [Estrogens, Conjugated] Other (See Comments)    Loss of appetite  . Sulfa Antibiotics Other (See Comments)    Loss of appetite     Past Medical History:  Diagnosis Date  . Anemia   . Asthma   . Atrophic vaginitis   . Back pain   . Carotid stenosis   . Diverticulosis   . GERD (gastroesophageal reflux disease)   . History of blood transfusion 2014   "not related to a surgery"  . Hypothyroidism   . Incontinence   . Osteoporosis   . Pneumonia    "probably" (05/09/2014)  . Postmenopausal   . Thyroid disease   . UTI (lower urinary tract infection)   . Vertigo      Past Surgical History:  Procedure Laterality Date  . ABDOMINAL HYSTERECTOMY  1957  . CATARACT EXTRACTION W/ INTRAOCULAR LENS  IMPLANT, BILATERAL Bilateral   . COLONOSCOPY  09/30/2001  . EYE SURGERY Left    d/t complications from cataract surgery  . HIP PINNING,CANNULATED Left 11/01/2017  . HIP PINNING,CANNULATED Left 11/01/2017   Procedure: CANNULATED HIP PINNING;  Surgeon: Roby LoftsHaddix, Kevin P, MD;  Location: [facility] OR;  Service: Orthopedics;  Laterality: Left;  . TONSILLECTOMY      Objective:   Vitals: BP 102/70 (BP Location: Left Arm)   Pulse 98   Temp 98.8 F (37.1 C) (  Oral)   Resp 16   SpO2 93%   BP Readings from Last 3 Encounters:  09/26/18 102/70  12/17/17 (!) 121/51  11/04/17 (!) 126/57   Physical Exam Constitutional:      General: She is not in acute distress.    Appearance: Normal appearance. She is well-developed. She is not ill-appearing, toxic-appearing or diaphoretic.  HENT:     Head: Normocephalic and atraumatic.     Nose: Nose normal.     Mouth/Throat:     Mouth: Mucous membranes are moist.  Eyes:     Extraocular Movements: Extraocular movements intact.     Pupils: Pupils are equal, round, and reactive to light.  Cardiovascular:     Rate and Rhythm: Normal  rate and regular rhythm.     Pulses: Normal pulses.     Heart sounds: Normal heart sounds. No murmur. No friction rub. No gallop.      Comments: Normal radial pulse. Pulmonary:     Effort: Pulmonary effort is normal. No respiratory distress.     Breath sounds: Normal breath sounds. No stridor. No wheezing, rhonchi or rales.  Musculoskeletal:     Right shoulder: She exhibits decreased range of motion, tenderness (lateral deltoid), bony tenderness, swelling, crepitus and decreased strength. She exhibits no deformity and no laceration.     Right elbow: She exhibits decreased range of motion (mild) and swelling (treace). She exhibits no deformity and no laceration. No tenderness found. No radial head, no medial epicondyle, no lateral epicondyle and no olecranon process tenderness noted.     Right forearm: She exhibits tenderness (mild). She exhibits no bony tenderness, no swelling, no edema, no deformity and no laceration.       Arms:     Right hand: She exhibits normal range of motion, no tenderness, no bony tenderness, normal capillary refill, no deformity, no laceration and no swelling. Normal sensation noted. Normal strength noted.  Skin:    General: Skin is warm and dry.     Findings: No rash.  Neurological:     Mental Status: She is alert and oriented to person, place, and time.  Psychiatric:        Mood and Affect: Mood normal.        Behavior: Behavior normal.        Thought Content: Thought content normal.    Assessment and Plan :   Fall, initial encounter  Acute pain of right shoulder  Arm contusion, right, initial encounter  Ecchymosis  Right forearm pain  I discussed patient's need for appropriate imaging at length.  We are not able to perform this in a safe way given that patient is very unsteady and cannot stand on her own.  I cannot put our nursing staff at risk of undue radiation to prop her up for imaging.  Given her age, physical exam findings it is very important  to rule out fracture.  I do not believe that she needs to be in the ER for this either.  She does have an orthopedist with Dr. Murphy/Dr. Thurston Hole, Dr. Jena Gauss.  Her pain is mild to moderate and if she is not physically active does not have pain.  Counseled patient's daughter and son extensively on signs of compartment syndrome and ER precautions.  Currently she has great pulses, appropriate temperature, no discoloration indicating compromise circulation.  We will manage with over-the-counter Tylenol as both the family and I agree that using narcotics is inappropriate.  They will contact her orthopedist practices first thing  tomorrow.  In the meantime use the shoulder sling.  ER precautions reviewed.     Wallis Bamberg, PA-C 09/26/18 1415    Wallis Bamberg, PA-C 09/26/18 1416

## 2018-09-26 NOTE — Discharge Instructions (Signed)
Take 500-650mg  of Tylenol once every 6 hours as needed for moderate-severe pain. Make sure you contact Dr. Jena Gauss or Dr. Murphy/Dr. Thurston Hole asap tomorrow for an urgent consult and imaging.

## 2018-09-27 DIAGNOSIS — S52102A Unspecified fracture of upper end of left radius, initial encounter for closed fracture: Secondary | ICD-10-CM | POA: Diagnosis not present

## 2018-09-27 DIAGNOSIS — S72032D Displaced midcervical fracture of left femur, subsequent encounter for closed fracture with routine healing: Secondary | ICD-10-CM | POA: Diagnosis not present

## 2018-10-20 DIAGNOSIS — S52102D Unspecified fracture of upper end of left radius, subsequent encounter for closed fracture with routine healing: Secondary | ICD-10-CM | POA: Diagnosis not present

## 2018-10-22 DIAGNOSIS — R2681 Unsteadiness on feet: Secondary | ICD-10-CM | POA: Diagnosis not present

## 2018-10-22 DIAGNOSIS — M810 Age-related osteoporosis without current pathological fracture: Secondary | ICD-10-CM | POA: Diagnosis not present

## 2018-10-22 DIAGNOSIS — W19XXXD Unspecified fall, subsequent encounter: Secondary | ICD-10-CM | POA: Diagnosis not present

## 2018-10-22 DIAGNOSIS — S42201D Unspecified fracture of upper end of right humerus, subsequent encounter for fracture with routine healing: Secondary | ICD-10-CM | POA: Diagnosis not present

## 2018-10-22 DIAGNOSIS — E038 Other specified hypothyroidism: Secondary | ICD-10-CM | POA: Diagnosis not present

## 2018-10-22 DIAGNOSIS — M6281 Muscle weakness (generalized): Secondary | ICD-10-CM | POA: Diagnosis not present

## 2018-10-22 DIAGNOSIS — E039 Hypothyroidism, unspecified: Secondary | ICD-10-CM | POA: Diagnosis not present

## 2018-10-25 DIAGNOSIS — M810 Age-related osteoporosis without current pathological fracture: Secondary | ICD-10-CM | POA: Diagnosis not present

## 2018-10-25 DIAGNOSIS — S42201D Unspecified fracture of upper end of right humerus, subsequent encounter for fracture with routine healing: Secondary | ICD-10-CM | POA: Diagnosis not present

## 2018-10-25 DIAGNOSIS — W19XXXD Unspecified fall, subsequent encounter: Secondary | ICD-10-CM | POA: Diagnosis not present

## 2018-10-25 DIAGNOSIS — R2681 Unsteadiness on feet: Secondary | ICD-10-CM | POA: Diagnosis not present

## 2018-10-25 DIAGNOSIS — M6281 Muscle weakness (generalized): Secondary | ICD-10-CM | POA: Diagnosis not present

## 2018-10-25 DIAGNOSIS — E039 Hypothyroidism, unspecified: Secondary | ICD-10-CM | POA: Diagnosis not present

## 2018-10-29 DIAGNOSIS — I959 Hypotension, unspecified: Secondary | ICD-10-CM | POA: Diagnosis not present

## 2018-10-29 DIAGNOSIS — R2681 Unsteadiness on feet: Secondary | ICD-10-CM | POA: Diagnosis not present

## 2018-10-29 DIAGNOSIS — K117 Disturbances of salivary secretion: Secondary | ICD-10-CM | POA: Diagnosis not present

## 2018-10-29 DIAGNOSIS — R269 Unspecified abnormalities of gait and mobility: Secondary | ICD-10-CM | POA: Diagnosis not present

## 2018-10-29 DIAGNOSIS — M810 Age-related osteoporosis without current pathological fracture: Secondary | ICD-10-CM | POA: Diagnosis not present

## 2018-10-29 DIAGNOSIS — R1310 Dysphagia, unspecified: Secondary | ICD-10-CM | POA: Diagnosis not present

## 2018-10-29 DIAGNOSIS — W19XXXD Unspecified fall, subsequent encounter: Secondary | ICD-10-CM | POA: Diagnosis not present

## 2018-10-29 DIAGNOSIS — E039 Hypothyroidism, unspecified: Secondary | ICD-10-CM | POA: Diagnosis not present

## 2018-10-29 DIAGNOSIS — S42301A Unspecified fracture of shaft of humerus, right arm, initial encounter for closed fracture: Secondary | ICD-10-CM | POA: Diagnosis not present

## 2018-10-29 DIAGNOSIS — M6281 Muscle weakness (generalized): Secondary | ICD-10-CM | POA: Diagnosis not present

## 2018-10-29 DIAGNOSIS — M79642 Pain in left hand: Secondary | ICD-10-CM | POA: Diagnosis not present

## 2018-10-29 DIAGNOSIS — S42201D Unspecified fracture of upper end of right humerus, subsequent encounter for fracture with routine healing: Secondary | ICD-10-CM | POA: Diagnosis not present

## 2018-10-29 DIAGNOSIS — Z741 Need for assistance with personal care: Secondary | ICD-10-CM | POA: Diagnosis not present

## 2018-11-04 DIAGNOSIS — E039 Hypothyroidism, unspecified: Secondary | ICD-10-CM | POA: Diagnosis not present

## 2018-11-04 DIAGNOSIS — M810 Age-related osteoporosis without current pathological fracture: Secondary | ICD-10-CM | POA: Diagnosis not present

## 2018-11-04 DIAGNOSIS — S42201D Unspecified fracture of upper end of right humerus, subsequent encounter for fracture with routine healing: Secondary | ICD-10-CM | POA: Diagnosis not present

## 2018-11-04 DIAGNOSIS — M6281 Muscle weakness (generalized): Secondary | ICD-10-CM | POA: Diagnosis not present

## 2018-11-04 DIAGNOSIS — R2681 Unsteadiness on feet: Secondary | ICD-10-CM | POA: Diagnosis not present

## 2018-11-04 DIAGNOSIS — W19XXXD Unspecified fall, subsequent encounter: Secondary | ICD-10-CM | POA: Diagnosis not present

## 2018-11-05 DIAGNOSIS — W19XXXD Unspecified fall, subsequent encounter: Secondary | ICD-10-CM | POA: Diagnosis not present

## 2018-11-05 DIAGNOSIS — M810 Age-related osteoporosis without current pathological fracture: Secondary | ICD-10-CM | POA: Diagnosis not present

## 2018-11-05 DIAGNOSIS — R2681 Unsteadiness on feet: Secondary | ICD-10-CM | POA: Diagnosis not present

## 2018-11-05 DIAGNOSIS — M6281 Muscle weakness (generalized): Secondary | ICD-10-CM | POA: Diagnosis not present

## 2018-11-05 DIAGNOSIS — E039 Hypothyroidism, unspecified: Secondary | ICD-10-CM | POA: Diagnosis not present

## 2018-11-05 DIAGNOSIS — S42201D Unspecified fracture of upper end of right humerus, subsequent encounter for fracture with routine healing: Secondary | ICD-10-CM | POA: Diagnosis not present

## 2018-11-08 DIAGNOSIS — S42201D Unspecified fracture of upper end of right humerus, subsequent encounter for fracture with routine healing: Secondary | ICD-10-CM | POA: Diagnosis not present

## 2018-11-08 DIAGNOSIS — W19XXXD Unspecified fall, subsequent encounter: Secondary | ICD-10-CM | POA: Diagnosis not present

## 2018-11-08 DIAGNOSIS — M810 Age-related osteoporosis without current pathological fracture: Secondary | ICD-10-CM | POA: Diagnosis not present

## 2018-11-08 DIAGNOSIS — M6281 Muscle weakness (generalized): Secondary | ICD-10-CM | POA: Diagnosis not present

## 2018-11-08 DIAGNOSIS — R2681 Unsteadiness on feet: Secondary | ICD-10-CM | POA: Diagnosis not present

## 2018-11-08 DIAGNOSIS — E039 Hypothyroidism, unspecified: Secondary | ICD-10-CM | POA: Diagnosis not present

## 2018-11-09 DIAGNOSIS — M6281 Muscle weakness (generalized): Secondary | ICD-10-CM | POA: Diagnosis not present

## 2018-11-09 DIAGNOSIS — M810 Age-related osteoporosis without current pathological fracture: Secondary | ICD-10-CM | POA: Diagnosis not present

## 2018-11-09 DIAGNOSIS — R2681 Unsteadiness on feet: Secondary | ICD-10-CM | POA: Diagnosis not present

## 2018-11-09 DIAGNOSIS — E039 Hypothyroidism, unspecified: Secondary | ICD-10-CM | POA: Diagnosis not present

## 2018-11-09 DIAGNOSIS — S42201D Unspecified fracture of upper end of right humerus, subsequent encounter for fracture with routine healing: Secondary | ICD-10-CM | POA: Diagnosis not present

## 2018-11-09 DIAGNOSIS — W19XXXD Unspecified fall, subsequent encounter: Secondary | ICD-10-CM | POA: Diagnosis not present

## 2018-11-10 DIAGNOSIS — R2681 Unsteadiness on feet: Secondary | ICD-10-CM | POA: Diagnosis not present

## 2018-11-10 DIAGNOSIS — W19XXXD Unspecified fall, subsequent encounter: Secondary | ICD-10-CM | POA: Diagnosis not present

## 2018-11-10 DIAGNOSIS — M6281 Muscle weakness (generalized): Secondary | ICD-10-CM | POA: Diagnosis not present

## 2018-11-10 DIAGNOSIS — M810 Age-related osteoporosis without current pathological fracture: Secondary | ICD-10-CM | POA: Diagnosis not present

## 2018-11-10 DIAGNOSIS — E039 Hypothyroidism, unspecified: Secondary | ICD-10-CM | POA: Diagnosis not present

## 2018-11-10 DIAGNOSIS — S42201D Unspecified fracture of upper end of right humerus, subsequent encounter for fracture with routine healing: Secondary | ICD-10-CM | POA: Diagnosis not present

## 2018-11-11 DIAGNOSIS — M6281 Muscle weakness (generalized): Secondary | ICD-10-CM | POA: Diagnosis not present

## 2018-11-11 DIAGNOSIS — R2681 Unsteadiness on feet: Secondary | ICD-10-CM | POA: Diagnosis not present

## 2018-11-11 DIAGNOSIS — W19XXXD Unspecified fall, subsequent encounter: Secondary | ICD-10-CM | POA: Diagnosis not present

## 2018-11-11 DIAGNOSIS — M810 Age-related osteoporosis without current pathological fracture: Secondary | ICD-10-CM | POA: Diagnosis not present

## 2018-11-11 DIAGNOSIS — S42201D Unspecified fracture of upper end of right humerus, subsequent encounter for fracture with routine healing: Secondary | ICD-10-CM | POA: Diagnosis not present

## 2018-11-11 DIAGNOSIS — E039 Hypothyroidism, unspecified: Secondary | ICD-10-CM | POA: Diagnosis not present

## 2018-11-12 DIAGNOSIS — W19XXXD Unspecified fall, subsequent encounter: Secondary | ICD-10-CM | POA: Diagnosis not present

## 2018-11-12 DIAGNOSIS — M6281 Muscle weakness (generalized): Secondary | ICD-10-CM | POA: Diagnosis not present

## 2018-11-12 DIAGNOSIS — S42201D Unspecified fracture of upper end of right humerus, subsequent encounter for fracture with routine healing: Secondary | ICD-10-CM | POA: Diagnosis not present

## 2018-11-12 DIAGNOSIS — R2681 Unsteadiness on feet: Secondary | ICD-10-CM | POA: Diagnosis not present

## 2018-11-12 DIAGNOSIS — M810 Age-related osteoporosis without current pathological fracture: Secondary | ICD-10-CM | POA: Diagnosis not present

## 2018-11-12 DIAGNOSIS — E039 Hypothyroidism, unspecified: Secondary | ICD-10-CM | POA: Diagnosis not present

## 2018-11-15 DIAGNOSIS — S42201D Unspecified fracture of upper end of right humerus, subsequent encounter for fracture with routine healing: Secondary | ICD-10-CM | POA: Diagnosis not present

## 2018-11-15 DIAGNOSIS — M810 Age-related osteoporosis without current pathological fracture: Secondary | ICD-10-CM | POA: Diagnosis not present

## 2018-11-15 DIAGNOSIS — R2681 Unsteadiness on feet: Secondary | ICD-10-CM | POA: Diagnosis not present

## 2018-11-15 DIAGNOSIS — W19XXXD Unspecified fall, subsequent encounter: Secondary | ICD-10-CM | POA: Diagnosis not present

## 2018-11-15 DIAGNOSIS — M6281 Muscle weakness (generalized): Secondary | ICD-10-CM | POA: Diagnosis not present

## 2018-11-15 DIAGNOSIS — E039 Hypothyroidism, unspecified: Secondary | ICD-10-CM | POA: Diagnosis not present

## 2018-11-16 DIAGNOSIS — S42201D Unspecified fracture of upper end of right humerus, subsequent encounter for fracture with routine healing: Secondary | ICD-10-CM | POA: Diagnosis not present

## 2018-11-17 DIAGNOSIS — M6281 Muscle weakness (generalized): Secondary | ICD-10-CM | POA: Diagnosis not present

## 2018-11-17 DIAGNOSIS — M810 Age-related osteoporosis without current pathological fracture: Secondary | ICD-10-CM | POA: Diagnosis not present

## 2018-11-17 DIAGNOSIS — E039 Hypothyroidism, unspecified: Secondary | ICD-10-CM | POA: Diagnosis not present

## 2018-11-17 DIAGNOSIS — R2681 Unsteadiness on feet: Secondary | ICD-10-CM | POA: Diagnosis not present

## 2018-11-17 DIAGNOSIS — W19XXXD Unspecified fall, subsequent encounter: Secondary | ICD-10-CM | POA: Diagnosis not present

## 2018-11-17 DIAGNOSIS — S42201D Unspecified fracture of upper end of right humerus, subsequent encounter for fracture with routine healing: Secondary | ICD-10-CM | POA: Diagnosis not present

## 2018-11-18 DIAGNOSIS — M810 Age-related osteoporosis without current pathological fracture: Secondary | ICD-10-CM | POA: Diagnosis not present

## 2018-11-18 DIAGNOSIS — S42201D Unspecified fracture of upper end of right humerus, subsequent encounter for fracture with routine healing: Secondary | ICD-10-CM | POA: Diagnosis not present

## 2018-11-18 DIAGNOSIS — R2681 Unsteadiness on feet: Secondary | ICD-10-CM | POA: Diagnosis not present

## 2018-11-18 DIAGNOSIS — M6281 Muscle weakness (generalized): Secondary | ICD-10-CM | POA: Diagnosis not present

## 2018-11-18 DIAGNOSIS — W19XXXD Unspecified fall, subsequent encounter: Secondary | ICD-10-CM | POA: Diagnosis not present

## 2018-11-18 DIAGNOSIS — E039 Hypothyroidism, unspecified: Secondary | ICD-10-CM | POA: Diagnosis not present

## 2018-11-19 DIAGNOSIS — M6281 Muscle weakness (generalized): Secondary | ICD-10-CM | POA: Diagnosis not present

## 2018-11-19 DIAGNOSIS — R2681 Unsteadiness on feet: Secondary | ICD-10-CM | POA: Diagnosis not present

## 2018-11-19 DIAGNOSIS — E039 Hypothyroidism, unspecified: Secondary | ICD-10-CM | POA: Diagnosis not present

## 2018-11-19 DIAGNOSIS — S42201D Unspecified fracture of upper end of right humerus, subsequent encounter for fracture with routine healing: Secondary | ICD-10-CM | POA: Diagnosis not present

## 2018-11-19 DIAGNOSIS — W19XXXD Unspecified fall, subsequent encounter: Secondary | ICD-10-CM | POA: Diagnosis not present

## 2018-11-19 DIAGNOSIS — M810 Age-related osteoporosis without current pathological fracture: Secondary | ICD-10-CM | POA: Diagnosis not present

## 2018-11-21 DIAGNOSIS — M810 Age-related osteoporosis without current pathological fracture: Secondary | ICD-10-CM | POA: Diagnosis not present

## 2018-11-21 DIAGNOSIS — W19XXXD Unspecified fall, subsequent encounter: Secondary | ICD-10-CM | POA: Diagnosis not present

## 2018-11-21 DIAGNOSIS — M6281 Muscle weakness (generalized): Secondary | ICD-10-CM | POA: Diagnosis not present

## 2018-11-21 DIAGNOSIS — E039 Hypothyroidism, unspecified: Secondary | ICD-10-CM | POA: Diagnosis not present

## 2018-11-21 DIAGNOSIS — S42201D Unspecified fracture of upper end of right humerus, subsequent encounter for fracture with routine healing: Secondary | ICD-10-CM | POA: Diagnosis not present

## 2018-11-21 DIAGNOSIS — R2681 Unsteadiness on feet: Secondary | ICD-10-CM | POA: Diagnosis not present

## 2018-11-22 DIAGNOSIS — E039 Hypothyroidism, unspecified: Secondary | ICD-10-CM | POA: Diagnosis not present

## 2018-11-22 DIAGNOSIS — W19XXXD Unspecified fall, subsequent encounter: Secondary | ICD-10-CM | POA: Diagnosis not present

## 2018-11-22 DIAGNOSIS — S42201D Unspecified fracture of upper end of right humerus, subsequent encounter for fracture with routine healing: Secondary | ICD-10-CM | POA: Diagnosis not present

## 2018-11-22 DIAGNOSIS — M810 Age-related osteoporosis without current pathological fracture: Secondary | ICD-10-CM | POA: Diagnosis not present

## 2018-11-22 DIAGNOSIS — M6281 Muscle weakness (generalized): Secondary | ICD-10-CM | POA: Diagnosis not present

## 2018-11-22 DIAGNOSIS — R2681 Unsteadiness on feet: Secondary | ICD-10-CM | POA: Diagnosis not present

## 2018-11-23 DIAGNOSIS — R2681 Unsteadiness on feet: Secondary | ICD-10-CM | POA: Diagnosis not present

## 2018-11-23 DIAGNOSIS — S42201D Unspecified fracture of upper end of right humerus, subsequent encounter for fracture with routine healing: Secondary | ICD-10-CM | POA: Diagnosis not present

## 2018-11-23 DIAGNOSIS — W19XXXD Unspecified fall, subsequent encounter: Secondary | ICD-10-CM | POA: Diagnosis not present

## 2018-11-23 DIAGNOSIS — M810 Age-related osteoporosis without current pathological fracture: Secondary | ICD-10-CM | POA: Diagnosis not present

## 2018-11-23 DIAGNOSIS — M6281 Muscle weakness (generalized): Secondary | ICD-10-CM | POA: Diagnosis not present

## 2018-11-23 DIAGNOSIS — E039 Hypothyroidism, unspecified: Secondary | ICD-10-CM | POA: Diagnosis not present

## 2018-11-24 DIAGNOSIS — R2681 Unsteadiness on feet: Secondary | ICD-10-CM | POA: Diagnosis not present

## 2018-11-24 DIAGNOSIS — W19XXXD Unspecified fall, subsequent encounter: Secondary | ICD-10-CM | POA: Diagnosis not present

## 2018-11-24 DIAGNOSIS — M6281 Muscle weakness (generalized): Secondary | ICD-10-CM | POA: Diagnosis not present

## 2018-11-24 DIAGNOSIS — M810 Age-related osteoporosis without current pathological fracture: Secondary | ICD-10-CM | POA: Diagnosis not present

## 2018-11-24 DIAGNOSIS — S42201D Unspecified fracture of upper end of right humerus, subsequent encounter for fracture with routine healing: Secondary | ICD-10-CM | POA: Diagnosis not present

## 2018-11-24 DIAGNOSIS — E039 Hypothyroidism, unspecified: Secondary | ICD-10-CM | POA: Diagnosis not present

## 2018-11-30 DIAGNOSIS — W19XXXD Unspecified fall, subsequent encounter: Secondary | ICD-10-CM | POA: Diagnosis not present

## 2018-11-30 DIAGNOSIS — M6281 Muscle weakness (generalized): Secondary | ICD-10-CM | POA: Diagnosis not present

## 2018-11-30 DIAGNOSIS — S42201D Unspecified fracture of upper end of right humerus, subsequent encounter for fracture with routine healing: Secondary | ICD-10-CM | POA: Diagnosis not present

## 2018-11-30 DIAGNOSIS — E039 Hypothyroidism, unspecified: Secondary | ICD-10-CM | POA: Diagnosis not present

## 2018-11-30 DIAGNOSIS — R2681 Unsteadiness on feet: Secondary | ICD-10-CM | POA: Diagnosis not present

## 2018-11-30 DIAGNOSIS — M810 Age-related osteoporosis without current pathological fracture: Secondary | ICD-10-CM | POA: Diagnosis not present

## 2018-12-01 DIAGNOSIS — W19XXXD Unspecified fall, subsequent encounter: Secondary | ICD-10-CM | POA: Diagnosis not present

## 2018-12-01 DIAGNOSIS — S42201D Unspecified fracture of upper end of right humerus, subsequent encounter for fracture with routine healing: Secondary | ICD-10-CM | POA: Diagnosis not present

## 2018-12-01 DIAGNOSIS — R2681 Unsteadiness on feet: Secondary | ICD-10-CM | POA: Diagnosis not present

## 2018-12-01 DIAGNOSIS — M6281 Muscle weakness (generalized): Secondary | ICD-10-CM | POA: Diagnosis not present

## 2018-12-01 DIAGNOSIS — M810 Age-related osteoporosis without current pathological fracture: Secondary | ICD-10-CM | POA: Diagnosis not present

## 2018-12-01 DIAGNOSIS — E039 Hypothyroidism, unspecified: Secondary | ICD-10-CM | POA: Diagnosis not present

## 2018-12-02 DIAGNOSIS — E039 Hypothyroidism, unspecified: Secondary | ICD-10-CM | POA: Diagnosis not present

## 2018-12-02 DIAGNOSIS — W19XXXD Unspecified fall, subsequent encounter: Secondary | ICD-10-CM | POA: Diagnosis not present

## 2018-12-02 DIAGNOSIS — R2681 Unsteadiness on feet: Secondary | ICD-10-CM | POA: Diagnosis not present

## 2018-12-02 DIAGNOSIS — M810 Age-related osteoporosis without current pathological fracture: Secondary | ICD-10-CM | POA: Diagnosis not present

## 2018-12-02 DIAGNOSIS — M6281 Muscle weakness (generalized): Secondary | ICD-10-CM | POA: Diagnosis not present

## 2018-12-02 DIAGNOSIS — S42201D Unspecified fracture of upper end of right humerus, subsequent encounter for fracture with routine healing: Secondary | ICD-10-CM | POA: Diagnosis not present

## 2018-12-06 DIAGNOSIS — W19XXXD Unspecified fall, subsequent encounter: Secondary | ICD-10-CM | POA: Diagnosis not present

## 2018-12-06 DIAGNOSIS — S42201D Unspecified fracture of upper end of right humerus, subsequent encounter for fracture with routine healing: Secondary | ICD-10-CM | POA: Diagnosis not present

## 2018-12-06 DIAGNOSIS — E039 Hypothyroidism, unspecified: Secondary | ICD-10-CM | POA: Diagnosis not present

## 2018-12-06 DIAGNOSIS — M810 Age-related osteoporosis without current pathological fracture: Secondary | ICD-10-CM | POA: Diagnosis not present

## 2018-12-06 DIAGNOSIS — R2681 Unsteadiness on feet: Secondary | ICD-10-CM | POA: Diagnosis not present

## 2018-12-06 DIAGNOSIS — M6281 Muscle weakness (generalized): Secondary | ICD-10-CM | POA: Diagnosis not present

## 2018-12-08 DIAGNOSIS — M6281 Muscle weakness (generalized): Secondary | ICD-10-CM | POA: Diagnosis not present

## 2018-12-08 DIAGNOSIS — W19XXXD Unspecified fall, subsequent encounter: Secondary | ICD-10-CM | POA: Diagnosis not present

## 2018-12-08 DIAGNOSIS — M810 Age-related osteoporosis without current pathological fracture: Secondary | ICD-10-CM | POA: Diagnosis not present

## 2018-12-08 DIAGNOSIS — S42201D Unspecified fracture of upper end of right humerus, subsequent encounter for fracture with routine healing: Secondary | ICD-10-CM | POA: Diagnosis not present

## 2018-12-08 DIAGNOSIS — E039 Hypothyroidism, unspecified: Secondary | ICD-10-CM | POA: Diagnosis not present

## 2018-12-08 DIAGNOSIS — R2681 Unsteadiness on feet: Secondary | ICD-10-CM | POA: Diagnosis not present

## 2018-12-16 DIAGNOSIS — R2681 Unsteadiness on feet: Secondary | ICD-10-CM | POA: Diagnosis not present

## 2018-12-16 DIAGNOSIS — M6281 Muscle weakness (generalized): Secondary | ICD-10-CM | POA: Diagnosis not present

## 2018-12-16 DIAGNOSIS — M810 Age-related osteoporosis without current pathological fracture: Secondary | ICD-10-CM | POA: Diagnosis not present

## 2018-12-16 DIAGNOSIS — S42201D Unspecified fracture of upper end of right humerus, subsequent encounter for fracture with routine healing: Secondary | ICD-10-CM | POA: Diagnosis not present

## 2018-12-16 DIAGNOSIS — W19XXXD Unspecified fall, subsequent encounter: Secondary | ICD-10-CM | POA: Diagnosis not present

## 2018-12-16 DIAGNOSIS — E039 Hypothyroidism, unspecified: Secondary | ICD-10-CM | POA: Diagnosis not present

## 2019-01-25 DIAGNOSIS — E039 Hypothyroidism, unspecified: Secondary | ICD-10-CM | POA: Diagnosis not present

## 2019-01-25 DIAGNOSIS — D509 Iron deficiency anemia, unspecified: Secondary | ICD-10-CM | POA: Diagnosis not present

## 2019-01-25 DIAGNOSIS — M810 Age-related osteoporosis without current pathological fracture: Secondary | ICD-10-CM | POA: Diagnosis not present

## 2019-01-25 DIAGNOSIS — J45909 Unspecified asthma, uncomplicated: Secondary | ICD-10-CM | POA: Diagnosis not present

## 2019-02-11 DIAGNOSIS — K117 Disturbances of salivary secretion: Secondary | ICD-10-CM | POA: Diagnosis not present

## 2019-02-11 DIAGNOSIS — R05 Cough: Secondary | ICD-10-CM | POA: Diagnosis not present

## 2019-02-11 DIAGNOSIS — R1310 Dysphagia, unspecified: Secondary | ICD-10-CM | POA: Diagnosis not present

## 2019-06-06 IMAGING — CT CT HEAD CODE STROKE
3 series · 15 of 47 positions shown, 18 images · non-contrast
Comparison: Head CT without contrast 10/31/2017. Brain MRI
05/10/2014

CLINICAL DATA: Code stroke. [AGE] female with difficulty
swallowing.

EXAM:
CT HEAD WITHOUT CONTRAST
TECHNIQUE: Contiguous axial images were obtained from the base of the skull
through the vertex without intravenous contrast.

[Series 3: head 5.0 st · axial · 0.43mm/px · z∈[-88,+47]mm · 9 of 33 slices shown, 12 images]
[im 3/33  brain]
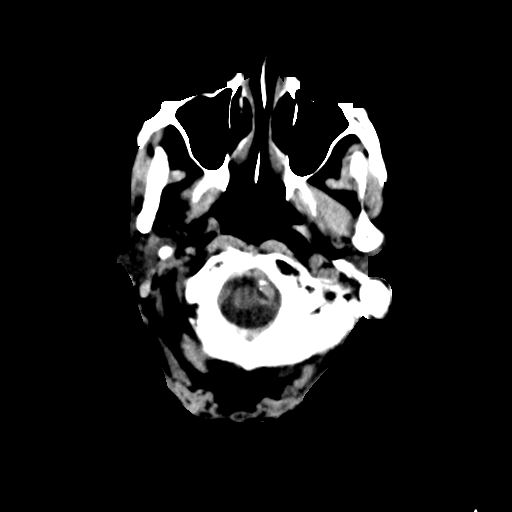
[im 3/33  bone]
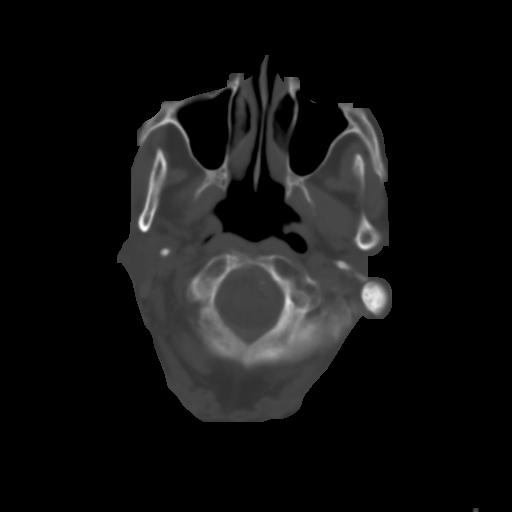
[im 6/33  brain]
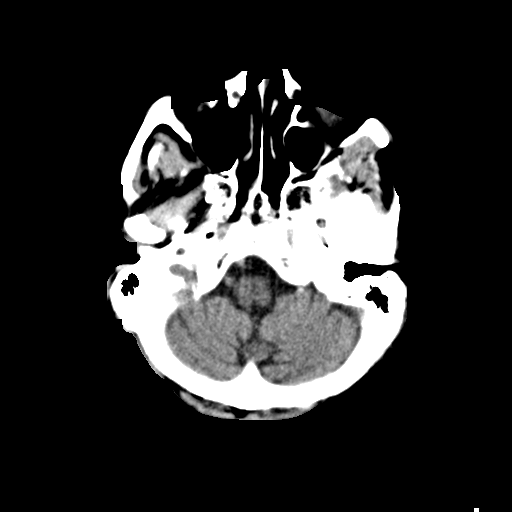
[im 9/33  brain]
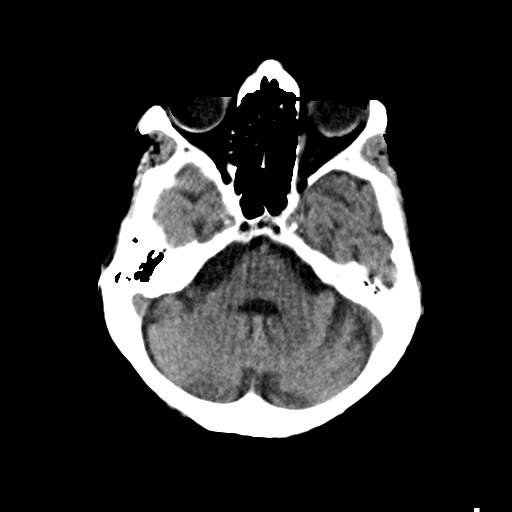
[im 13/33  brain]
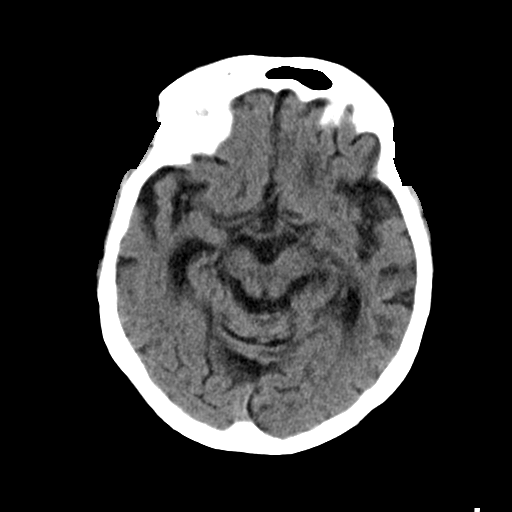
[im 17/33  brain]
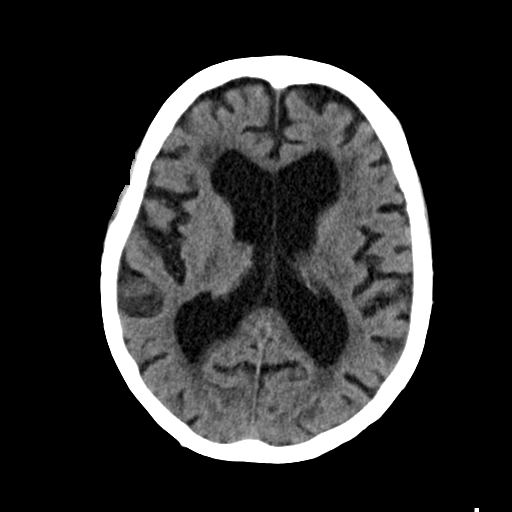
[im 17/33  bone]
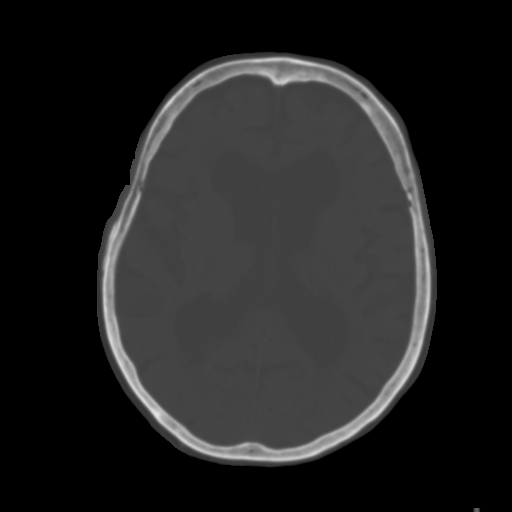
[im 20/33  brain]
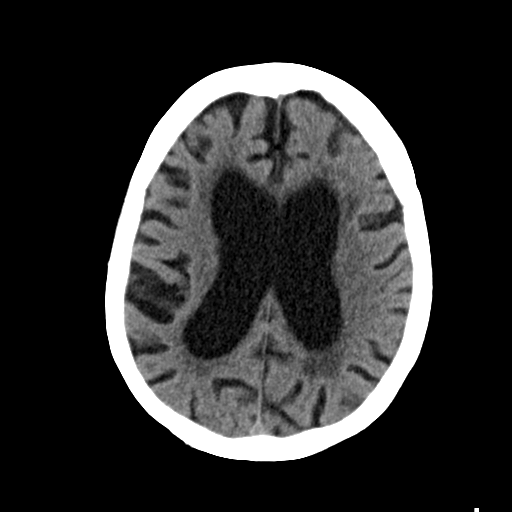
[im 24/33  brain]
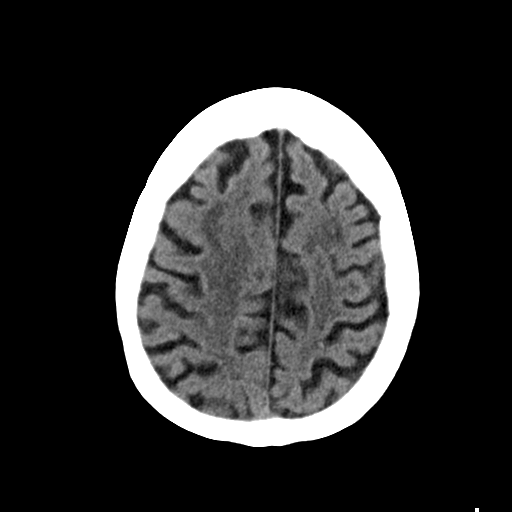
[im 27/33  brain]
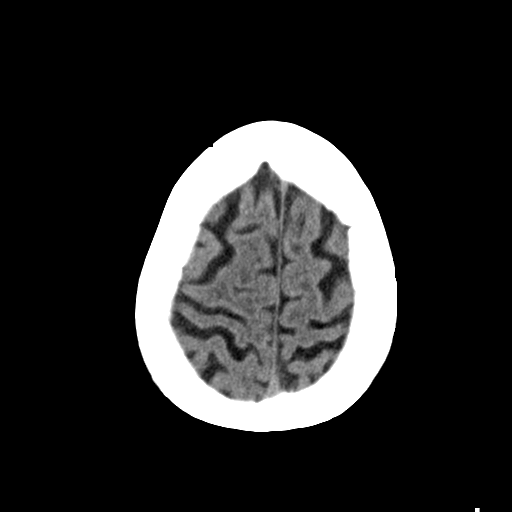
[im 30/33  brain]
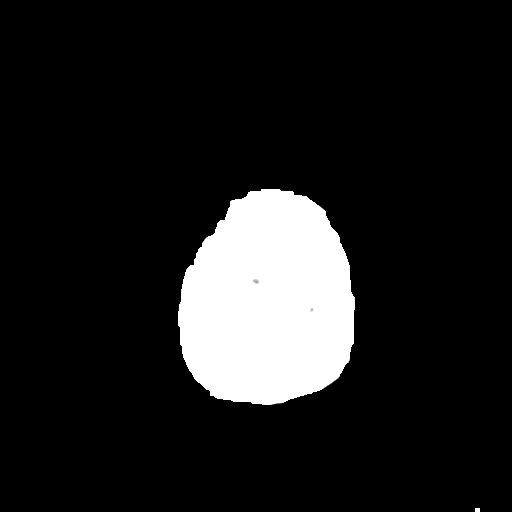
[im 30/33  bone]
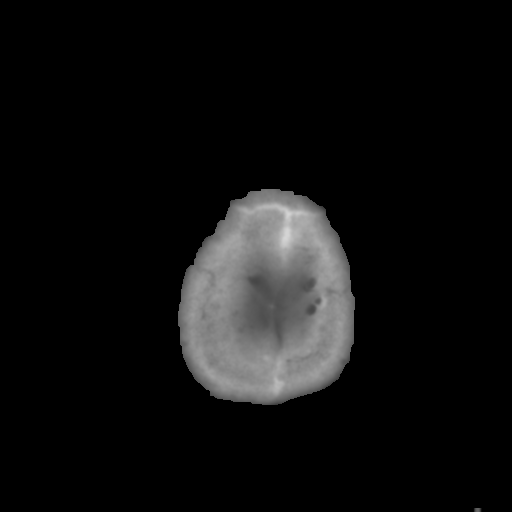

[Series 5: head 3.0 cor st · coronal · 0.32mm/px · 3 of 61 slices shown]
[im 21/61  brain]
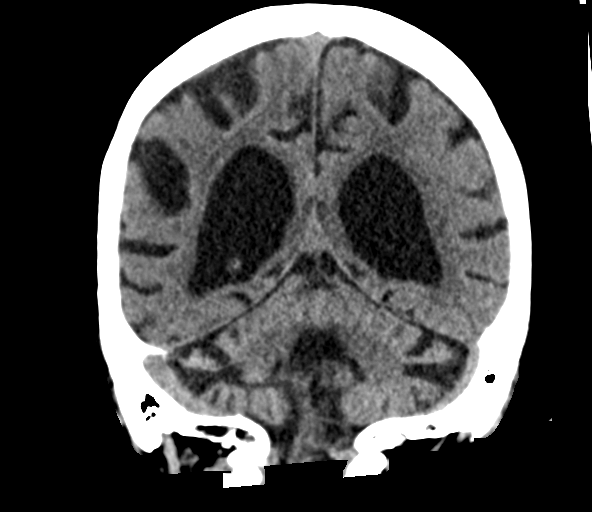
[im 27/61  brain]
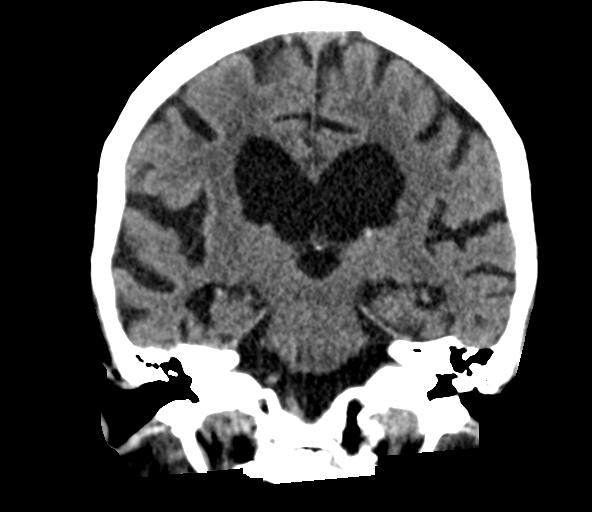
[im 34/61  brain]
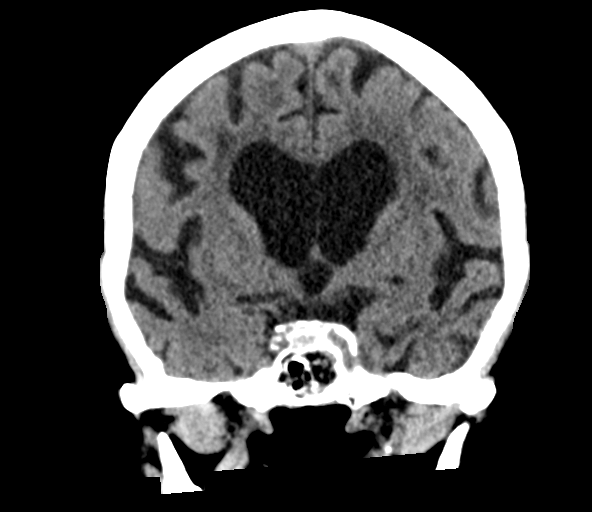

[Series 6: head 3.0 sag st · sagittal · 0.32mm/px · 3 of 52 slices shown]
[im 18/52  brain]
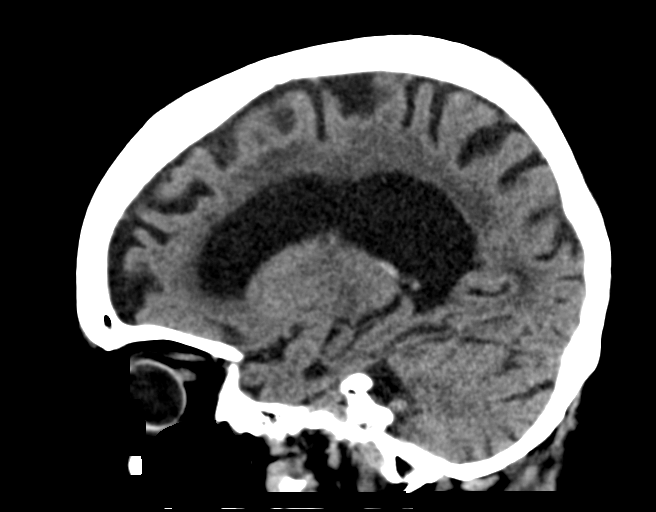
[im 26/52  brain]
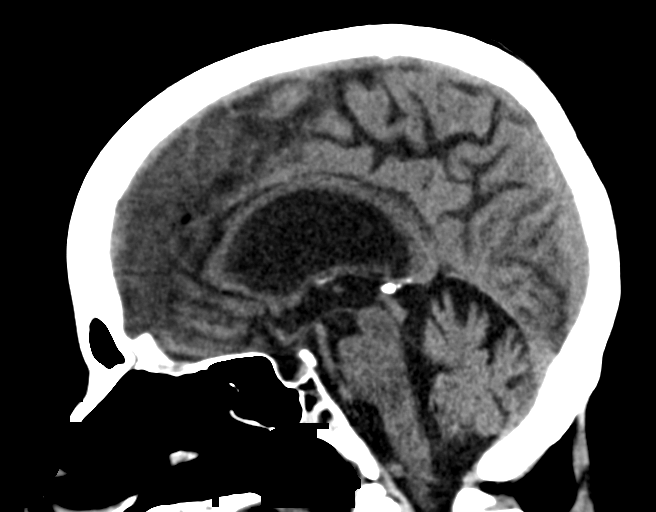
[im 35/52  brain]
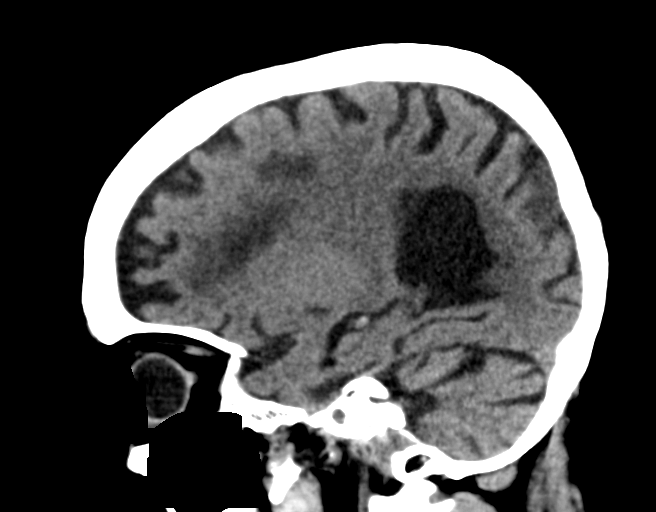

[15 of 47 positions shown; findings below may reference images not displayed]

FINDINGS: Brain: Stable cerebral volume. Posterior fossa gray-white matter
differentiation appears stable since [REDACTED]. Stable gray-white
matter differentiation elsewhere with mild for age patchy cerebral
white matter hypodensity.

No midline shift, ventriculomegaly, mass effect, evidence of mass
lesion, intracranial hemorrhage or evidence of cortically based
acute infarction. No cortical encephalomalacia identified.

Vascular: Calcified atherosclerosis at the skull base. No suspicious
intracranial vascular hyperdensity.

Skull: Stable.  No acute osseous abnormality identified.

Sinuses/Orbits: Visualized paranasal sinuses and mastoids are stable
and well pneumatized.

Other: No acute orbit or scalp soft tissue findings.

ASPECTS ([facility] Stroke Program Early CT Score)

- Ganglionic level infarction (caudate, lentiform nuclei, internal
capsule, insula, M1-M3 cortex): 7

- Supraganglionic infarction (M4-M6 cortex): 3

Total score (0-10 with 10 being normal): 10
IMPRESSION: 1. No acute intracranial abnormality and stable non contrast CT
appearance of the brain since [DATE]. ASPECTS is 10.
3. These results were communicated to Dr. Anahid at [DATE] pmon
12/11/2017by text page via the [employer] messaging system.

## 2019-06-06 IMAGING — CT CT ANGIO NECK
2 of 8 series · 8 of 36 positions shown · IV contrast (OMNI 350)
Comparison: Head CT without contrast 2401 hours today. Brain MRI
05/10/2014. CT Abdomen and Pelvis 06/29/2013.

CLINICAL DATA: [AGE] female with difficulty swallowing. Code
stroke. Possible brainstem infarct. Recent weight loss.

EXAM:
CT ANGIOGRAPHY HEAD AND NECK
TECHNIQUE: Multidetector CT imaging of the head and neck was performed using
the standard protocol during bolus administration of intravenous
contrast. Multiplanar CT image reconstructions and MIPs were
obtained to evaluate the vascular anatomy. Carotid stenosis
measurements (when applicable) are obtained utilizing NASCET
criteria, using the distal internal carotid diameter as the
denominator.
CONTRAST:  50mL [id number] IOPAMIDOL ([id number]) INJECTION 76%

[Series 7: cta neck axial · axial · 0.39mm/px · z∈[-259,-28]mm · 6 of 331 slices shown]
[im 48/331  soft-tissue]
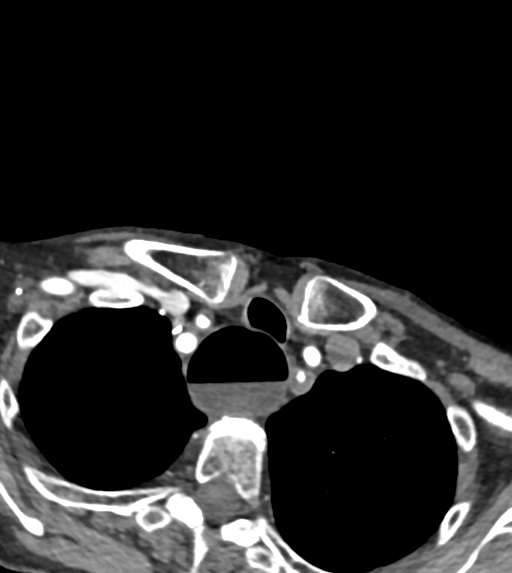
[im 95/331  bone]
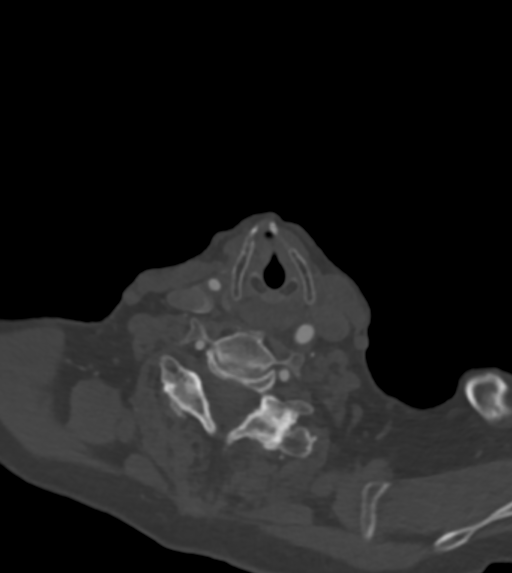
[im 142/331  soft-tissue]
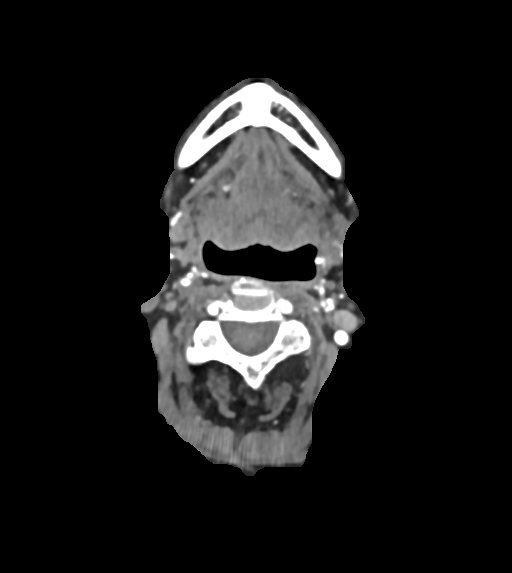
[im 189/331  bone]
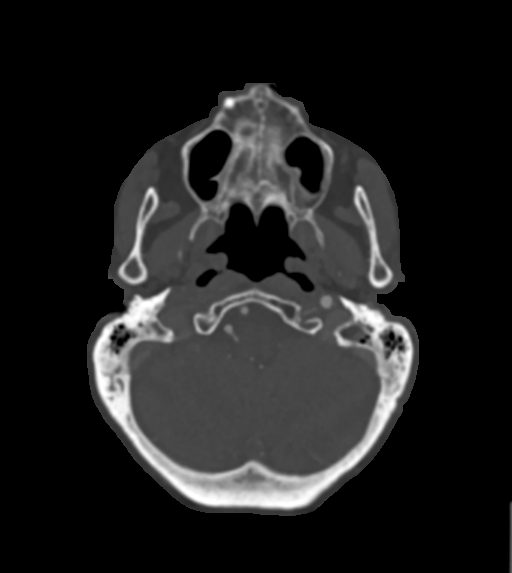
[im 236/331  soft-tissue]
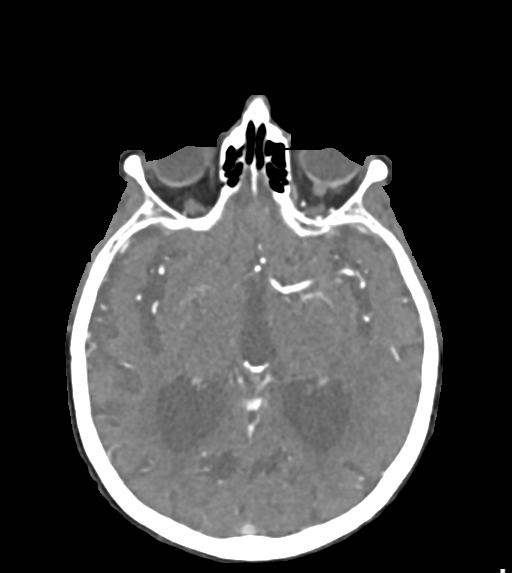
[im 283/331  bone]
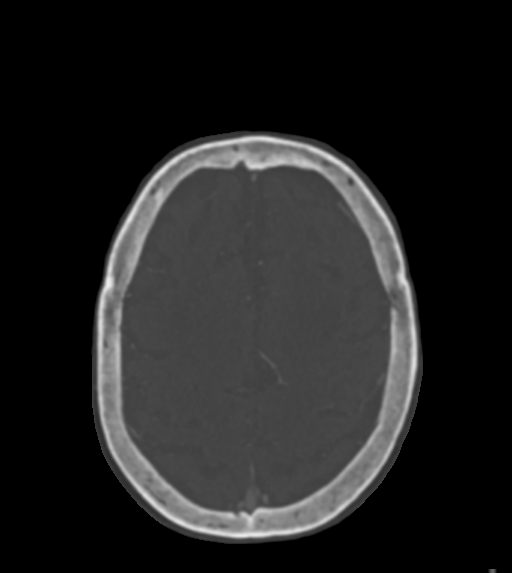

[Series 9: cta neck sagittal · sagittal · 0.50mm/px · 2 of 154 slices shown]
[im 38/154  soft-tissue]
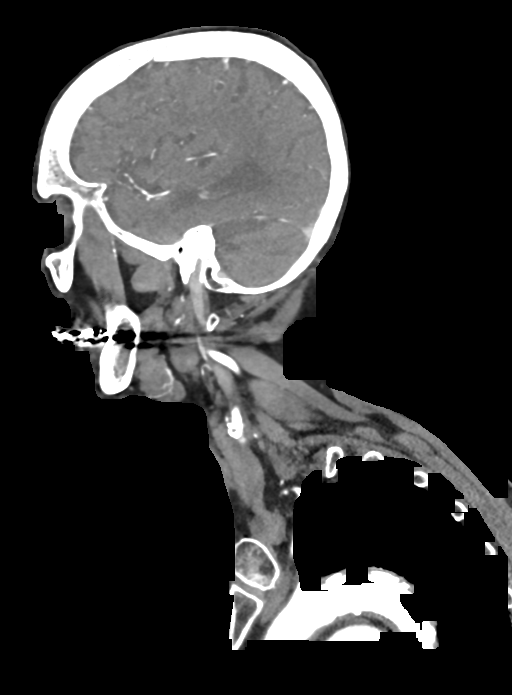
[im 110/154  soft-tissue]
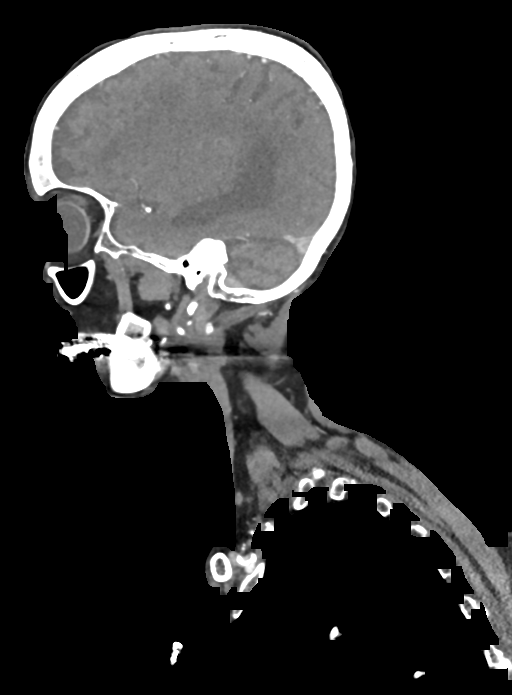

[8 of 36 positions shown; findings below may reference images not displayed]

FINDINGS: CTA NECK

Skeleton: Advanced cervical spine degeneration. C3-C4 ankylosis.
Upper thoracic spine degeneration. No acute osseous abnormality
identified.

Upper chest: Dilated thoracic esophagus up to 4.2 centimeters
diameter with an air-fluid level of the thoracic inlet (series 5,
image 143). The visible esophagus remains dilated to below the level
of the carina. The distal esophagus on 06/29/2013 appear normal.

Negative upper lungs.  No superior mediastinal lymphadenopathy.

Other neck: Negative.  No neck mass or lymphadenopathy.

Aortic arch: Calcified aortic atherosclerosis. Three vessel arch
configuration.

Right carotid system: No brachiocephalic or right CCA origin
stenosis despite atherosclerosis. There is additional soft plaque in
the right CCA, and bulky calcified plaque at the right carotid
bifurcation. The right ICA is chronically occluded, unchanged since
the 0292 brain MRI. No reconstituted flow in the neck.

Left carotid system: No left CCA origin stenosis despite
atherosclerosis. At the left carotid bifurcation there is soft and
calcified plaque with proximal left ICA stenosis numerically
estimated at 60-65 % with respect to the distal vessel (series 8,
image 87). The left ICA remains patent to the skull base without
additional stenosis.

The left ECA origin is incidentally occluded at its origin, with
reconstituted flow in left ECA branches.

Vertebral arteries:
No proximal right subclavian artery stenosis despite plaque.
Calcified plaque at the right vertebral artery origin with moderate
to severe stenosis. The right vertebral then is patent to the skull
base without additional stenosis.

There is bulky soft plaque in the proximal left subclavian artery
resulting in high-grade stenosis numerically estimated at 65-70 %
with respect to the distal vessel. The left subclavian remains
patent, and the left vertebral artery origin is patent without
stenosis despite mild calcified plaque. The left vertebral artery is
patent to the skull base without additional stenosis. Intermittent
left vertebral tortuosity.

CTA HEAD

Posterior circulation: The distal vertebral arteries are patent.
Both PICA origins are patent. There is mild left V4 segment plaque
without stenosis. No right V4 segment stenosis. Patent
vertebrobasilar junction. Patent basilar artery without plaque or
stenosis. SCA and PCA origins are patent and within normal limits.
Both posterior communicating arteries are present. The left PCA
branches are within normal limits. There is moderate irregularity
and stenosis in the right PCA P2 and P3 segments which appears due
to atherosclerosis.

Anterior circulation: The proximal right ICA siphon is occluded. The
right ICA terminus appears reconstituted from the right posterior
communicating artery. The left ICA siphon is patent without stenosis
despite calcified plaque. Normal left ophthalmic and posterior
communicating artery origins.

Both carotid termini are patent. Both MCA and ACA origins are
patent. The anterior communicating artery and bilateral ACA branches
are within normal limits. The proximal left MCA M1 segment is normal
but distally there is moderate to severe irregularity and stenosis
(series 11, image 20). The left MCA trifurcation remains patent.
There is only mild irregularity of left MCA branches. The right MCA
proximal M1 segment is mildly irregular and stenotic. The distal
right M1 appears normal. The right MCA bifurcation is patent. The
right MCA branches are within normal limits.

Venous sinuses: Patent on the delayed images.

Anatomic variants: None.

Delayed phase: No abnormal enhancement identified. Stable gray-white
matter differentiation throughout the brain.

Review of the MIP images confirms the above findings
IMPRESSION: 1. Negative for emergent large vessel occlusion. Patent basilar
artery without atherosclerosis or stenosis.
2. Positive for atherosclerosis with significant stenosis related to
the intracranial circulation:
- Left ICA origin moderate to severe stenosis (60-65%).
- moderate to severe irregularity and stenosis in the distal Left M1
segment and trifurcation.
- moderate to severe Right vertebral artery origin stenosis.
- severe proximal Left subclavian artery stenosis due to soft
plaque, stenosis (65-70%).
3. Chronic occlusion of the right ICA, stable since 0292, with
reconstitution of the right ICA terminus from the right posterior
communicating artery.
4. Dilated and obstructed appearing proximal thoracic esophagus.
Consider benign versus malignant distal esophageal obstruction.
5. Stable CT appearance of the brain since 2401 hours today.

Salient findings were discussed by telephone with Dr. REHENUMA UY
on 12/11/2017 beginning at 1731 hours.

## 2019-06-06 IMAGING — DX DG CHEST 2V
2 series · 2 of 2 positions shown · non-contrast
Comparison: 10/31/2017, 07/13/2012

CLINICAL DATA: Dysphagia, short of breath

EXAM:
CHEST - 2 VIEW

[chest lat]
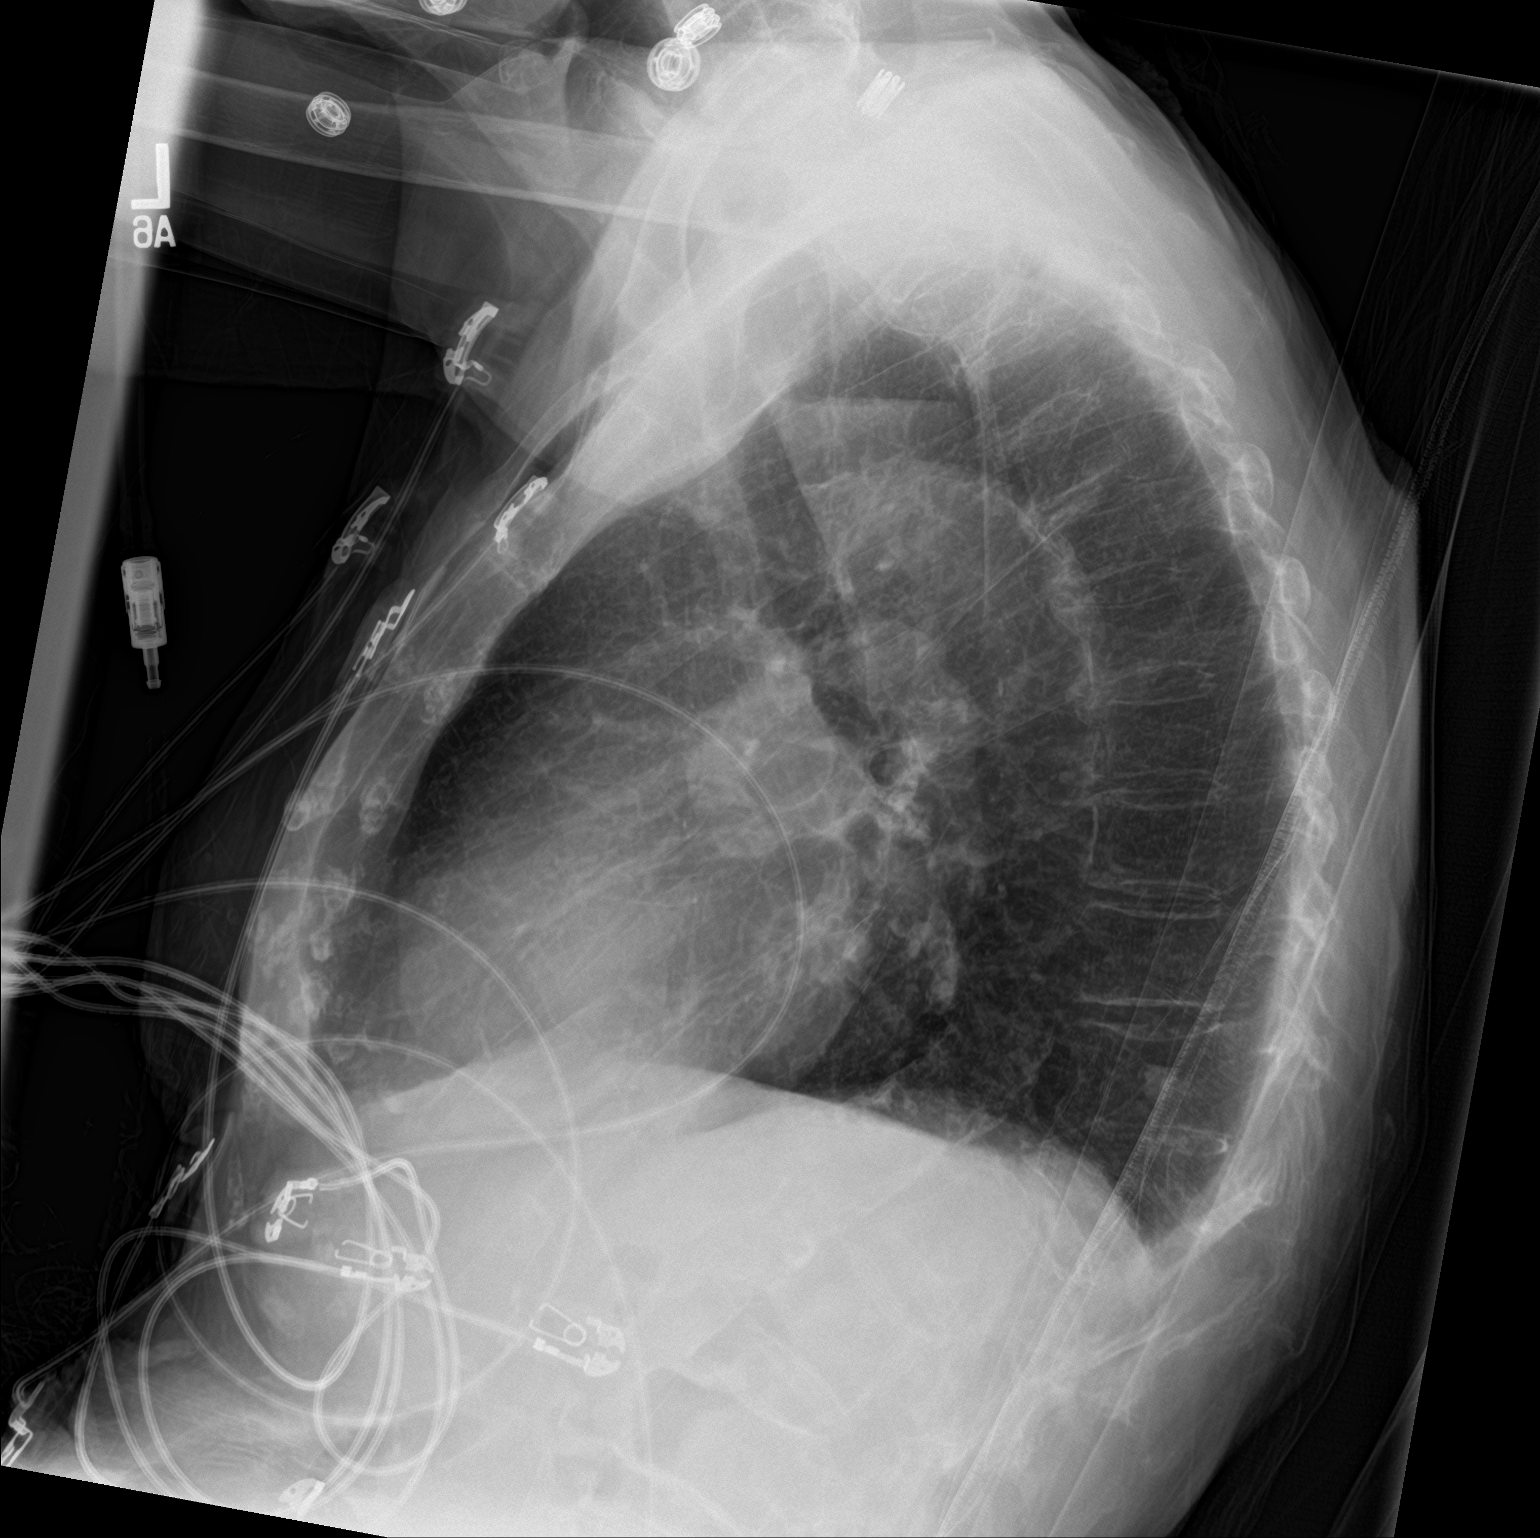

[chest ap]
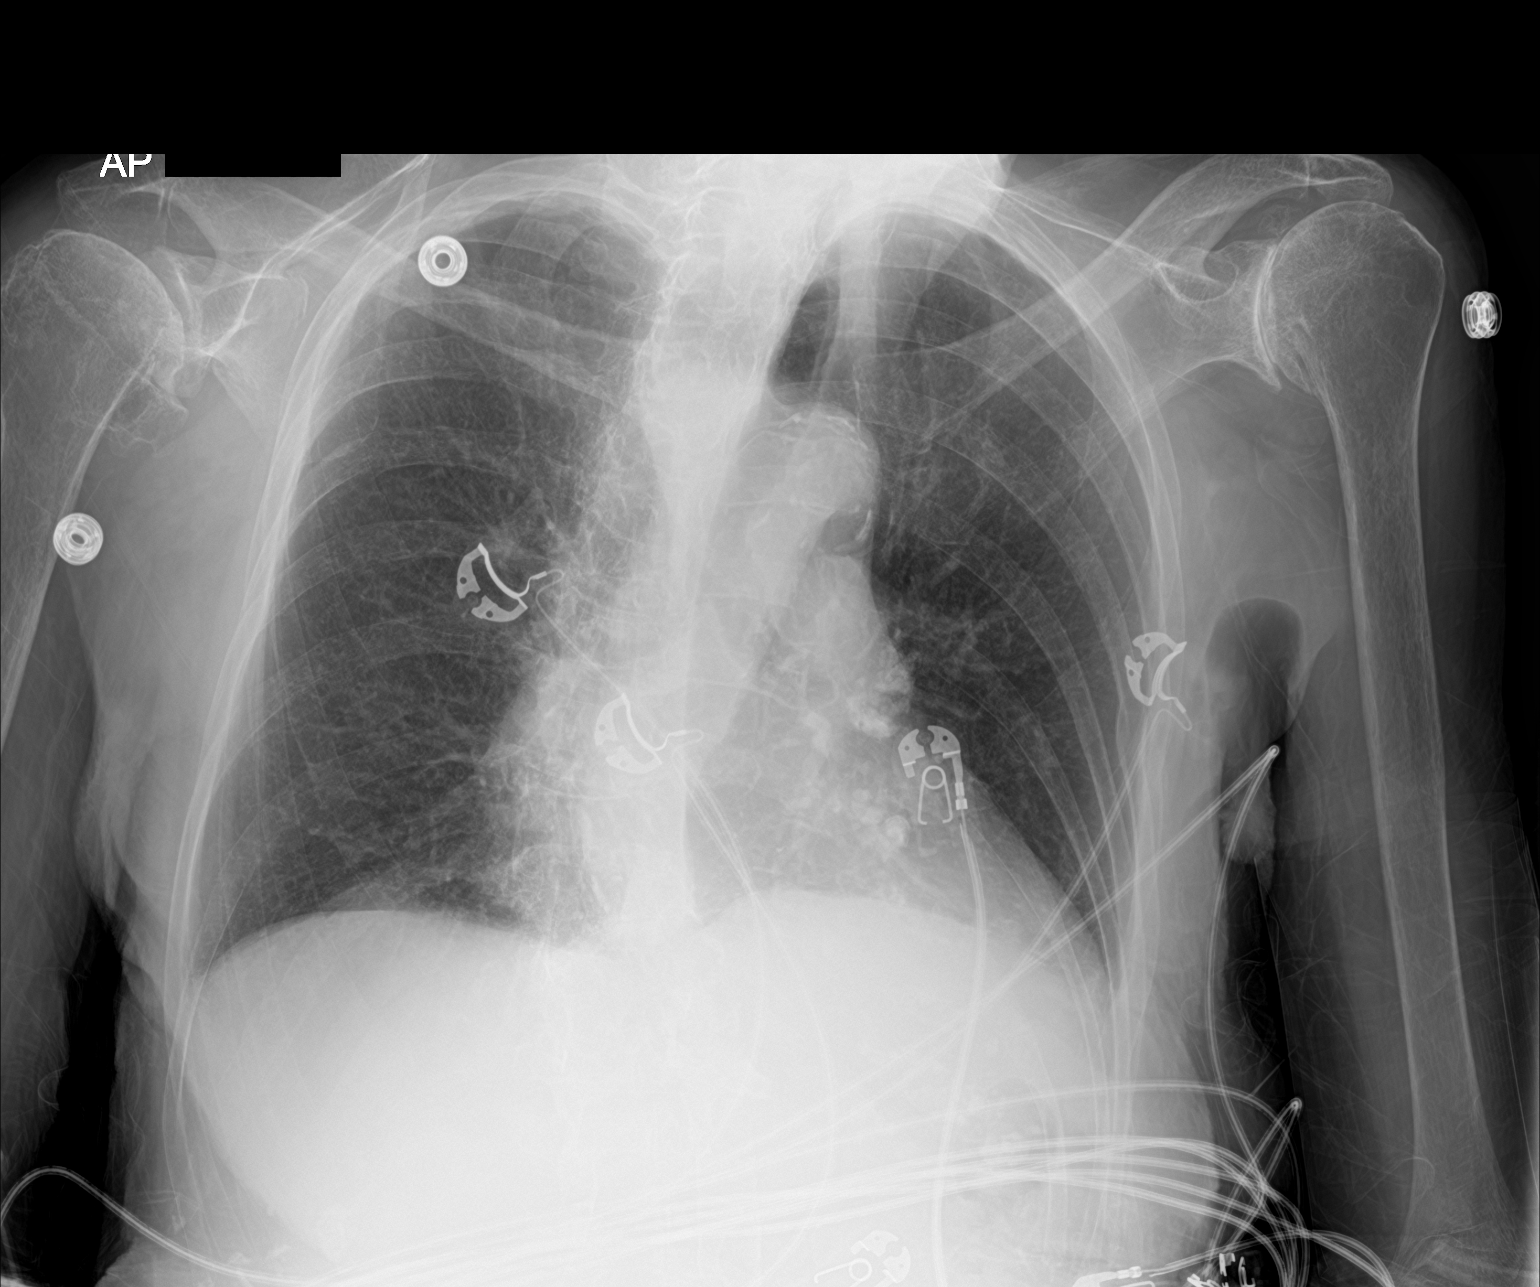

[2 of 2 positions shown; findings below may reference images not displayed]

FINDINGS: No focal pulmonary opacity or significant effusion. Stable mild
cardiomegaly with aortic atherosclerosis. No pneumothorax. Gibbus
deformity at the thoracolumbar junction with marked compression
deformities, uncertain age but new since 6863. Calcified left hilar
nodes.
IMPRESSION: 1. Mild cardiomegaly without edema or infiltrate.
2. Kyphotic deformity at the thoracolumbar junction with marked
compression deformities.

## 2019-08-08 DIAGNOSIS — N390 Urinary tract infection, site not specified: Secondary | ICD-10-CM | POA: Diagnosis not present

## 2019-08-08 DIAGNOSIS — R1310 Dysphagia, unspecified: Secondary | ICD-10-CM | POA: Diagnosis not present

## 2019-08-08 DIAGNOSIS — D509 Iron deficiency anemia, unspecified: Secondary | ICD-10-CM | POA: Diagnosis not present

## 2019-08-08 DIAGNOSIS — Z7189 Other specified counseling: Secondary | ICD-10-CM | POA: Diagnosis not present

## 2019-08-08 DIAGNOSIS — J45909 Unspecified asthma, uncomplicated: Secondary | ICD-10-CM | POA: Diagnosis not present

## 2019-08-08 DIAGNOSIS — K117 Disturbances of salivary secretion: Secondary | ICD-10-CM | POA: Diagnosis not present

## 2019-08-08 DIAGNOSIS — R634 Abnormal weight loss: Secondary | ICD-10-CM | POA: Diagnosis not present

## 2019-08-08 DIAGNOSIS — E039 Hypothyroidism, unspecified: Secondary | ICD-10-CM | POA: Diagnosis not present

## 2019-09-02 DIAGNOSIS — E038 Other specified hypothyroidism: Secondary | ICD-10-CM | POA: Diagnosis not present

## 2019-12-09 DIAGNOSIS — R1310 Dysphagia, unspecified: Secondary | ICD-10-CM | POA: Diagnosis not present

## 2019-12-09 DIAGNOSIS — E039 Hypothyroidism, unspecified: Secondary | ICD-10-CM | POA: Diagnosis not present

## 2019-12-09 DIAGNOSIS — F0390 Unspecified dementia without behavioral disturbance: Secondary | ICD-10-CM | POA: Diagnosis not present

## 2019-12-09 DIAGNOSIS — N3281 Overactive bladder: Secondary | ICD-10-CM | POA: Diagnosis not present

## 2019-12-09 DIAGNOSIS — Z87440 Personal history of urinary (tract) infections: Secondary | ICD-10-CM | POA: Diagnosis not present

## 2019-12-09 DIAGNOSIS — D509 Iron deficiency anemia, unspecified: Secondary | ICD-10-CM | POA: Diagnosis not present

## 2019-12-09 DIAGNOSIS — I6529 Occlusion and stenosis of unspecified carotid artery: Secondary | ICD-10-CM | POA: Diagnosis not present

## 2019-12-09 DIAGNOSIS — M810 Age-related osteoporosis without current pathological fracture: Secondary | ICD-10-CM | POA: Diagnosis not present

## 2019-12-09 DIAGNOSIS — E43 Unspecified severe protein-calorie malnutrition: Secondary | ICD-10-CM | POA: Diagnosis not present

## 2019-12-09 DIAGNOSIS — J45909 Unspecified asthma, uncomplicated: Secondary | ICD-10-CM | POA: Diagnosis not present

## 2019-12-09 DIAGNOSIS — I959 Hypotension, unspecified: Secondary | ICD-10-CM | POA: Diagnosis not present

## 2019-12-13 DIAGNOSIS — R1310 Dysphagia, unspecified: Secondary | ICD-10-CM | POA: Diagnosis not present

## 2019-12-13 DIAGNOSIS — F0390 Unspecified dementia without behavioral disturbance: Secondary | ICD-10-CM | POA: Diagnosis not present

## 2019-12-13 DIAGNOSIS — J45909 Unspecified asthma, uncomplicated: Secondary | ICD-10-CM | POA: Diagnosis not present

## 2019-12-13 DIAGNOSIS — I959 Hypotension, unspecified: Secondary | ICD-10-CM | POA: Diagnosis not present

## 2019-12-13 DIAGNOSIS — N3281 Overactive bladder: Secondary | ICD-10-CM | POA: Diagnosis not present

## 2019-12-13 DIAGNOSIS — E43 Unspecified severe protein-calorie malnutrition: Secondary | ICD-10-CM | POA: Diagnosis not present

## 2019-12-22 DIAGNOSIS — F0390 Unspecified dementia without behavioral disturbance: Secondary | ICD-10-CM | POA: Diagnosis not present

## 2019-12-22 DIAGNOSIS — E039 Hypothyroidism, unspecified: Secondary | ICD-10-CM | POA: Diagnosis not present

## 2019-12-22 DIAGNOSIS — M810 Age-related osteoporosis without current pathological fracture: Secondary | ICD-10-CM | POA: Diagnosis not present

## 2019-12-22 DIAGNOSIS — I959 Hypotension, unspecified: Secondary | ICD-10-CM | POA: Diagnosis not present

## 2019-12-22 DIAGNOSIS — N3281 Overactive bladder: Secondary | ICD-10-CM | POA: Diagnosis not present

## 2019-12-22 DIAGNOSIS — R1310 Dysphagia, unspecified: Secondary | ICD-10-CM | POA: Diagnosis not present

## 2019-12-22 DIAGNOSIS — J45909 Unspecified asthma, uncomplicated: Secondary | ICD-10-CM | POA: Diagnosis not present

## 2019-12-22 DIAGNOSIS — I6529 Occlusion and stenosis of unspecified carotid artery: Secondary | ICD-10-CM | POA: Diagnosis not present

## 2019-12-22 DIAGNOSIS — Z87440 Personal history of urinary (tract) infections: Secondary | ICD-10-CM | POA: Diagnosis not present

## 2019-12-22 DIAGNOSIS — D509 Iron deficiency anemia, unspecified: Secondary | ICD-10-CM | POA: Diagnosis not present

## 2019-12-22 DIAGNOSIS — E43 Unspecified severe protein-calorie malnutrition: Secondary | ICD-10-CM | POA: Diagnosis not present

## 2019-12-23 DIAGNOSIS — I959 Hypotension, unspecified: Secondary | ICD-10-CM | POA: Diagnosis not present

## 2019-12-23 DIAGNOSIS — E43 Unspecified severe protein-calorie malnutrition: Secondary | ICD-10-CM | POA: Diagnosis not present

## 2019-12-23 DIAGNOSIS — R1310 Dysphagia, unspecified: Secondary | ICD-10-CM | POA: Diagnosis not present

## 2019-12-23 DIAGNOSIS — N3281 Overactive bladder: Secondary | ICD-10-CM | POA: Diagnosis not present

## 2019-12-23 DIAGNOSIS — J45909 Unspecified asthma, uncomplicated: Secondary | ICD-10-CM | POA: Diagnosis not present

## 2019-12-23 DIAGNOSIS — F0390 Unspecified dementia without behavioral disturbance: Secondary | ICD-10-CM | POA: Diagnosis not present

## 2019-12-29 DIAGNOSIS — N3281 Overactive bladder: Secondary | ICD-10-CM | POA: Diagnosis not present

## 2019-12-29 DIAGNOSIS — F0390 Unspecified dementia without behavioral disturbance: Secondary | ICD-10-CM | POA: Diagnosis not present

## 2019-12-29 DIAGNOSIS — I959 Hypotension, unspecified: Secondary | ICD-10-CM | POA: Diagnosis not present

## 2019-12-29 DIAGNOSIS — J45909 Unspecified asthma, uncomplicated: Secondary | ICD-10-CM | POA: Diagnosis not present

## 2019-12-29 DIAGNOSIS — E43 Unspecified severe protein-calorie malnutrition: Secondary | ICD-10-CM | POA: Diagnosis not present

## 2019-12-29 DIAGNOSIS — R1310 Dysphagia, unspecified: Secondary | ICD-10-CM | POA: Diagnosis not present

## 2020-01-02 DIAGNOSIS — J45909 Unspecified asthma, uncomplicated: Secondary | ICD-10-CM | POA: Diagnosis not present

## 2020-01-02 DIAGNOSIS — E43 Unspecified severe protein-calorie malnutrition: Secondary | ICD-10-CM | POA: Diagnosis not present

## 2020-01-02 DIAGNOSIS — F0390 Unspecified dementia without behavioral disturbance: Secondary | ICD-10-CM | POA: Diagnosis not present

## 2020-01-02 DIAGNOSIS — I959 Hypotension, unspecified: Secondary | ICD-10-CM | POA: Diagnosis not present

## 2020-01-02 DIAGNOSIS — R1310 Dysphagia, unspecified: Secondary | ICD-10-CM | POA: Diagnosis not present

## 2020-01-02 DIAGNOSIS — N3281 Overactive bladder: Secondary | ICD-10-CM | POA: Diagnosis not present

## 2020-01-05 DIAGNOSIS — E43 Unspecified severe protein-calorie malnutrition: Secondary | ICD-10-CM | POA: Diagnosis not present

## 2020-01-05 DIAGNOSIS — R1310 Dysphagia, unspecified: Secondary | ICD-10-CM | POA: Diagnosis not present

## 2020-01-05 DIAGNOSIS — J45909 Unspecified asthma, uncomplicated: Secondary | ICD-10-CM | POA: Diagnosis not present

## 2020-01-05 DIAGNOSIS — I959 Hypotension, unspecified: Secondary | ICD-10-CM | POA: Diagnosis not present

## 2020-01-05 DIAGNOSIS — N3281 Overactive bladder: Secondary | ICD-10-CM | POA: Diagnosis not present

## 2020-01-05 DIAGNOSIS — F0390 Unspecified dementia without behavioral disturbance: Secondary | ICD-10-CM | POA: Diagnosis not present

## 2020-01-12 DIAGNOSIS — J45909 Unspecified asthma, uncomplicated: Secondary | ICD-10-CM | POA: Diagnosis not present

## 2020-01-12 DIAGNOSIS — N3281 Overactive bladder: Secondary | ICD-10-CM | POA: Diagnosis not present

## 2020-01-12 DIAGNOSIS — F0390 Unspecified dementia without behavioral disturbance: Secondary | ICD-10-CM | POA: Diagnosis not present

## 2020-01-12 DIAGNOSIS — E43 Unspecified severe protein-calorie malnutrition: Secondary | ICD-10-CM | POA: Diagnosis not present

## 2020-01-12 DIAGNOSIS — R1310 Dysphagia, unspecified: Secondary | ICD-10-CM | POA: Diagnosis not present

## 2020-01-12 DIAGNOSIS — I959 Hypotension, unspecified: Secondary | ICD-10-CM | POA: Diagnosis not present

## 2020-01-21 DIAGNOSIS — I959 Hypotension, unspecified: Secondary | ICD-10-CM | POA: Diagnosis not present

## 2020-01-21 DIAGNOSIS — Z87440 Personal history of urinary (tract) infections: Secondary | ICD-10-CM | POA: Diagnosis not present

## 2020-01-21 DIAGNOSIS — E039 Hypothyroidism, unspecified: Secondary | ICD-10-CM | POA: Diagnosis not present

## 2020-01-21 DIAGNOSIS — R1310 Dysphagia, unspecified: Secondary | ICD-10-CM | POA: Diagnosis not present

## 2020-01-21 DIAGNOSIS — N3281 Overactive bladder: Secondary | ICD-10-CM | POA: Diagnosis not present

## 2020-01-21 DIAGNOSIS — I6529 Occlusion and stenosis of unspecified carotid artery: Secondary | ICD-10-CM | POA: Diagnosis not present

## 2020-01-21 DIAGNOSIS — M810 Age-related osteoporosis without current pathological fracture: Secondary | ICD-10-CM | POA: Diagnosis not present

## 2020-01-21 DIAGNOSIS — D509 Iron deficiency anemia, unspecified: Secondary | ICD-10-CM | POA: Diagnosis not present

## 2020-01-21 DIAGNOSIS — J45909 Unspecified asthma, uncomplicated: Secondary | ICD-10-CM | POA: Diagnosis not present

## 2020-01-21 DIAGNOSIS — E43 Unspecified severe protein-calorie malnutrition: Secondary | ICD-10-CM | POA: Diagnosis not present

## 2020-01-21 DIAGNOSIS — F0390 Unspecified dementia without behavioral disturbance: Secondary | ICD-10-CM | POA: Diagnosis not present

## 2020-01-30 DIAGNOSIS — R1310 Dysphagia, unspecified: Secondary | ICD-10-CM | POA: Diagnosis not present

## 2020-01-30 DIAGNOSIS — J45909 Unspecified asthma, uncomplicated: Secondary | ICD-10-CM | POA: Diagnosis not present

## 2020-01-30 DIAGNOSIS — I959 Hypotension, unspecified: Secondary | ICD-10-CM | POA: Diagnosis not present

## 2020-01-30 DIAGNOSIS — N3281 Overactive bladder: Secondary | ICD-10-CM | POA: Diagnosis not present

## 2020-01-30 DIAGNOSIS — E43 Unspecified severe protein-calorie malnutrition: Secondary | ICD-10-CM | POA: Diagnosis not present

## 2020-01-30 DIAGNOSIS — F0390 Unspecified dementia without behavioral disturbance: Secondary | ICD-10-CM | POA: Diagnosis not present

## 2020-02-07 DIAGNOSIS — R1310 Dysphagia, unspecified: Secondary | ICD-10-CM | POA: Diagnosis not present

## 2020-02-07 DIAGNOSIS — J45909 Unspecified asthma, uncomplicated: Secondary | ICD-10-CM | POA: Diagnosis not present

## 2020-02-07 DIAGNOSIS — N3281 Overactive bladder: Secondary | ICD-10-CM | POA: Diagnosis not present

## 2020-02-07 DIAGNOSIS — I959 Hypotension, unspecified: Secondary | ICD-10-CM | POA: Diagnosis not present

## 2020-02-07 DIAGNOSIS — F0390 Unspecified dementia without behavioral disturbance: Secondary | ICD-10-CM | POA: Diagnosis not present

## 2020-02-07 DIAGNOSIS — E43 Unspecified severe protein-calorie malnutrition: Secondary | ICD-10-CM | POA: Diagnosis not present

## 2020-02-13 DIAGNOSIS — E43 Unspecified severe protein-calorie malnutrition: Secondary | ICD-10-CM | POA: Diagnosis not present

## 2020-02-13 DIAGNOSIS — F0390 Unspecified dementia without behavioral disturbance: Secondary | ICD-10-CM | POA: Diagnosis not present

## 2020-02-13 DIAGNOSIS — N3281 Overactive bladder: Secondary | ICD-10-CM | POA: Diagnosis not present

## 2020-02-13 DIAGNOSIS — J45909 Unspecified asthma, uncomplicated: Secondary | ICD-10-CM | POA: Diagnosis not present

## 2020-02-13 DIAGNOSIS — R1310 Dysphagia, unspecified: Secondary | ICD-10-CM | POA: Diagnosis not present

## 2020-02-13 DIAGNOSIS — I959 Hypotension, unspecified: Secondary | ICD-10-CM | POA: Diagnosis not present

## 2020-02-17 DIAGNOSIS — I959 Hypotension, unspecified: Secondary | ICD-10-CM | POA: Diagnosis not present

## 2020-02-17 DIAGNOSIS — N3281 Overactive bladder: Secondary | ICD-10-CM | POA: Diagnosis not present

## 2020-02-17 DIAGNOSIS — F0390 Unspecified dementia without behavioral disturbance: Secondary | ICD-10-CM | POA: Diagnosis not present

## 2020-02-17 DIAGNOSIS — J45909 Unspecified asthma, uncomplicated: Secondary | ICD-10-CM | POA: Diagnosis not present

## 2020-02-17 DIAGNOSIS — R1310 Dysphagia, unspecified: Secondary | ICD-10-CM | POA: Diagnosis not present

## 2020-02-17 DIAGNOSIS — E43 Unspecified severe protein-calorie malnutrition: Secondary | ICD-10-CM | POA: Diagnosis not present

## 2020-02-21 DIAGNOSIS — J45909 Unspecified asthma, uncomplicated: Secondary | ICD-10-CM | POA: Diagnosis not present

## 2020-02-21 DIAGNOSIS — E039 Hypothyroidism, unspecified: Secondary | ICD-10-CM | POA: Diagnosis not present

## 2020-02-21 DIAGNOSIS — F0390 Unspecified dementia without behavioral disturbance: Secondary | ICD-10-CM | POA: Diagnosis not present

## 2020-02-21 DIAGNOSIS — E43 Unspecified severe protein-calorie malnutrition: Secondary | ICD-10-CM | POA: Diagnosis not present

## 2020-02-21 DIAGNOSIS — M810 Age-related osteoporosis without current pathological fracture: Secondary | ICD-10-CM | POA: Diagnosis not present

## 2020-02-21 DIAGNOSIS — Z87440 Personal history of urinary (tract) infections: Secondary | ICD-10-CM | POA: Diagnosis not present

## 2020-02-21 DIAGNOSIS — R1310 Dysphagia, unspecified: Secondary | ICD-10-CM | POA: Diagnosis not present

## 2020-02-21 DIAGNOSIS — I959 Hypotension, unspecified: Secondary | ICD-10-CM | POA: Diagnosis not present

## 2020-02-21 DIAGNOSIS — N3281 Overactive bladder: Secondary | ICD-10-CM | POA: Diagnosis not present

## 2020-02-21 DIAGNOSIS — D509 Iron deficiency anemia, unspecified: Secondary | ICD-10-CM | POA: Diagnosis not present

## 2020-02-21 DIAGNOSIS — I6529 Occlusion and stenosis of unspecified carotid artery: Secondary | ICD-10-CM | POA: Diagnosis not present

## 2020-02-27 DIAGNOSIS — I959 Hypotension, unspecified: Secondary | ICD-10-CM | POA: Diagnosis not present

## 2020-02-27 DIAGNOSIS — E43 Unspecified severe protein-calorie malnutrition: Secondary | ICD-10-CM | POA: Diagnosis not present

## 2020-02-27 DIAGNOSIS — N3281 Overactive bladder: Secondary | ICD-10-CM | POA: Diagnosis not present

## 2020-02-27 DIAGNOSIS — R1310 Dysphagia, unspecified: Secondary | ICD-10-CM | POA: Diagnosis not present

## 2020-02-27 DIAGNOSIS — F0390 Unspecified dementia without behavioral disturbance: Secondary | ICD-10-CM | POA: Diagnosis not present

## 2020-02-27 DIAGNOSIS — J45909 Unspecified asthma, uncomplicated: Secondary | ICD-10-CM | POA: Diagnosis not present

## 2020-03-05 DIAGNOSIS — F0390 Unspecified dementia without behavioral disturbance: Secondary | ICD-10-CM | POA: Diagnosis not present

## 2020-03-05 DIAGNOSIS — N3281 Overactive bladder: Secondary | ICD-10-CM | POA: Diagnosis not present

## 2020-03-05 DIAGNOSIS — I959 Hypotension, unspecified: Secondary | ICD-10-CM | POA: Diagnosis not present

## 2020-03-05 DIAGNOSIS — R1310 Dysphagia, unspecified: Secondary | ICD-10-CM | POA: Diagnosis not present

## 2020-03-05 DIAGNOSIS — J45909 Unspecified asthma, uncomplicated: Secondary | ICD-10-CM | POA: Diagnosis not present

## 2020-03-05 DIAGNOSIS — E43 Unspecified severe protein-calorie malnutrition: Secondary | ICD-10-CM | POA: Diagnosis not present

## 2020-03-12 DIAGNOSIS — F0390 Unspecified dementia without behavioral disturbance: Secondary | ICD-10-CM | POA: Diagnosis not present

## 2020-03-12 DIAGNOSIS — N3281 Overactive bladder: Secondary | ICD-10-CM | POA: Diagnosis not present

## 2020-03-12 DIAGNOSIS — J45909 Unspecified asthma, uncomplicated: Secondary | ICD-10-CM | POA: Diagnosis not present

## 2020-03-12 DIAGNOSIS — E43 Unspecified severe protein-calorie malnutrition: Secondary | ICD-10-CM | POA: Diagnosis not present

## 2020-03-12 DIAGNOSIS — I959 Hypotension, unspecified: Secondary | ICD-10-CM | POA: Diagnosis not present

## 2020-03-12 DIAGNOSIS — R1310 Dysphagia, unspecified: Secondary | ICD-10-CM | POA: Diagnosis not present

## 2020-03-19 DIAGNOSIS — F0390 Unspecified dementia without behavioral disturbance: Secondary | ICD-10-CM | POA: Diagnosis not present

## 2020-03-19 DIAGNOSIS — I959 Hypotension, unspecified: Secondary | ICD-10-CM | POA: Diagnosis not present

## 2020-03-19 DIAGNOSIS — J45909 Unspecified asthma, uncomplicated: Secondary | ICD-10-CM | POA: Diagnosis not present

## 2020-03-19 DIAGNOSIS — E43 Unspecified severe protein-calorie malnutrition: Secondary | ICD-10-CM | POA: Diagnosis not present

## 2020-03-19 DIAGNOSIS — R1310 Dysphagia, unspecified: Secondary | ICD-10-CM | POA: Diagnosis not present

## 2020-03-19 DIAGNOSIS — N3281 Overactive bladder: Secondary | ICD-10-CM | POA: Diagnosis not present

## 2020-03-20 DIAGNOSIS — N3281 Overactive bladder: Secondary | ICD-10-CM | POA: Diagnosis not present

## 2020-03-20 DIAGNOSIS — F0390 Unspecified dementia without behavioral disturbance: Secondary | ICD-10-CM | POA: Diagnosis not present

## 2020-03-20 DIAGNOSIS — I959 Hypotension, unspecified: Secondary | ICD-10-CM | POA: Diagnosis not present

## 2020-03-20 DIAGNOSIS — E43 Unspecified severe protein-calorie malnutrition: Secondary | ICD-10-CM | POA: Diagnosis not present

## 2020-03-20 DIAGNOSIS — R1310 Dysphagia, unspecified: Secondary | ICD-10-CM | POA: Diagnosis not present

## 2020-03-20 DIAGNOSIS — J45909 Unspecified asthma, uncomplicated: Secondary | ICD-10-CM | POA: Diagnosis not present

## 2020-03-22 DIAGNOSIS — J45909 Unspecified asthma, uncomplicated: Secondary | ICD-10-CM | POA: Diagnosis not present

## 2020-03-22 DIAGNOSIS — M810 Age-related osteoporosis without current pathological fracture: Secondary | ICD-10-CM | POA: Diagnosis not present

## 2020-03-22 DIAGNOSIS — F0390 Unspecified dementia without behavioral disturbance: Secondary | ICD-10-CM | POA: Diagnosis not present

## 2020-03-22 DIAGNOSIS — N3281 Overactive bladder: Secondary | ICD-10-CM | POA: Diagnosis not present

## 2020-03-22 DIAGNOSIS — R1310 Dysphagia, unspecified: Secondary | ICD-10-CM | POA: Diagnosis not present

## 2020-03-22 DIAGNOSIS — I6529 Occlusion and stenosis of unspecified carotid artery: Secondary | ICD-10-CM | POA: Diagnosis not present

## 2020-03-22 DIAGNOSIS — R32 Unspecified urinary incontinence: Secondary | ICD-10-CM | POA: Diagnosis not present

## 2020-03-22 DIAGNOSIS — R159 Full incontinence of feces: Secondary | ICD-10-CM | POA: Diagnosis not present

## 2020-03-22 DIAGNOSIS — I959 Hypotension, unspecified: Secondary | ICD-10-CM | POA: Diagnosis not present

## 2020-03-22 DIAGNOSIS — Z87440 Personal history of urinary (tract) infections: Secondary | ICD-10-CM | POA: Diagnosis not present

## 2020-03-22 DIAGNOSIS — E43 Unspecified severe protein-calorie malnutrition: Secondary | ICD-10-CM | POA: Diagnosis not present

## 2020-03-22 DIAGNOSIS — D509 Iron deficiency anemia, unspecified: Secondary | ICD-10-CM | POA: Diagnosis not present

## 2020-03-22 DIAGNOSIS — E039 Hypothyroidism, unspecified: Secondary | ICD-10-CM | POA: Diagnosis not present

## 2020-03-26 DIAGNOSIS — R1310 Dysphagia, unspecified: Secondary | ICD-10-CM | POA: Diagnosis not present

## 2020-03-26 DIAGNOSIS — J45909 Unspecified asthma, uncomplicated: Secondary | ICD-10-CM | POA: Diagnosis not present

## 2020-03-26 DIAGNOSIS — E43 Unspecified severe protein-calorie malnutrition: Secondary | ICD-10-CM | POA: Diagnosis not present

## 2020-03-26 DIAGNOSIS — F0390 Unspecified dementia without behavioral disturbance: Secondary | ICD-10-CM | POA: Diagnosis not present

## 2020-03-26 DIAGNOSIS — I959 Hypotension, unspecified: Secondary | ICD-10-CM | POA: Diagnosis not present

## 2020-03-26 DIAGNOSIS — N3281 Overactive bladder: Secondary | ICD-10-CM | POA: Diagnosis not present

## 2020-04-03 DIAGNOSIS — N3281 Overactive bladder: Secondary | ICD-10-CM | POA: Diagnosis not present

## 2020-04-03 DIAGNOSIS — I959 Hypotension, unspecified: Secondary | ICD-10-CM | POA: Diagnosis not present

## 2020-04-03 DIAGNOSIS — R1310 Dysphagia, unspecified: Secondary | ICD-10-CM | POA: Diagnosis not present

## 2020-04-03 DIAGNOSIS — F0390 Unspecified dementia without behavioral disturbance: Secondary | ICD-10-CM | POA: Diagnosis not present

## 2020-04-03 DIAGNOSIS — J45909 Unspecified asthma, uncomplicated: Secondary | ICD-10-CM | POA: Diagnosis not present

## 2020-04-03 DIAGNOSIS — E43 Unspecified severe protein-calorie malnutrition: Secondary | ICD-10-CM | POA: Diagnosis not present

## 2020-04-10 DIAGNOSIS — E43 Unspecified severe protein-calorie malnutrition: Secondary | ICD-10-CM | POA: Diagnosis not present

## 2020-04-10 DIAGNOSIS — I959 Hypotension, unspecified: Secondary | ICD-10-CM | POA: Diagnosis not present

## 2020-04-10 DIAGNOSIS — R1310 Dysphagia, unspecified: Secondary | ICD-10-CM | POA: Diagnosis not present

## 2020-04-10 DIAGNOSIS — F0390 Unspecified dementia without behavioral disturbance: Secondary | ICD-10-CM | POA: Diagnosis not present

## 2020-04-10 DIAGNOSIS — N3281 Overactive bladder: Secondary | ICD-10-CM | POA: Diagnosis not present

## 2020-04-10 DIAGNOSIS — J45909 Unspecified asthma, uncomplicated: Secondary | ICD-10-CM | POA: Diagnosis not present

## 2020-04-16 DIAGNOSIS — R1310 Dysphagia, unspecified: Secondary | ICD-10-CM | POA: Diagnosis not present

## 2020-04-16 DIAGNOSIS — N3281 Overactive bladder: Secondary | ICD-10-CM | POA: Diagnosis not present

## 2020-04-16 DIAGNOSIS — F0390 Unspecified dementia without behavioral disturbance: Secondary | ICD-10-CM | POA: Diagnosis not present

## 2020-04-16 DIAGNOSIS — J45909 Unspecified asthma, uncomplicated: Secondary | ICD-10-CM | POA: Diagnosis not present

## 2020-04-16 DIAGNOSIS — I959 Hypotension, unspecified: Secondary | ICD-10-CM | POA: Diagnosis not present

## 2020-04-16 DIAGNOSIS — E43 Unspecified severe protein-calorie malnutrition: Secondary | ICD-10-CM | POA: Diagnosis not present

## 2020-04-17 DIAGNOSIS — N3281 Overactive bladder: Secondary | ICD-10-CM | POA: Diagnosis not present

## 2020-04-17 DIAGNOSIS — F0390 Unspecified dementia without behavioral disturbance: Secondary | ICD-10-CM | POA: Diagnosis not present

## 2020-04-17 DIAGNOSIS — J45909 Unspecified asthma, uncomplicated: Secondary | ICD-10-CM | POA: Diagnosis not present

## 2020-04-17 DIAGNOSIS — E43 Unspecified severe protein-calorie malnutrition: Secondary | ICD-10-CM | POA: Diagnosis not present

## 2020-04-17 DIAGNOSIS — I959 Hypotension, unspecified: Secondary | ICD-10-CM | POA: Diagnosis not present

## 2020-04-17 DIAGNOSIS — R1310 Dysphagia, unspecified: Secondary | ICD-10-CM | POA: Diagnosis not present

## 2020-04-22 DIAGNOSIS — R1310 Dysphagia, unspecified: Secondary | ICD-10-CM | POA: Diagnosis not present

## 2020-04-22 DIAGNOSIS — M810 Age-related osteoporosis without current pathological fracture: Secondary | ICD-10-CM | POA: Diagnosis not present

## 2020-04-22 DIAGNOSIS — R159 Full incontinence of feces: Secondary | ICD-10-CM | POA: Diagnosis not present

## 2020-04-22 DIAGNOSIS — F0390 Unspecified dementia without behavioral disturbance: Secondary | ICD-10-CM | POA: Diagnosis not present

## 2020-04-22 DIAGNOSIS — I6529 Occlusion and stenosis of unspecified carotid artery: Secondary | ICD-10-CM | POA: Diagnosis not present

## 2020-04-22 DIAGNOSIS — E039 Hypothyroidism, unspecified: Secondary | ICD-10-CM | POA: Diagnosis not present

## 2020-04-22 DIAGNOSIS — J45909 Unspecified asthma, uncomplicated: Secondary | ICD-10-CM | POA: Diagnosis not present

## 2020-04-22 DIAGNOSIS — Z87440 Personal history of urinary (tract) infections: Secondary | ICD-10-CM | POA: Diagnosis not present

## 2020-04-22 DIAGNOSIS — E43 Unspecified severe protein-calorie malnutrition: Secondary | ICD-10-CM | POA: Diagnosis not present

## 2020-04-22 DIAGNOSIS — R32 Unspecified urinary incontinence: Secondary | ICD-10-CM | POA: Diagnosis not present

## 2020-04-22 DIAGNOSIS — N3281 Overactive bladder: Secondary | ICD-10-CM | POA: Diagnosis not present

## 2020-04-22 DIAGNOSIS — D509 Iron deficiency anemia, unspecified: Secondary | ICD-10-CM | POA: Diagnosis not present

## 2020-04-22 DIAGNOSIS — I959 Hypotension, unspecified: Secondary | ICD-10-CM | POA: Diagnosis not present

## 2020-04-23 DIAGNOSIS — F0390 Unspecified dementia without behavioral disturbance: Secondary | ICD-10-CM | POA: Diagnosis not present

## 2020-04-23 DIAGNOSIS — I959 Hypotension, unspecified: Secondary | ICD-10-CM | POA: Diagnosis not present

## 2020-04-23 DIAGNOSIS — N3281 Overactive bladder: Secondary | ICD-10-CM | POA: Diagnosis not present

## 2020-04-23 DIAGNOSIS — E43 Unspecified severe protein-calorie malnutrition: Secondary | ICD-10-CM | POA: Diagnosis not present

## 2020-04-23 DIAGNOSIS — J45909 Unspecified asthma, uncomplicated: Secondary | ICD-10-CM | POA: Diagnosis not present

## 2020-04-23 DIAGNOSIS — R1310 Dysphagia, unspecified: Secondary | ICD-10-CM | POA: Diagnosis not present

## 2020-05-01 DIAGNOSIS — N3281 Overactive bladder: Secondary | ICD-10-CM | POA: Diagnosis not present

## 2020-05-01 DIAGNOSIS — J45909 Unspecified asthma, uncomplicated: Secondary | ICD-10-CM | POA: Diagnosis not present

## 2020-05-01 DIAGNOSIS — E43 Unspecified severe protein-calorie malnutrition: Secondary | ICD-10-CM | POA: Diagnosis not present

## 2020-05-01 DIAGNOSIS — F0390 Unspecified dementia without behavioral disturbance: Secondary | ICD-10-CM | POA: Diagnosis not present

## 2020-05-01 DIAGNOSIS — I959 Hypotension, unspecified: Secondary | ICD-10-CM | POA: Diagnosis not present

## 2020-05-01 DIAGNOSIS — R1310 Dysphagia, unspecified: Secondary | ICD-10-CM | POA: Diagnosis not present

## 2020-05-02 DIAGNOSIS — R1310 Dysphagia, unspecified: Secondary | ICD-10-CM | POA: Diagnosis not present

## 2020-05-02 DIAGNOSIS — E43 Unspecified severe protein-calorie malnutrition: Secondary | ICD-10-CM | POA: Diagnosis not present

## 2020-05-02 DIAGNOSIS — N3281 Overactive bladder: Secondary | ICD-10-CM | POA: Diagnosis not present

## 2020-05-02 DIAGNOSIS — I959 Hypotension, unspecified: Secondary | ICD-10-CM | POA: Diagnosis not present

## 2020-05-02 DIAGNOSIS — F0390 Unspecified dementia without behavioral disturbance: Secondary | ICD-10-CM | POA: Diagnosis not present

## 2020-05-02 DIAGNOSIS — J45909 Unspecified asthma, uncomplicated: Secondary | ICD-10-CM | POA: Diagnosis not present

## 2020-05-08 DIAGNOSIS — J45909 Unspecified asthma, uncomplicated: Secondary | ICD-10-CM | POA: Diagnosis not present

## 2020-05-08 DIAGNOSIS — E43 Unspecified severe protein-calorie malnutrition: Secondary | ICD-10-CM | POA: Diagnosis not present

## 2020-05-08 DIAGNOSIS — N3281 Overactive bladder: Secondary | ICD-10-CM | POA: Diagnosis not present

## 2020-05-08 DIAGNOSIS — F0390 Unspecified dementia without behavioral disturbance: Secondary | ICD-10-CM | POA: Diagnosis not present

## 2020-05-08 DIAGNOSIS — R1310 Dysphagia, unspecified: Secondary | ICD-10-CM | POA: Diagnosis not present

## 2020-05-08 DIAGNOSIS — I959 Hypotension, unspecified: Secondary | ICD-10-CM | POA: Diagnosis not present

## 2020-05-15 DIAGNOSIS — R1310 Dysphagia, unspecified: Secondary | ICD-10-CM | POA: Diagnosis not present

## 2020-05-15 DIAGNOSIS — J45909 Unspecified asthma, uncomplicated: Secondary | ICD-10-CM | POA: Diagnosis not present

## 2020-05-15 DIAGNOSIS — E43 Unspecified severe protein-calorie malnutrition: Secondary | ICD-10-CM | POA: Diagnosis not present

## 2020-05-15 DIAGNOSIS — N3281 Overactive bladder: Secondary | ICD-10-CM | POA: Diagnosis not present

## 2020-05-15 DIAGNOSIS — I959 Hypotension, unspecified: Secondary | ICD-10-CM | POA: Diagnosis not present

## 2020-05-15 DIAGNOSIS — F0390 Unspecified dementia without behavioral disturbance: Secondary | ICD-10-CM | POA: Diagnosis not present

## 2020-05-22 DIAGNOSIS — N3281 Overactive bladder: Secondary | ICD-10-CM | POA: Diagnosis not present

## 2020-05-22 DIAGNOSIS — E43 Unspecified severe protein-calorie malnutrition: Secondary | ICD-10-CM | POA: Diagnosis not present

## 2020-05-22 DIAGNOSIS — R1310 Dysphagia, unspecified: Secondary | ICD-10-CM | POA: Diagnosis not present

## 2020-05-22 DIAGNOSIS — F0390 Unspecified dementia without behavioral disturbance: Secondary | ICD-10-CM | POA: Diagnosis not present

## 2020-05-22 DIAGNOSIS — I959 Hypotension, unspecified: Secondary | ICD-10-CM | POA: Diagnosis not present

## 2020-05-22 DIAGNOSIS — J45909 Unspecified asthma, uncomplicated: Secondary | ICD-10-CM | POA: Diagnosis not present

## 2020-05-23 DIAGNOSIS — R32 Unspecified urinary incontinence: Secondary | ICD-10-CM | POA: Diagnosis not present

## 2020-05-23 DIAGNOSIS — N3281 Overactive bladder: Secondary | ICD-10-CM | POA: Diagnosis not present

## 2020-05-23 DIAGNOSIS — R159 Full incontinence of feces: Secondary | ICD-10-CM | POA: Diagnosis not present

## 2020-05-23 DIAGNOSIS — J45909 Unspecified asthma, uncomplicated: Secondary | ICD-10-CM | POA: Diagnosis not present

## 2020-05-23 DIAGNOSIS — I959 Hypotension, unspecified: Secondary | ICD-10-CM | POA: Diagnosis not present

## 2020-05-23 DIAGNOSIS — Z87440 Personal history of urinary (tract) infections: Secondary | ICD-10-CM | POA: Diagnosis not present

## 2020-05-23 DIAGNOSIS — E43 Unspecified severe protein-calorie malnutrition: Secondary | ICD-10-CM | POA: Diagnosis not present

## 2020-05-23 DIAGNOSIS — I6529 Occlusion and stenosis of unspecified carotid artery: Secondary | ICD-10-CM | POA: Diagnosis not present

## 2020-05-23 DIAGNOSIS — D509 Iron deficiency anemia, unspecified: Secondary | ICD-10-CM | POA: Diagnosis not present

## 2020-05-23 DIAGNOSIS — E039 Hypothyroidism, unspecified: Secondary | ICD-10-CM | POA: Diagnosis not present

## 2020-05-23 DIAGNOSIS — F0390 Unspecified dementia without behavioral disturbance: Secondary | ICD-10-CM | POA: Diagnosis not present

## 2020-05-23 DIAGNOSIS — M810 Age-related osteoporosis without current pathological fracture: Secondary | ICD-10-CM | POA: Diagnosis not present

## 2020-05-23 DIAGNOSIS — R1310 Dysphagia, unspecified: Secondary | ICD-10-CM | POA: Diagnosis not present

## 2020-05-28 DIAGNOSIS — N3281 Overactive bladder: Secondary | ICD-10-CM | POA: Diagnosis not present

## 2020-05-28 DIAGNOSIS — E43 Unspecified severe protein-calorie malnutrition: Secondary | ICD-10-CM | POA: Diagnosis not present

## 2020-05-28 DIAGNOSIS — J45909 Unspecified asthma, uncomplicated: Secondary | ICD-10-CM | POA: Diagnosis not present

## 2020-05-28 DIAGNOSIS — F0390 Unspecified dementia without behavioral disturbance: Secondary | ICD-10-CM | POA: Diagnosis not present

## 2020-05-28 DIAGNOSIS — R1310 Dysphagia, unspecified: Secondary | ICD-10-CM | POA: Diagnosis not present

## 2020-05-28 DIAGNOSIS — I959 Hypotension, unspecified: Secondary | ICD-10-CM | POA: Diagnosis not present

## 2020-05-29 DIAGNOSIS — J45909 Unspecified asthma, uncomplicated: Secondary | ICD-10-CM | POA: Diagnosis not present

## 2020-05-29 DIAGNOSIS — I959 Hypotension, unspecified: Secondary | ICD-10-CM | POA: Diagnosis not present

## 2020-05-29 DIAGNOSIS — N3281 Overactive bladder: Secondary | ICD-10-CM | POA: Diagnosis not present

## 2020-05-29 DIAGNOSIS — F0390 Unspecified dementia without behavioral disturbance: Secondary | ICD-10-CM | POA: Diagnosis not present

## 2020-05-29 DIAGNOSIS — R1310 Dysphagia, unspecified: Secondary | ICD-10-CM | POA: Diagnosis not present

## 2020-05-29 DIAGNOSIS — E43 Unspecified severe protein-calorie malnutrition: Secondary | ICD-10-CM | POA: Diagnosis not present

## 2020-05-30 DIAGNOSIS — J45909 Unspecified asthma, uncomplicated: Secondary | ICD-10-CM | POA: Diagnosis not present

## 2020-05-30 DIAGNOSIS — R1310 Dysphagia, unspecified: Secondary | ICD-10-CM | POA: Diagnosis not present

## 2020-05-30 DIAGNOSIS — I959 Hypotension, unspecified: Secondary | ICD-10-CM | POA: Diagnosis not present

## 2020-05-30 DIAGNOSIS — E43 Unspecified severe protein-calorie malnutrition: Secondary | ICD-10-CM | POA: Diagnosis not present

## 2020-05-30 DIAGNOSIS — F0390 Unspecified dementia without behavioral disturbance: Secondary | ICD-10-CM | POA: Diagnosis not present

## 2020-05-30 DIAGNOSIS — N3281 Overactive bladder: Secondary | ICD-10-CM | POA: Diagnosis not present

## 2020-06-04 DIAGNOSIS — E43 Unspecified severe protein-calorie malnutrition: Secondary | ICD-10-CM | POA: Diagnosis not present

## 2020-06-04 DIAGNOSIS — J45909 Unspecified asthma, uncomplicated: Secondary | ICD-10-CM | POA: Diagnosis not present

## 2020-06-04 DIAGNOSIS — I959 Hypotension, unspecified: Secondary | ICD-10-CM | POA: Diagnosis not present

## 2020-06-04 DIAGNOSIS — N3281 Overactive bladder: Secondary | ICD-10-CM | POA: Diagnosis not present

## 2020-06-04 DIAGNOSIS — R1310 Dysphagia, unspecified: Secondary | ICD-10-CM | POA: Diagnosis not present

## 2020-06-04 DIAGNOSIS — F0390 Unspecified dementia without behavioral disturbance: Secondary | ICD-10-CM | POA: Diagnosis not present

## 2020-06-05 DIAGNOSIS — F0390 Unspecified dementia without behavioral disturbance: Secondary | ICD-10-CM | POA: Diagnosis not present

## 2020-06-05 DIAGNOSIS — N3281 Overactive bladder: Secondary | ICD-10-CM | POA: Diagnosis not present

## 2020-06-05 DIAGNOSIS — J45909 Unspecified asthma, uncomplicated: Secondary | ICD-10-CM | POA: Diagnosis not present

## 2020-06-05 DIAGNOSIS — E43 Unspecified severe protein-calorie malnutrition: Secondary | ICD-10-CM | POA: Diagnosis not present

## 2020-06-05 DIAGNOSIS — R1310 Dysphagia, unspecified: Secondary | ICD-10-CM | POA: Diagnosis not present

## 2020-06-05 DIAGNOSIS — I959 Hypotension, unspecified: Secondary | ICD-10-CM | POA: Diagnosis not present

## 2020-06-08 DIAGNOSIS — E43 Unspecified severe protein-calorie malnutrition: Secondary | ICD-10-CM | POA: Diagnosis not present

## 2020-06-08 DIAGNOSIS — F0390 Unspecified dementia without behavioral disturbance: Secondary | ICD-10-CM | POA: Diagnosis not present

## 2020-06-08 DIAGNOSIS — N3281 Overactive bladder: Secondary | ICD-10-CM | POA: Diagnosis not present

## 2020-06-08 DIAGNOSIS — J45909 Unspecified asthma, uncomplicated: Secondary | ICD-10-CM | POA: Diagnosis not present

## 2020-06-08 DIAGNOSIS — I959 Hypotension, unspecified: Secondary | ICD-10-CM | POA: Diagnosis not present

## 2020-06-08 DIAGNOSIS — R1310 Dysphagia, unspecified: Secondary | ICD-10-CM | POA: Diagnosis not present

## 2020-06-09 DIAGNOSIS — I959 Hypotension, unspecified: Secondary | ICD-10-CM | POA: Diagnosis not present

## 2020-06-09 DIAGNOSIS — J45909 Unspecified asthma, uncomplicated: Secondary | ICD-10-CM | POA: Diagnosis not present

## 2020-06-09 DIAGNOSIS — E43 Unspecified severe protein-calorie malnutrition: Secondary | ICD-10-CM | POA: Diagnosis not present

## 2020-06-09 DIAGNOSIS — N3281 Overactive bladder: Secondary | ICD-10-CM | POA: Diagnosis not present

## 2020-06-09 DIAGNOSIS — F0390 Unspecified dementia without behavioral disturbance: Secondary | ICD-10-CM | POA: Diagnosis not present

## 2020-06-09 DIAGNOSIS — R1310 Dysphagia, unspecified: Secondary | ICD-10-CM | POA: Diagnosis not present

## 2020-06-11 DIAGNOSIS — N3281 Overactive bladder: Secondary | ICD-10-CM | POA: Diagnosis not present

## 2020-06-11 DIAGNOSIS — I959 Hypotension, unspecified: Secondary | ICD-10-CM | POA: Diagnosis not present

## 2020-06-11 DIAGNOSIS — R1310 Dysphagia, unspecified: Secondary | ICD-10-CM | POA: Diagnosis not present

## 2020-06-11 DIAGNOSIS — E43 Unspecified severe protein-calorie malnutrition: Secondary | ICD-10-CM | POA: Diagnosis not present

## 2020-06-11 DIAGNOSIS — F0390 Unspecified dementia without behavioral disturbance: Secondary | ICD-10-CM | POA: Diagnosis not present

## 2020-06-11 DIAGNOSIS — J45909 Unspecified asthma, uncomplicated: Secondary | ICD-10-CM | POA: Diagnosis not present

## 2020-06-13 DIAGNOSIS — E43 Unspecified severe protein-calorie malnutrition: Secondary | ICD-10-CM | POA: Diagnosis not present

## 2020-06-13 DIAGNOSIS — R1310 Dysphagia, unspecified: Secondary | ICD-10-CM | POA: Diagnosis not present

## 2020-06-13 DIAGNOSIS — N3281 Overactive bladder: Secondary | ICD-10-CM | POA: Diagnosis not present

## 2020-06-13 DIAGNOSIS — F0390 Unspecified dementia without behavioral disturbance: Secondary | ICD-10-CM | POA: Diagnosis not present

## 2020-06-13 DIAGNOSIS — J45909 Unspecified asthma, uncomplicated: Secondary | ICD-10-CM | POA: Diagnosis not present

## 2020-06-13 DIAGNOSIS — I959 Hypotension, unspecified: Secondary | ICD-10-CM | POA: Diagnosis not present

## 2020-06-14 DIAGNOSIS — R1310 Dysphagia, unspecified: Secondary | ICD-10-CM | POA: Diagnosis not present

## 2020-06-14 DIAGNOSIS — E43 Unspecified severe protein-calorie malnutrition: Secondary | ICD-10-CM | POA: Diagnosis not present

## 2020-06-14 DIAGNOSIS — N3281 Overactive bladder: Secondary | ICD-10-CM | POA: Diagnosis not present

## 2020-06-14 DIAGNOSIS — J45909 Unspecified asthma, uncomplicated: Secondary | ICD-10-CM | POA: Diagnosis not present

## 2020-06-14 DIAGNOSIS — F0390 Unspecified dementia without behavioral disturbance: Secondary | ICD-10-CM | POA: Diagnosis not present

## 2020-06-14 DIAGNOSIS — I959 Hypotension, unspecified: Secondary | ICD-10-CM | POA: Diagnosis not present

## 2020-06-18 DIAGNOSIS — R1310 Dysphagia, unspecified: Secondary | ICD-10-CM | POA: Diagnosis not present

## 2020-06-18 DIAGNOSIS — F0390 Unspecified dementia without behavioral disturbance: Secondary | ICD-10-CM | POA: Diagnosis not present

## 2020-06-18 DIAGNOSIS — E43 Unspecified severe protein-calorie malnutrition: Secondary | ICD-10-CM | POA: Diagnosis not present

## 2020-06-18 DIAGNOSIS — N3281 Overactive bladder: Secondary | ICD-10-CM | POA: Diagnosis not present

## 2020-06-18 DIAGNOSIS — I959 Hypotension, unspecified: Secondary | ICD-10-CM | POA: Diagnosis not present

## 2020-06-18 DIAGNOSIS — J45909 Unspecified asthma, uncomplicated: Secondary | ICD-10-CM | POA: Diagnosis not present

## 2020-06-22 DIAGNOSIS — J45909 Unspecified asthma, uncomplicated: Secondary | ICD-10-CM | POA: Diagnosis not present

## 2020-06-22 DIAGNOSIS — I6529 Occlusion and stenosis of unspecified carotid artery: Secondary | ICD-10-CM | POA: Diagnosis not present

## 2020-06-22 DIAGNOSIS — R159 Full incontinence of feces: Secondary | ICD-10-CM | POA: Diagnosis not present

## 2020-06-22 DIAGNOSIS — F0390 Unspecified dementia without behavioral disturbance: Secondary | ICD-10-CM | POA: Diagnosis not present

## 2020-06-22 DIAGNOSIS — I959 Hypotension, unspecified: Secondary | ICD-10-CM | POA: Diagnosis not present

## 2020-06-22 DIAGNOSIS — N3281 Overactive bladder: Secondary | ICD-10-CM | POA: Diagnosis not present

## 2020-06-22 DIAGNOSIS — E43 Unspecified severe protein-calorie malnutrition: Secondary | ICD-10-CM | POA: Diagnosis not present

## 2020-06-22 DIAGNOSIS — M810 Age-related osteoporosis without current pathological fracture: Secondary | ICD-10-CM | POA: Diagnosis not present

## 2020-06-22 DIAGNOSIS — D509 Iron deficiency anemia, unspecified: Secondary | ICD-10-CM | POA: Diagnosis not present

## 2020-06-22 DIAGNOSIS — R1310 Dysphagia, unspecified: Secondary | ICD-10-CM | POA: Diagnosis not present

## 2020-06-22 DIAGNOSIS — Z741 Need for assistance with personal care: Secondary | ICD-10-CM | POA: Diagnosis not present

## 2020-06-22 DIAGNOSIS — R32 Unspecified urinary incontinence: Secondary | ICD-10-CM | POA: Diagnosis not present

## 2020-06-22 DIAGNOSIS — Z87440 Personal history of urinary (tract) infections: Secondary | ICD-10-CM | POA: Diagnosis not present

## 2020-06-22 DIAGNOSIS — E039 Hypothyroidism, unspecified: Secondary | ICD-10-CM | POA: Diagnosis not present

## 2020-06-26 DIAGNOSIS — I959 Hypotension, unspecified: Secondary | ICD-10-CM | POA: Diagnosis not present

## 2020-06-26 DIAGNOSIS — J45909 Unspecified asthma, uncomplicated: Secondary | ICD-10-CM | POA: Diagnosis not present

## 2020-06-26 DIAGNOSIS — N3281 Overactive bladder: Secondary | ICD-10-CM | POA: Diagnosis not present

## 2020-06-26 DIAGNOSIS — E43 Unspecified severe protein-calorie malnutrition: Secondary | ICD-10-CM | POA: Diagnosis not present

## 2020-06-26 DIAGNOSIS — F0390 Unspecified dementia without behavioral disturbance: Secondary | ICD-10-CM | POA: Diagnosis not present

## 2020-06-26 DIAGNOSIS — R1310 Dysphagia, unspecified: Secondary | ICD-10-CM | POA: Diagnosis not present

## 2020-06-27 DIAGNOSIS — R1310 Dysphagia, unspecified: Secondary | ICD-10-CM | POA: Diagnosis not present

## 2020-06-27 DIAGNOSIS — E43 Unspecified severe protein-calorie malnutrition: Secondary | ICD-10-CM | POA: Diagnosis not present

## 2020-06-27 DIAGNOSIS — I959 Hypotension, unspecified: Secondary | ICD-10-CM | POA: Diagnosis not present

## 2020-06-27 DIAGNOSIS — F0390 Unspecified dementia without behavioral disturbance: Secondary | ICD-10-CM | POA: Diagnosis not present

## 2020-06-27 DIAGNOSIS — N3281 Overactive bladder: Secondary | ICD-10-CM | POA: Diagnosis not present

## 2020-06-27 DIAGNOSIS — J45909 Unspecified asthma, uncomplicated: Secondary | ICD-10-CM | POA: Diagnosis not present

## 2020-07-02 DIAGNOSIS — E43 Unspecified severe protein-calorie malnutrition: Secondary | ICD-10-CM | POA: Diagnosis not present

## 2020-07-02 DIAGNOSIS — J45909 Unspecified asthma, uncomplicated: Secondary | ICD-10-CM | POA: Diagnosis not present

## 2020-07-02 DIAGNOSIS — R1310 Dysphagia, unspecified: Secondary | ICD-10-CM | POA: Diagnosis not present

## 2020-07-02 DIAGNOSIS — I959 Hypotension, unspecified: Secondary | ICD-10-CM | POA: Diagnosis not present

## 2020-07-02 DIAGNOSIS — F0390 Unspecified dementia without behavioral disturbance: Secondary | ICD-10-CM | POA: Diagnosis not present

## 2020-07-02 DIAGNOSIS — N3281 Overactive bladder: Secondary | ICD-10-CM | POA: Diagnosis not present

## 2020-07-04 DIAGNOSIS — E43 Unspecified severe protein-calorie malnutrition: Secondary | ICD-10-CM | POA: Diagnosis not present

## 2020-07-04 DIAGNOSIS — F0390 Unspecified dementia without behavioral disturbance: Secondary | ICD-10-CM | POA: Diagnosis not present

## 2020-07-04 DIAGNOSIS — N3281 Overactive bladder: Secondary | ICD-10-CM | POA: Diagnosis not present

## 2020-07-04 DIAGNOSIS — I959 Hypotension, unspecified: Secondary | ICD-10-CM | POA: Diagnosis not present

## 2020-07-04 DIAGNOSIS — J45909 Unspecified asthma, uncomplicated: Secondary | ICD-10-CM | POA: Diagnosis not present

## 2020-07-04 DIAGNOSIS — R1310 Dysphagia, unspecified: Secondary | ICD-10-CM | POA: Diagnosis not present

## 2020-07-09 DIAGNOSIS — F0390 Unspecified dementia without behavioral disturbance: Secondary | ICD-10-CM | POA: Diagnosis not present

## 2020-07-09 DIAGNOSIS — N3281 Overactive bladder: Secondary | ICD-10-CM | POA: Diagnosis not present

## 2020-07-09 DIAGNOSIS — I959 Hypotension, unspecified: Secondary | ICD-10-CM | POA: Diagnosis not present

## 2020-07-09 DIAGNOSIS — E43 Unspecified severe protein-calorie malnutrition: Secondary | ICD-10-CM | POA: Diagnosis not present

## 2020-07-09 DIAGNOSIS — R1310 Dysphagia, unspecified: Secondary | ICD-10-CM | POA: Diagnosis not present

## 2020-07-09 DIAGNOSIS — J45909 Unspecified asthma, uncomplicated: Secondary | ICD-10-CM | POA: Diagnosis not present

## 2020-07-16 DIAGNOSIS — J45909 Unspecified asthma, uncomplicated: Secondary | ICD-10-CM | POA: Diagnosis not present

## 2020-07-16 DIAGNOSIS — E43 Unspecified severe protein-calorie malnutrition: Secondary | ICD-10-CM | POA: Diagnosis not present

## 2020-07-16 DIAGNOSIS — R1310 Dysphagia, unspecified: Secondary | ICD-10-CM | POA: Diagnosis not present

## 2020-07-16 DIAGNOSIS — F0390 Unspecified dementia without behavioral disturbance: Secondary | ICD-10-CM | POA: Diagnosis not present

## 2020-07-16 DIAGNOSIS — N3281 Overactive bladder: Secondary | ICD-10-CM | POA: Diagnosis not present

## 2020-07-16 DIAGNOSIS — I959 Hypotension, unspecified: Secondary | ICD-10-CM | POA: Diagnosis not present

## 2020-07-23 DIAGNOSIS — F0390 Unspecified dementia without behavioral disturbance: Secondary | ICD-10-CM | POA: Diagnosis not present

## 2020-07-23 DIAGNOSIS — M810 Age-related osteoporosis without current pathological fracture: Secondary | ICD-10-CM | POA: Diagnosis not present

## 2020-07-23 DIAGNOSIS — E43 Unspecified severe protein-calorie malnutrition: Secondary | ICD-10-CM | POA: Diagnosis not present

## 2020-07-23 DIAGNOSIS — N3281 Overactive bladder: Secondary | ICD-10-CM | POA: Diagnosis not present

## 2020-07-23 DIAGNOSIS — Z87440 Personal history of urinary (tract) infections: Secondary | ICD-10-CM | POA: Diagnosis not present

## 2020-07-23 DIAGNOSIS — Z741 Need for assistance with personal care: Secondary | ICD-10-CM | POA: Diagnosis not present

## 2020-07-23 DIAGNOSIS — R159 Full incontinence of feces: Secondary | ICD-10-CM | POA: Diagnosis not present

## 2020-07-23 DIAGNOSIS — I6529 Occlusion and stenosis of unspecified carotid artery: Secondary | ICD-10-CM | POA: Diagnosis not present

## 2020-07-23 DIAGNOSIS — R32 Unspecified urinary incontinence: Secondary | ICD-10-CM | POA: Diagnosis not present

## 2020-07-23 DIAGNOSIS — J45909 Unspecified asthma, uncomplicated: Secondary | ICD-10-CM | POA: Diagnosis not present

## 2020-07-23 DIAGNOSIS — D509 Iron deficiency anemia, unspecified: Secondary | ICD-10-CM | POA: Diagnosis not present

## 2020-07-23 DIAGNOSIS — R1310 Dysphagia, unspecified: Secondary | ICD-10-CM | POA: Diagnosis not present

## 2020-07-23 DIAGNOSIS — E039 Hypothyroidism, unspecified: Secondary | ICD-10-CM | POA: Diagnosis not present

## 2020-07-23 DIAGNOSIS — I959 Hypotension, unspecified: Secondary | ICD-10-CM | POA: Diagnosis not present

## 2020-07-31 DIAGNOSIS — I959 Hypotension, unspecified: Secondary | ICD-10-CM | POA: Diagnosis not present

## 2020-07-31 DIAGNOSIS — F0390 Unspecified dementia without behavioral disturbance: Secondary | ICD-10-CM | POA: Diagnosis not present

## 2020-07-31 DIAGNOSIS — R1310 Dysphagia, unspecified: Secondary | ICD-10-CM | POA: Diagnosis not present

## 2020-07-31 DIAGNOSIS — J45909 Unspecified asthma, uncomplicated: Secondary | ICD-10-CM | POA: Diagnosis not present

## 2020-07-31 DIAGNOSIS — N3281 Overactive bladder: Secondary | ICD-10-CM | POA: Diagnosis not present

## 2020-07-31 DIAGNOSIS — E43 Unspecified severe protein-calorie malnutrition: Secondary | ICD-10-CM | POA: Diagnosis not present

## 2020-08-06 DIAGNOSIS — R1310 Dysphagia, unspecified: Secondary | ICD-10-CM | POA: Diagnosis not present

## 2020-08-06 DIAGNOSIS — I959 Hypotension, unspecified: Secondary | ICD-10-CM | POA: Diagnosis not present

## 2020-08-06 DIAGNOSIS — N3281 Overactive bladder: Secondary | ICD-10-CM | POA: Diagnosis not present

## 2020-08-06 DIAGNOSIS — E43 Unspecified severe protein-calorie malnutrition: Secondary | ICD-10-CM | POA: Diagnosis not present

## 2020-08-06 DIAGNOSIS — J45909 Unspecified asthma, uncomplicated: Secondary | ICD-10-CM | POA: Diagnosis not present

## 2020-08-06 DIAGNOSIS — F0390 Unspecified dementia without behavioral disturbance: Secondary | ICD-10-CM | POA: Diagnosis not present

## 2020-08-09 DIAGNOSIS — I959 Hypotension, unspecified: Secondary | ICD-10-CM | POA: Diagnosis not present

## 2020-08-09 DIAGNOSIS — R1310 Dysphagia, unspecified: Secondary | ICD-10-CM | POA: Diagnosis not present

## 2020-08-09 DIAGNOSIS — F0390 Unspecified dementia without behavioral disturbance: Secondary | ICD-10-CM | POA: Diagnosis not present

## 2020-08-09 DIAGNOSIS — E43 Unspecified severe protein-calorie malnutrition: Secondary | ICD-10-CM | POA: Diagnosis not present

## 2020-08-09 DIAGNOSIS — J45909 Unspecified asthma, uncomplicated: Secondary | ICD-10-CM | POA: Diagnosis not present

## 2020-08-09 DIAGNOSIS — N3281 Overactive bladder: Secondary | ICD-10-CM | POA: Diagnosis not present

## 2020-08-13 DIAGNOSIS — E43 Unspecified severe protein-calorie malnutrition: Secondary | ICD-10-CM | POA: Diagnosis not present

## 2020-08-13 DIAGNOSIS — R1310 Dysphagia, unspecified: Secondary | ICD-10-CM | POA: Diagnosis not present

## 2020-08-13 DIAGNOSIS — N3281 Overactive bladder: Secondary | ICD-10-CM | POA: Diagnosis not present

## 2020-08-13 DIAGNOSIS — J45909 Unspecified asthma, uncomplicated: Secondary | ICD-10-CM | POA: Diagnosis not present

## 2020-08-13 DIAGNOSIS — I959 Hypotension, unspecified: Secondary | ICD-10-CM | POA: Diagnosis not present

## 2020-08-13 DIAGNOSIS — F0390 Unspecified dementia without behavioral disturbance: Secondary | ICD-10-CM | POA: Diagnosis not present

## 2020-08-20 DIAGNOSIS — N3281 Overactive bladder: Secondary | ICD-10-CM | POA: Diagnosis not present

## 2020-08-20 DIAGNOSIS — J45909 Unspecified asthma, uncomplicated: Secondary | ICD-10-CM | POA: Diagnosis not present

## 2020-08-20 DIAGNOSIS — F0390 Unspecified dementia without behavioral disturbance: Secondary | ICD-10-CM | POA: Diagnosis not present

## 2020-08-20 DIAGNOSIS — E43 Unspecified severe protein-calorie malnutrition: Secondary | ICD-10-CM | POA: Diagnosis not present

## 2020-08-20 DIAGNOSIS — R1310 Dysphagia, unspecified: Secondary | ICD-10-CM | POA: Diagnosis not present

## 2020-08-20 DIAGNOSIS — I959 Hypotension, unspecified: Secondary | ICD-10-CM | POA: Diagnosis not present

## 2020-08-22 DIAGNOSIS — J45909 Unspecified asthma, uncomplicated: Secondary | ICD-10-CM | POA: Diagnosis not present

## 2020-08-22 DIAGNOSIS — R440 Auditory hallucinations: Secondary | ICD-10-CM | POA: Diagnosis not present

## 2020-08-22 DIAGNOSIS — Z87440 Personal history of urinary (tract) infections: Secondary | ICD-10-CM | POA: Diagnosis not present

## 2020-08-22 DIAGNOSIS — R441 Visual hallucinations: Secondary | ICD-10-CM | POA: Diagnosis not present

## 2020-08-22 DIAGNOSIS — M810 Age-related osteoporosis without current pathological fracture: Secondary | ICD-10-CM | POA: Diagnosis not present

## 2020-08-22 DIAGNOSIS — D509 Iron deficiency anemia, unspecified: Secondary | ICD-10-CM | POA: Diagnosis not present

## 2020-08-22 DIAGNOSIS — N3281 Overactive bladder: Secondary | ICD-10-CM | POA: Diagnosis not present

## 2020-08-22 DIAGNOSIS — R1310 Dysphagia, unspecified: Secondary | ICD-10-CM | POA: Diagnosis not present

## 2020-08-22 DIAGNOSIS — I6529 Occlusion and stenosis of unspecified carotid artery: Secondary | ICD-10-CM | POA: Diagnosis not present

## 2020-08-22 DIAGNOSIS — E039 Hypothyroidism, unspecified: Secondary | ICD-10-CM | POA: Diagnosis not present

## 2020-08-22 DIAGNOSIS — E43 Unspecified severe protein-calorie malnutrition: Secondary | ICD-10-CM | POA: Diagnosis not present

## 2020-08-22 DIAGNOSIS — R159 Full incontinence of feces: Secondary | ICD-10-CM | POA: Diagnosis not present

## 2020-08-22 DIAGNOSIS — I959 Hypotension, unspecified: Secondary | ICD-10-CM | POA: Diagnosis not present

## 2020-08-22 DIAGNOSIS — Z741 Need for assistance with personal care: Secondary | ICD-10-CM | POA: Diagnosis not present

## 2020-08-22 DIAGNOSIS — R32 Unspecified urinary incontinence: Secondary | ICD-10-CM | POA: Diagnosis not present

## 2020-08-22 DIAGNOSIS — F0390 Unspecified dementia without behavioral disturbance: Secondary | ICD-10-CM | POA: Diagnosis not present

## 2020-08-27 DIAGNOSIS — E43 Unspecified severe protein-calorie malnutrition: Secondary | ICD-10-CM | POA: Diagnosis not present

## 2020-08-27 DIAGNOSIS — J45909 Unspecified asthma, uncomplicated: Secondary | ICD-10-CM | POA: Diagnosis not present

## 2020-08-27 DIAGNOSIS — F0390 Unspecified dementia without behavioral disturbance: Secondary | ICD-10-CM | POA: Diagnosis not present

## 2020-08-27 DIAGNOSIS — I959 Hypotension, unspecified: Secondary | ICD-10-CM | POA: Diagnosis not present

## 2020-08-27 DIAGNOSIS — N3281 Overactive bladder: Secondary | ICD-10-CM | POA: Diagnosis not present

## 2020-08-27 DIAGNOSIS — R1310 Dysphagia, unspecified: Secondary | ICD-10-CM | POA: Diagnosis not present

## 2020-09-03 DIAGNOSIS — N3281 Overactive bladder: Secondary | ICD-10-CM | POA: Diagnosis not present

## 2020-09-03 DIAGNOSIS — F0390 Unspecified dementia without behavioral disturbance: Secondary | ICD-10-CM | POA: Diagnosis not present

## 2020-09-03 DIAGNOSIS — R1310 Dysphagia, unspecified: Secondary | ICD-10-CM | POA: Diagnosis not present

## 2020-09-03 DIAGNOSIS — J45909 Unspecified asthma, uncomplicated: Secondary | ICD-10-CM | POA: Diagnosis not present

## 2020-09-03 DIAGNOSIS — E43 Unspecified severe protein-calorie malnutrition: Secondary | ICD-10-CM | POA: Diagnosis not present

## 2020-09-03 DIAGNOSIS — I959 Hypotension, unspecified: Secondary | ICD-10-CM | POA: Diagnosis not present

## 2020-09-11 DIAGNOSIS — I959 Hypotension, unspecified: Secondary | ICD-10-CM | POA: Diagnosis not present

## 2020-09-11 DIAGNOSIS — E43 Unspecified severe protein-calorie malnutrition: Secondary | ICD-10-CM | POA: Diagnosis not present

## 2020-09-11 DIAGNOSIS — F0390 Unspecified dementia without behavioral disturbance: Secondary | ICD-10-CM | POA: Diagnosis not present

## 2020-09-11 DIAGNOSIS — N3281 Overactive bladder: Secondary | ICD-10-CM | POA: Diagnosis not present

## 2020-09-11 DIAGNOSIS — R1310 Dysphagia, unspecified: Secondary | ICD-10-CM | POA: Diagnosis not present

## 2020-09-11 DIAGNOSIS — J45909 Unspecified asthma, uncomplicated: Secondary | ICD-10-CM | POA: Diagnosis not present

## 2020-09-17 DIAGNOSIS — J45909 Unspecified asthma, uncomplicated: Secondary | ICD-10-CM | POA: Diagnosis not present

## 2020-09-17 DIAGNOSIS — R1310 Dysphagia, unspecified: Secondary | ICD-10-CM | POA: Diagnosis not present

## 2020-09-17 DIAGNOSIS — F0390 Unspecified dementia without behavioral disturbance: Secondary | ICD-10-CM | POA: Diagnosis not present

## 2020-09-17 DIAGNOSIS — N3281 Overactive bladder: Secondary | ICD-10-CM | POA: Diagnosis not present

## 2020-09-17 DIAGNOSIS — I959 Hypotension, unspecified: Secondary | ICD-10-CM | POA: Diagnosis not present

## 2020-09-17 DIAGNOSIS — E43 Unspecified severe protein-calorie malnutrition: Secondary | ICD-10-CM | POA: Diagnosis not present

## 2020-09-22 DIAGNOSIS — R159 Full incontinence of feces: Secondary | ICD-10-CM | POA: Diagnosis not present

## 2020-09-22 DIAGNOSIS — R1310 Dysphagia, unspecified: Secondary | ICD-10-CM | POA: Diagnosis not present

## 2020-09-22 DIAGNOSIS — R441 Visual hallucinations: Secondary | ICD-10-CM | POA: Diagnosis not present

## 2020-09-22 DIAGNOSIS — N3281 Overactive bladder: Secondary | ICD-10-CM | POA: Diagnosis not present

## 2020-09-22 DIAGNOSIS — R32 Unspecified urinary incontinence: Secondary | ICD-10-CM | POA: Diagnosis not present

## 2020-09-22 DIAGNOSIS — M810 Age-related osteoporosis without current pathological fracture: Secondary | ICD-10-CM | POA: Diagnosis not present

## 2020-09-22 DIAGNOSIS — J45909 Unspecified asthma, uncomplicated: Secondary | ICD-10-CM | POA: Diagnosis not present

## 2020-09-22 DIAGNOSIS — I6529 Occlusion and stenosis of unspecified carotid artery: Secondary | ICD-10-CM | POA: Diagnosis not present

## 2020-09-22 DIAGNOSIS — F0390 Unspecified dementia without behavioral disturbance: Secondary | ICD-10-CM | POA: Diagnosis not present

## 2020-09-22 DIAGNOSIS — Z87440 Personal history of urinary (tract) infections: Secondary | ICD-10-CM | POA: Diagnosis not present

## 2020-09-22 DIAGNOSIS — I959 Hypotension, unspecified: Secondary | ICD-10-CM | POA: Diagnosis not present

## 2020-09-22 DIAGNOSIS — D509 Iron deficiency anemia, unspecified: Secondary | ICD-10-CM | POA: Diagnosis not present

## 2020-09-22 DIAGNOSIS — E43 Unspecified severe protein-calorie malnutrition: Secondary | ICD-10-CM | POA: Diagnosis not present

## 2020-09-22 DIAGNOSIS — Z741 Need for assistance with personal care: Secondary | ICD-10-CM | POA: Diagnosis not present

## 2020-09-22 DIAGNOSIS — E039 Hypothyroidism, unspecified: Secondary | ICD-10-CM | POA: Diagnosis not present

## 2020-09-22 DIAGNOSIS — R440 Auditory hallucinations: Secondary | ICD-10-CM | POA: Diagnosis not present

## 2020-10-01 DIAGNOSIS — N3281 Overactive bladder: Secondary | ICD-10-CM | POA: Diagnosis not present

## 2020-10-01 DIAGNOSIS — I959 Hypotension, unspecified: Secondary | ICD-10-CM | POA: Diagnosis not present

## 2020-10-01 DIAGNOSIS — F0390 Unspecified dementia without behavioral disturbance: Secondary | ICD-10-CM | POA: Diagnosis not present

## 2020-10-01 DIAGNOSIS — E43 Unspecified severe protein-calorie malnutrition: Secondary | ICD-10-CM | POA: Diagnosis not present

## 2020-10-01 DIAGNOSIS — J45909 Unspecified asthma, uncomplicated: Secondary | ICD-10-CM | POA: Diagnosis not present

## 2020-10-01 DIAGNOSIS — R1310 Dysphagia, unspecified: Secondary | ICD-10-CM | POA: Diagnosis not present

## 2020-10-04 DIAGNOSIS — R32 Unspecified urinary incontinence: Secondary | ICD-10-CM | POA: Diagnosis not present

## 2020-10-04 DIAGNOSIS — E43 Unspecified severe protein-calorie malnutrition: Secondary | ICD-10-CM | POA: Diagnosis not present

## 2020-10-04 DIAGNOSIS — N3281 Overactive bladder: Secondary | ICD-10-CM | POA: Diagnosis not present

## 2020-10-04 DIAGNOSIS — F0390 Unspecified dementia without behavioral disturbance: Secondary | ICD-10-CM | POA: Diagnosis not present

## 2020-10-04 DIAGNOSIS — I959 Hypotension, unspecified: Secondary | ICD-10-CM | POA: Diagnosis not present

## 2020-10-04 DIAGNOSIS — D509 Iron deficiency anemia, unspecified: Secondary | ICD-10-CM | POA: Diagnosis not present

## 2020-10-04 DIAGNOSIS — R159 Full incontinence of feces: Secondary | ICD-10-CM | POA: Diagnosis not present

## 2020-10-04 DIAGNOSIS — I6529 Occlusion and stenosis of unspecified carotid artery: Secondary | ICD-10-CM | POA: Diagnosis not present

## 2020-10-04 DIAGNOSIS — R440 Auditory hallucinations: Secondary | ICD-10-CM | POA: Diagnosis not present

## 2020-10-04 DIAGNOSIS — R1310 Dysphagia, unspecified: Secondary | ICD-10-CM | POA: Diagnosis not present

## 2020-10-04 DIAGNOSIS — R441 Visual hallucinations: Secondary | ICD-10-CM | POA: Diagnosis not present

## 2020-10-04 DIAGNOSIS — J45909 Unspecified asthma, uncomplicated: Secondary | ICD-10-CM | POA: Diagnosis not present

## 2020-10-12 DIAGNOSIS — F0390 Unspecified dementia without behavioral disturbance: Secondary | ICD-10-CM | POA: Diagnosis not present

## 2020-10-12 DIAGNOSIS — I959 Hypotension, unspecified: Secondary | ICD-10-CM | POA: Diagnosis not present

## 2020-10-12 DIAGNOSIS — E43 Unspecified severe protein-calorie malnutrition: Secondary | ICD-10-CM | POA: Diagnosis not present

## 2020-10-12 DIAGNOSIS — J45909 Unspecified asthma, uncomplicated: Secondary | ICD-10-CM | POA: Diagnosis not present

## 2020-10-12 DIAGNOSIS — N3281 Overactive bladder: Secondary | ICD-10-CM | POA: Diagnosis not present

## 2020-10-12 DIAGNOSIS — R1310 Dysphagia, unspecified: Secondary | ICD-10-CM | POA: Diagnosis not present

## 2020-10-15 DIAGNOSIS — E43 Unspecified severe protein-calorie malnutrition: Secondary | ICD-10-CM | POA: Diagnosis not present

## 2020-10-15 DIAGNOSIS — R1310 Dysphagia, unspecified: Secondary | ICD-10-CM | POA: Diagnosis not present

## 2020-10-15 DIAGNOSIS — I959 Hypotension, unspecified: Secondary | ICD-10-CM | POA: Diagnosis not present

## 2020-10-15 DIAGNOSIS — N3281 Overactive bladder: Secondary | ICD-10-CM | POA: Diagnosis not present

## 2020-10-15 DIAGNOSIS — J45909 Unspecified asthma, uncomplicated: Secondary | ICD-10-CM | POA: Diagnosis not present

## 2020-10-15 DIAGNOSIS — F0390 Unspecified dementia without behavioral disturbance: Secondary | ICD-10-CM | POA: Diagnosis not present

## 2020-10-23 DIAGNOSIS — E039 Hypothyroidism, unspecified: Secondary | ICD-10-CM | POA: Diagnosis not present

## 2020-10-23 DIAGNOSIS — I959 Hypotension, unspecified: Secondary | ICD-10-CM | POA: Diagnosis not present

## 2020-10-23 DIAGNOSIS — R440 Auditory hallucinations: Secondary | ICD-10-CM | POA: Diagnosis not present

## 2020-10-23 DIAGNOSIS — E43 Unspecified severe protein-calorie malnutrition: Secondary | ICD-10-CM | POA: Diagnosis not present

## 2020-10-23 DIAGNOSIS — R441 Visual hallucinations: Secondary | ICD-10-CM | POA: Diagnosis not present

## 2020-10-23 DIAGNOSIS — F0390 Unspecified dementia without behavioral disturbance: Secondary | ICD-10-CM | POA: Diagnosis not present

## 2020-10-23 DIAGNOSIS — D509 Iron deficiency anemia, unspecified: Secondary | ICD-10-CM | POA: Diagnosis not present

## 2020-10-23 DIAGNOSIS — R32 Unspecified urinary incontinence: Secondary | ICD-10-CM | POA: Diagnosis not present

## 2020-10-23 DIAGNOSIS — Z87440 Personal history of urinary (tract) infections: Secondary | ICD-10-CM | POA: Diagnosis not present

## 2020-10-23 DIAGNOSIS — N3281 Overactive bladder: Secondary | ICD-10-CM | POA: Diagnosis not present

## 2020-10-23 DIAGNOSIS — R159 Full incontinence of feces: Secondary | ICD-10-CM | POA: Diagnosis not present

## 2020-10-23 DIAGNOSIS — J45909 Unspecified asthma, uncomplicated: Secondary | ICD-10-CM | POA: Diagnosis not present

## 2020-10-23 DIAGNOSIS — R1310 Dysphagia, unspecified: Secondary | ICD-10-CM | POA: Diagnosis not present

## 2020-10-23 DIAGNOSIS — I6529 Occlusion and stenosis of unspecified carotid artery: Secondary | ICD-10-CM | POA: Diagnosis not present

## 2020-10-23 DIAGNOSIS — M810 Age-related osteoporosis without current pathological fracture: Secondary | ICD-10-CM | POA: Diagnosis not present

## 2020-10-23 DIAGNOSIS — Z741 Need for assistance with personal care: Secondary | ICD-10-CM | POA: Diagnosis not present

## 2020-10-26 DIAGNOSIS — N3281 Overactive bladder: Secondary | ICD-10-CM | POA: Diagnosis not present

## 2020-10-26 DIAGNOSIS — E43 Unspecified severe protein-calorie malnutrition: Secondary | ICD-10-CM | POA: Diagnosis not present

## 2020-10-26 DIAGNOSIS — R1310 Dysphagia, unspecified: Secondary | ICD-10-CM | POA: Diagnosis not present

## 2020-10-26 DIAGNOSIS — F0390 Unspecified dementia without behavioral disturbance: Secondary | ICD-10-CM | POA: Diagnosis not present

## 2020-10-26 DIAGNOSIS — I959 Hypotension, unspecified: Secondary | ICD-10-CM | POA: Diagnosis not present

## 2020-10-26 DIAGNOSIS — J45909 Unspecified asthma, uncomplicated: Secondary | ICD-10-CM | POA: Diagnosis not present

## 2020-10-29 DIAGNOSIS — N3281 Overactive bladder: Secondary | ICD-10-CM | POA: Diagnosis not present

## 2020-10-29 DIAGNOSIS — J45909 Unspecified asthma, uncomplicated: Secondary | ICD-10-CM | POA: Diagnosis not present

## 2020-10-29 DIAGNOSIS — R1310 Dysphagia, unspecified: Secondary | ICD-10-CM | POA: Diagnosis not present

## 2020-10-29 DIAGNOSIS — E43 Unspecified severe protein-calorie malnutrition: Secondary | ICD-10-CM | POA: Diagnosis not present

## 2020-10-29 DIAGNOSIS — I959 Hypotension, unspecified: Secondary | ICD-10-CM | POA: Diagnosis not present

## 2020-10-29 DIAGNOSIS — F0390 Unspecified dementia without behavioral disturbance: Secondary | ICD-10-CM | POA: Diagnosis not present

## 2020-11-05 DIAGNOSIS — N3281 Overactive bladder: Secondary | ICD-10-CM | POA: Diagnosis not present

## 2020-11-05 DIAGNOSIS — E43 Unspecified severe protein-calorie malnutrition: Secondary | ICD-10-CM | POA: Diagnosis not present

## 2020-11-05 DIAGNOSIS — R1310 Dysphagia, unspecified: Secondary | ICD-10-CM | POA: Diagnosis not present

## 2020-11-05 DIAGNOSIS — F0390 Unspecified dementia without behavioral disturbance: Secondary | ICD-10-CM | POA: Diagnosis not present

## 2020-11-05 DIAGNOSIS — J45909 Unspecified asthma, uncomplicated: Secondary | ICD-10-CM | POA: Diagnosis not present

## 2020-11-05 DIAGNOSIS — I959 Hypotension, unspecified: Secondary | ICD-10-CM | POA: Diagnosis not present

## 2020-11-14 DIAGNOSIS — R1310 Dysphagia, unspecified: Secondary | ICD-10-CM | POA: Diagnosis not present

## 2020-11-14 DIAGNOSIS — J45909 Unspecified asthma, uncomplicated: Secondary | ICD-10-CM | POA: Diagnosis not present

## 2020-11-14 DIAGNOSIS — I959 Hypotension, unspecified: Secondary | ICD-10-CM | POA: Diagnosis not present

## 2020-11-14 DIAGNOSIS — E43 Unspecified severe protein-calorie malnutrition: Secondary | ICD-10-CM | POA: Diagnosis not present

## 2020-11-14 DIAGNOSIS — N3281 Overactive bladder: Secondary | ICD-10-CM | POA: Diagnosis not present

## 2020-11-14 DIAGNOSIS — F0390 Unspecified dementia without behavioral disturbance: Secondary | ICD-10-CM | POA: Diagnosis not present

## 2020-11-20 DIAGNOSIS — M810 Age-related osteoporosis without current pathological fracture: Secondary | ICD-10-CM | POA: Diagnosis not present

## 2020-11-20 DIAGNOSIS — N3281 Overactive bladder: Secondary | ICD-10-CM | POA: Diagnosis not present

## 2020-11-20 DIAGNOSIS — R441 Visual hallucinations: Secondary | ICD-10-CM | POA: Diagnosis not present

## 2020-11-20 DIAGNOSIS — R440 Auditory hallucinations: Secondary | ICD-10-CM | POA: Diagnosis not present

## 2020-11-20 DIAGNOSIS — J45909 Unspecified asthma, uncomplicated: Secondary | ICD-10-CM | POA: Diagnosis not present

## 2020-11-20 DIAGNOSIS — Z87440 Personal history of urinary (tract) infections: Secondary | ICD-10-CM | POA: Diagnosis not present

## 2020-11-20 DIAGNOSIS — R159 Full incontinence of feces: Secondary | ICD-10-CM | POA: Diagnosis not present

## 2020-11-20 DIAGNOSIS — D509 Iron deficiency anemia, unspecified: Secondary | ICD-10-CM | POA: Diagnosis not present

## 2020-11-20 DIAGNOSIS — R32 Unspecified urinary incontinence: Secondary | ICD-10-CM | POA: Diagnosis not present

## 2020-11-20 DIAGNOSIS — I959 Hypotension, unspecified: Secondary | ICD-10-CM | POA: Diagnosis not present

## 2020-11-20 DIAGNOSIS — F0390 Unspecified dementia without behavioral disturbance: Secondary | ICD-10-CM | POA: Diagnosis not present

## 2020-11-20 DIAGNOSIS — Z741 Need for assistance with personal care: Secondary | ICD-10-CM | POA: Diagnosis not present

## 2020-11-20 DIAGNOSIS — E43 Unspecified severe protein-calorie malnutrition: Secondary | ICD-10-CM | POA: Diagnosis not present

## 2020-11-20 DIAGNOSIS — I6529 Occlusion and stenosis of unspecified carotid artery: Secondary | ICD-10-CM | POA: Diagnosis not present

## 2020-11-20 DIAGNOSIS — E039 Hypothyroidism, unspecified: Secondary | ICD-10-CM | POA: Diagnosis not present

## 2020-11-20 DIAGNOSIS — R1310 Dysphagia, unspecified: Secondary | ICD-10-CM | POA: Diagnosis not present

## 2020-11-21 DIAGNOSIS — J45909 Unspecified asthma, uncomplicated: Secondary | ICD-10-CM | POA: Diagnosis not present

## 2020-11-21 DIAGNOSIS — I959 Hypotension, unspecified: Secondary | ICD-10-CM | POA: Diagnosis not present

## 2020-11-21 DIAGNOSIS — F0390 Unspecified dementia without behavioral disturbance: Secondary | ICD-10-CM | POA: Diagnosis not present

## 2020-11-21 DIAGNOSIS — R1310 Dysphagia, unspecified: Secondary | ICD-10-CM | POA: Diagnosis not present

## 2020-11-21 DIAGNOSIS — E43 Unspecified severe protein-calorie malnutrition: Secondary | ICD-10-CM | POA: Diagnosis not present

## 2020-11-21 DIAGNOSIS — N3281 Overactive bladder: Secondary | ICD-10-CM | POA: Diagnosis not present

## 2020-11-28 DIAGNOSIS — E43 Unspecified severe protein-calorie malnutrition: Secondary | ICD-10-CM | POA: Diagnosis not present

## 2020-11-28 DIAGNOSIS — N3281 Overactive bladder: Secondary | ICD-10-CM | POA: Diagnosis not present

## 2020-11-28 DIAGNOSIS — R1310 Dysphagia, unspecified: Secondary | ICD-10-CM | POA: Diagnosis not present

## 2020-11-28 DIAGNOSIS — I959 Hypotension, unspecified: Secondary | ICD-10-CM | POA: Diagnosis not present

## 2020-11-28 DIAGNOSIS — F0390 Unspecified dementia without behavioral disturbance: Secondary | ICD-10-CM | POA: Diagnosis not present

## 2020-11-28 DIAGNOSIS — J45909 Unspecified asthma, uncomplicated: Secondary | ICD-10-CM | POA: Diagnosis not present

## 2020-11-30 DIAGNOSIS — N3281 Overactive bladder: Secondary | ICD-10-CM | POA: Diagnosis not present

## 2020-11-30 DIAGNOSIS — R1310 Dysphagia, unspecified: Secondary | ICD-10-CM | POA: Diagnosis not present

## 2020-11-30 DIAGNOSIS — F0390 Unspecified dementia without behavioral disturbance: Secondary | ICD-10-CM | POA: Diagnosis not present

## 2020-11-30 DIAGNOSIS — J45909 Unspecified asthma, uncomplicated: Secondary | ICD-10-CM | POA: Diagnosis not present

## 2020-11-30 DIAGNOSIS — I959 Hypotension, unspecified: Secondary | ICD-10-CM | POA: Diagnosis not present

## 2020-11-30 DIAGNOSIS — E43 Unspecified severe protein-calorie malnutrition: Secondary | ICD-10-CM | POA: Diagnosis not present

## 2020-12-05 DIAGNOSIS — I959 Hypotension, unspecified: Secondary | ICD-10-CM | POA: Diagnosis not present

## 2020-12-05 DIAGNOSIS — N3281 Overactive bladder: Secondary | ICD-10-CM | POA: Diagnosis not present

## 2020-12-05 DIAGNOSIS — J45909 Unspecified asthma, uncomplicated: Secondary | ICD-10-CM | POA: Diagnosis not present

## 2020-12-05 DIAGNOSIS — F0390 Unspecified dementia without behavioral disturbance: Secondary | ICD-10-CM | POA: Diagnosis not present

## 2020-12-05 DIAGNOSIS — E43 Unspecified severe protein-calorie malnutrition: Secondary | ICD-10-CM | POA: Diagnosis not present

## 2020-12-05 DIAGNOSIS — R1310 Dysphagia, unspecified: Secondary | ICD-10-CM | POA: Diagnosis not present

## 2020-12-12 DIAGNOSIS — I959 Hypotension, unspecified: Secondary | ICD-10-CM | POA: Diagnosis not present

## 2020-12-12 DIAGNOSIS — F0390 Unspecified dementia without behavioral disturbance: Secondary | ICD-10-CM | POA: Diagnosis not present

## 2020-12-12 DIAGNOSIS — R1310 Dysphagia, unspecified: Secondary | ICD-10-CM | POA: Diagnosis not present

## 2020-12-12 DIAGNOSIS — N3281 Overactive bladder: Secondary | ICD-10-CM | POA: Diagnosis not present

## 2020-12-12 DIAGNOSIS — J45909 Unspecified asthma, uncomplicated: Secondary | ICD-10-CM | POA: Diagnosis not present

## 2020-12-12 DIAGNOSIS — E43 Unspecified severe protein-calorie malnutrition: Secondary | ICD-10-CM | POA: Diagnosis not present

## 2020-12-13 DIAGNOSIS — J45909 Unspecified asthma, uncomplicated: Secondary | ICD-10-CM | POA: Diagnosis not present

## 2020-12-13 DIAGNOSIS — E43 Unspecified severe protein-calorie malnutrition: Secondary | ICD-10-CM | POA: Diagnosis not present

## 2020-12-13 DIAGNOSIS — N3281 Overactive bladder: Secondary | ICD-10-CM | POA: Diagnosis not present

## 2020-12-13 DIAGNOSIS — R1310 Dysphagia, unspecified: Secondary | ICD-10-CM | POA: Diagnosis not present

## 2020-12-13 DIAGNOSIS — F0390 Unspecified dementia without behavioral disturbance: Secondary | ICD-10-CM | POA: Diagnosis not present

## 2020-12-13 DIAGNOSIS — I959 Hypotension, unspecified: Secondary | ICD-10-CM | POA: Diagnosis not present

## 2020-12-19 DIAGNOSIS — R1310 Dysphagia, unspecified: Secondary | ICD-10-CM | POA: Diagnosis not present

## 2020-12-19 DIAGNOSIS — E43 Unspecified severe protein-calorie malnutrition: Secondary | ICD-10-CM | POA: Diagnosis not present

## 2020-12-19 DIAGNOSIS — F0390 Unspecified dementia without behavioral disturbance: Secondary | ICD-10-CM | POA: Diagnosis not present

## 2020-12-19 DIAGNOSIS — J45909 Unspecified asthma, uncomplicated: Secondary | ICD-10-CM | POA: Diagnosis not present

## 2020-12-19 DIAGNOSIS — N3281 Overactive bladder: Secondary | ICD-10-CM | POA: Diagnosis not present

## 2020-12-19 DIAGNOSIS — I959 Hypotension, unspecified: Secondary | ICD-10-CM | POA: Diagnosis not present

## 2020-12-21 DIAGNOSIS — R159 Full incontinence of feces: Secondary | ICD-10-CM | POA: Diagnosis not present

## 2020-12-21 DIAGNOSIS — D509 Iron deficiency anemia, unspecified: Secondary | ICD-10-CM | POA: Diagnosis not present

## 2020-12-21 DIAGNOSIS — R1310 Dysphagia, unspecified: Secondary | ICD-10-CM | POA: Diagnosis not present

## 2020-12-21 DIAGNOSIS — J45909 Unspecified asthma, uncomplicated: Secondary | ICD-10-CM | POA: Diagnosis not present

## 2020-12-21 DIAGNOSIS — M810 Age-related osteoporosis without current pathological fracture: Secondary | ICD-10-CM | POA: Diagnosis not present

## 2020-12-21 DIAGNOSIS — I6529 Occlusion and stenosis of unspecified carotid artery: Secondary | ICD-10-CM | POA: Diagnosis not present

## 2020-12-21 DIAGNOSIS — R440 Auditory hallucinations: Secondary | ICD-10-CM | POA: Diagnosis not present

## 2020-12-21 DIAGNOSIS — R32 Unspecified urinary incontinence: Secondary | ICD-10-CM | POA: Diagnosis not present

## 2020-12-21 DIAGNOSIS — I959 Hypotension, unspecified: Secondary | ICD-10-CM | POA: Diagnosis not present

## 2020-12-21 DIAGNOSIS — R441 Visual hallucinations: Secondary | ICD-10-CM | POA: Diagnosis not present

## 2020-12-21 DIAGNOSIS — Z87440 Personal history of urinary (tract) infections: Secondary | ICD-10-CM | POA: Diagnosis not present

## 2020-12-21 DIAGNOSIS — F0390 Unspecified dementia without behavioral disturbance: Secondary | ICD-10-CM | POA: Diagnosis not present

## 2020-12-21 DIAGNOSIS — E43 Unspecified severe protein-calorie malnutrition: Secondary | ICD-10-CM | POA: Diagnosis not present

## 2020-12-21 DIAGNOSIS — E039 Hypothyroidism, unspecified: Secondary | ICD-10-CM | POA: Diagnosis not present

## 2020-12-21 DIAGNOSIS — Z741 Need for assistance with personal care: Secondary | ICD-10-CM | POA: Diagnosis not present

## 2020-12-21 DIAGNOSIS — N3281 Overactive bladder: Secondary | ICD-10-CM | POA: Diagnosis not present

## 2020-12-21 DEATH — deceased

## 2020-12-24 DIAGNOSIS — N3281 Overactive bladder: Secondary | ICD-10-CM | POA: Diagnosis not present

## 2020-12-24 DIAGNOSIS — I959 Hypotension, unspecified: Secondary | ICD-10-CM | POA: Diagnosis not present

## 2020-12-24 DIAGNOSIS — F0390 Unspecified dementia without behavioral disturbance: Secondary | ICD-10-CM | POA: Diagnosis not present

## 2020-12-24 DIAGNOSIS — E43 Unspecified severe protein-calorie malnutrition: Secondary | ICD-10-CM | POA: Diagnosis not present

## 2020-12-24 DIAGNOSIS — R1310 Dysphagia, unspecified: Secondary | ICD-10-CM | POA: Diagnosis not present

## 2020-12-24 DIAGNOSIS — J45909 Unspecified asthma, uncomplicated: Secondary | ICD-10-CM | POA: Diagnosis not present

## 2020-12-26 DIAGNOSIS — E43 Unspecified severe protein-calorie malnutrition: Secondary | ICD-10-CM | POA: Diagnosis not present

## 2020-12-26 DIAGNOSIS — R1310 Dysphagia, unspecified: Secondary | ICD-10-CM | POA: Diagnosis not present

## 2020-12-26 DIAGNOSIS — J45909 Unspecified asthma, uncomplicated: Secondary | ICD-10-CM | POA: Diagnosis not present

## 2020-12-26 DIAGNOSIS — N3281 Overactive bladder: Secondary | ICD-10-CM | POA: Diagnosis not present

## 2020-12-26 DIAGNOSIS — F0390 Unspecified dementia without behavioral disturbance: Secondary | ICD-10-CM | POA: Diagnosis not present

## 2020-12-26 DIAGNOSIS — I959 Hypotension, unspecified: Secondary | ICD-10-CM | POA: Diagnosis not present

## 2020-12-28 DIAGNOSIS — E43 Unspecified severe protein-calorie malnutrition: Secondary | ICD-10-CM | POA: Diagnosis not present

## 2020-12-28 DIAGNOSIS — R1310 Dysphagia, unspecified: Secondary | ICD-10-CM | POA: Diagnosis not present

## 2020-12-28 DIAGNOSIS — N3281 Overactive bladder: Secondary | ICD-10-CM | POA: Diagnosis not present

## 2020-12-28 DIAGNOSIS — F0390 Unspecified dementia without behavioral disturbance: Secondary | ICD-10-CM | POA: Diagnosis not present

## 2020-12-28 DIAGNOSIS — I959 Hypotension, unspecified: Secondary | ICD-10-CM | POA: Diagnosis not present

## 2020-12-28 DIAGNOSIS — J45909 Unspecified asthma, uncomplicated: Secondary | ICD-10-CM | POA: Diagnosis not present

## 2021-01-02 DIAGNOSIS — F0390 Unspecified dementia without behavioral disturbance: Secondary | ICD-10-CM | POA: Diagnosis not present

## 2021-01-02 DIAGNOSIS — N3281 Overactive bladder: Secondary | ICD-10-CM | POA: Diagnosis not present

## 2021-01-02 DIAGNOSIS — R1310 Dysphagia, unspecified: Secondary | ICD-10-CM | POA: Diagnosis not present

## 2021-01-02 DIAGNOSIS — J45909 Unspecified asthma, uncomplicated: Secondary | ICD-10-CM | POA: Diagnosis not present

## 2021-01-02 DIAGNOSIS — E43 Unspecified severe protein-calorie malnutrition: Secondary | ICD-10-CM | POA: Diagnosis not present

## 2021-01-02 DIAGNOSIS — I959 Hypotension, unspecified: Secondary | ICD-10-CM | POA: Diagnosis not present

## 2021-01-04 DIAGNOSIS — F0390 Unspecified dementia without behavioral disturbance: Secondary | ICD-10-CM | POA: Diagnosis not present

## 2021-01-04 DIAGNOSIS — I959 Hypotension, unspecified: Secondary | ICD-10-CM | POA: Diagnosis not present

## 2021-01-04 DIAGNOSIS — R1310 Dysphagia, unspecified: Secondary | ICD-10-CM | POA: Diagnosis not present

## 2021-01-04 DIAGNOSIS — J45909 Unspecified asthma, uncomplicated: Secondary | ICD-10-CM | POA: Diagnosis not present

## 2021-01-04 DIAGNOSIS — E43 Unspecified severe protein-calorie malnutrition: Secondary | ICD-10-CM | POA: Diagnosis not present

## 2021-01-04 DIAGNOSIS — N3281 Overactive bladder: Secondary | ICD-10-CM | POA: Diagnosis not present

## 2021-01-09 DIAGNOSIS — I959 Hypotension, unspecified: Secondary | ICD-10-CM | POA: Diagnosis not present

## 2021-01-09 DIAGNOSIS — F0390 Unspecified dementia without behavioral disturbance: Secondary | ICD-10-CM | POA: Diagnosis not present

## 2021-01-09 DIAGNOSIS — N3281 Overactive bladder: Secondary | ICD-10-CM | POA: Diagnosis not present

## 2021-01-09 DIAGNOSIS — J45909 Unspecified asthma, uncomplicated: Secondary | ICD-10-CM | POA: Diagnosis not present

## 2021-01-09 DIAGNOSIS — E43 Unspecified severe protein-calorie malnutrition: Secondary | ICD-10-CM | POA: Diagnosis not present

## 2021-01-09 DIAGNOSIS — R1310 Dysphagia, unspecified: Secondary | ICD-10-CM | POA: Diagnosis not present

## 2021-01-16 DIAGNOSIS — E43 Unspecified severe protein-calorie malnutrition: Secondary | ICD-10-CM | POA: Diagnosis not present

## 2021-01-16 DIAGNOSIS — R1310 Dysphagia, unspecified: Secondary | ICD-10-CM | POA: Diagnosis not present

## 2021-01-16 DIAGNOSIS — I959 Hypotension, unspecified: Secondary | ICD-10-CM | POA: Diagnosis not present

## 2021-01-16 DIAGNOSIS — F0390 Unspecified dementia without behavioral disturbance: Secondary | ICD-10-CM | POA: Diagnosis not present

## 2021-01-16 DIAGNOSIS — J45909 Unspecified asthma, uncomplicated: Secondary | ICD-10-CM | POA: Diagnosis not present

## 2021-01-16 DIAGNOSIS — N3281 Overactive bladder: Secondary | ICD-10-CM | POA: Diagnosis not present

## 2021-01-18 DIAGNOSIS — F0390 Unspecified dementia without behavioral disturbance: Secondary | ICD-10-CM | POA: Diagnosis not present

## 2021-01-18 DIAGNOSIS — N3281 Overactive bladder: Secondary | ICD-10-CM | POA: Diagnosis not present

## 2021-01-18 DIAGNOSIS — I959 Hypotension, unspecified: Secondary | ICD-10-CM | POA: Diagnosis not present

## 2021-01-18 DIAGNOSIS — J45909 Unspecified asthma, uncomplicated: Secondary | ICD-10-CM | POA: Diagnosis not present

## 2021-01-18 DIAGNOSIS — E43 Unspecified severe protein-calorie malnutrition: Secondary | ICD-10-CM | POA: Diagnosis not present

## 2021-01-18 DIAGNOSIS — R1310 Dysphagia, unspecified: Secondary | ICD-10-CM | POA: Diagnosis not present

## 2021-01-20 DIAGNOSIS — R1310 Dysphagia, unspecified: Secondary | ICD-10-CM | POA: Diagnosis not present

## 2021-01-20 DIAGNOSIS — R32 Unspecified urinary incontinence: Secondary | ICD-10-CM | POA: Diagnosis not present

## 2021-01-20 DIAGNOSIS — I6529 Occlusion and stenosis of unspecified carotid artery: Secondary | ICD-10-CM | POA: Diagnosis not present

## 2021-01-20 DIAGNOSIS — Z741 Need for assistance with personal care: Secondary | ICD-10-CM | POA: Diagnosis not present

## 2021-01-20 DIAGNOSIS — R440 Auditory hallucinations: Secondary | ICD-10-CM | POA: Diagnosis not present

## 2021-01-20 DIAGNOSIS — D509 Iron deficiency anemia, unspecified: Secondary | ICD-10-CM | POA: Diagnosis not present

## 2021-01-20 DIAGNOSIS — R441 Visual hallucinations: Secondary | ICD-10-CM | POA: Diagnosis not present

## 2021-01-20 DIAGNOSIS — E43 Unspecified severe protein-calorie malnutrition: Secondary | ICD-10-CM | POA: Diagnosis not present

## 2021-01-20 DIAGNOSIS — F0390 Unspecified dementia without behavioral disturbance: Secondary | ICD-10-CM | POA: Diagnosis not present

## 2021-01-20 DIAGNOSIS — M810 Age-related osteoporosis without current pathological fracture: Secondary | ICD-10-CM | POA: Diagnosis not present

## 2021-01-20 DIAGNOSIS — I959 Hypotension, unspecified: Secondary | ICD-10-CM | POA: Diagnosis not present

## 2021-01-20 DIAGNOSIS — R159 Full incontinence of feces: Secondary | ICD-10-CM | POA: Diagnosis not present

## 2021-01-20 DIAGNOSIS — J45909 Unspecified asthma, uncomplicated: Secondary | ICD-10-CM | POA: Diagnosis not present

## 2021-01-20 DIAGNOSIS — Z87440 Personal history of urinary (tract) infections: Secondary | ICD-10-CM | POA: Diagnosis not present

## 2021-01-20 DIAGNOSIS — N3281 Overactive bladder: Secondary | ICD-10-CM | POA: Diagnosis not present

## 2021-01-20 DIAGNOSIS — E039 Hypothyroidism, unspecified: Secondary | ICD-10-CM | POA: Diagnosis not present

## 2021-01-23 DIAGNOSIS — J45909 Unspecified asthma, uncomplicated: Secondary | ICD-10-CM | POA: Diagnosis not present

## 2021-01-23 DIAGNOSIS — I959 Hypotension, unspecified: Secondary | ICD-10-CM | POA: Diagnosis not present

## 2021-01-23 DIAGNOSIS — R1310 Dysphagia, unspecified: Secondary | ICD-10-CM | POA: Diagnosis not present

## 2021-01-23 DIAGNOSIS — F0390 Unspecified dementia without behavioral disturbance: Secondary | ICD-10-CM | POA: Diagnosis not present

## 2021-01-23 DIAGNOSIS — E43 Unspecified severe protein-calorie malnutrition: Secondary | ICD-10-CM | POA: Diagnosis not present

## 2021-01-23 DIAGNOSIS — N3281 Overactive bladder: Secondary | ICD-10-CM | POA: Diagnosis not present

## 2021-01-30 DIAGNOSIS — I959 Hypotension, unspecified: Secondary | ICD-10-CM | POA: Diagnosis not present

## 2021-01-30 DIAGNOSIS — N3281 Overactive bladder: Secondary | ICD-10-CM | POA: Diagnosis not present

## 2021-01-30 DIAGNOSIS — J45909 Unspecified asthma, uncomplicated: Secondary | ICD-10-CM | POA: Diagnosis not present

## 2021-01-30 DIAGNOSIS — F0390 Unspecified dementia without behavioral disturbance: Secondary | ICD-10-CM | POA: Diagnosis not present

## 2021-01-30 DIAGNOSIS — R1310 Dysphagia, unspecified: Secondary | ICD-10-CM | POA: Diagnosis not present

## 2021-01-30 DIAGNOSIS — E43 Unspecified severe protein-calorie malnutrition: Secondary | ICD-10-CM | POA: Diagnosis not present

## 2021-02-04 DIAGNOSIS — I959 Hypotension, unspecified: Secondary | ICD-10-CM | POA: Diagnosis not present

## 2021-02-04 DIAGNOSIS — N3281 Overactive bladder: Secondary | ICD-10-CM | POA: Diagnosis not present

## 2021-02-04 DIAGNOSIS — F0390 Unspecified dementia without behavioral disturbance: Secondary | ICD-10-CM | POA: Diagnosis not present

## 2021-02-04 DIAGNOSIS — E43 Unspecified severe protein-calorie malnutrition: Secondary | ICD-10-CM | POA: Diagnosis not present

## 2021-02-04 DIAGNOSIS — J45909 Unspecified asthma, uncomplicated: Secondary | ICD-10-CM | POA: Diagnosis not present

## 2021-02-04 DIAGNOSIS — R1310 Dysphagia, unspecified: Secondary | ICD-10-CM | POA: Diagnosis not present

## 2021-02-05 DIAGNOSIS — E43 Unspecified severe protein-calorie malnutrition: Secondary | ICD-10-CM | POA: Diagnosis not present

## 2021-02-05 DIAGNOSIS — F0390 Unspecified dementia without behavioral disturbance: Secondary | ICD-10-CM | POA: Diagnosis not present

## 2021-02-05 DIAGNOSIS — N3281 Overactive bladder: Secondary | ICD-10-CM | POA: Diagnosis not present

## 2021-02-05 DIAGNOSIS — J45909 Unspecified asthma, uncomplicated: Secondary | ICD-10-CM | POA: Diagnosis not present

## 2021-02-05 DIAGNOSIS — I959 Hypotension, unspecified: Secondary | ICD-10-CM | POA: Diagnosis not present

## 2021-02-05 DIAGNOSIS — R1310 Dysphagia, unspecified: Secondary | ICD-10-CM | POA: Diagnosis not present

## 2021-02-09 DIAGNOSIS — E43 Unspecified severe protein-calorie malnutrition: Secondary | ICD-10-CM | POA: Diagnosis not present

## 2021-02-09 DIAGNOSIS — F0390 Unspecified dementia without behavioral disturbance: Secondary | ICD-10-CM | POA: Diagnosis not present

## 2021-02-09 DIAGNOSIS — N3281 Overactive bladder: Secondary | ICD-10-CM | POA: Diagnosis not present

## 2021-02-09 DIAGNOSIS — R1310 Dysphagia, unspecified: Secondary | ICD-10-CM | POA: Diagnosis not present

## 2021-02-09 DIAGNOSIS — J45909 Unspecified asthma, uncomplicated: Secondary | ICD-10-CM | POA: Diagnosis not present

## 2021-02-09 DIAGNOSIS — I959 Hypotension, unspecified: Secondary | ICD-10-CM | POA: Diagnosis not present

## 2021-02-10 DIAGNOSIS — N3281 Overactive bladder: Secondary | ICD-10-CM | POA: Diagnosis not present

## 2021-02-10 DIAGNOSIS — F0390 Unspecified dementia without behavioral disturbance: Secondary | ICD-10-CM | POA: Diagnosis not present

## 2021-02-10 DIAGNOSIS — J45909 Unspecified asthma, uncomplicated: Secondary | ICD-10-CM | POA: Diagnosis not present

## 2021-02-10 DIAGNOSIS — E43 Unspecified severe protein-calorie malnutrition: Secondary | ICD-10-CM | POA: Diagnosis not present

## 2021-02-10 DIAGNOSIS — R1310 Dysphagia, unspecified: Secondary | ICD-10-CM | POA: Diagnosis not present

## 2021-02-10 DIAGNOSIS — I959 Hypotension, unspecified: Secondary | ICD-10-CM | POA: Diagnosis not present

## 2021-02-20 DEATH — deceased
